# Patient Record
Sex: Female | Born: 1990 | Race: White | Hispanic: No | Marital: Single | Smoking: Former smoker
Health system: Southern US, Community
[De-identification: ages and names within clinical notes are randomized; demographics above are authoritative.]

## PROBLEM LIST (undated history)

## (undated) DIAGNOSIS — O1490 Unspecified pre-eclampsia, unspecified trimester: Secondary | ICD-10-CM

## (undated) DIAGNOSIS — M069 Rheumatoid arthritis, unspecified: Secondary | ICD-10-CM

## (undated) HISTORY — DX: Rheumatoid arthritis, unspecified: M06.9

## (undated) HISTORY — DX: Unspecified pre-eclampsia, unspecified trimester: O14.90

---
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
## 2011-07-26 ENCOUNTER — Emergency Department (HOSPITAL_COMMUNITY)
Admission: EM | Admit: 2011-07-26 | Discharge: 2011-07-26 | Disposition: A | Discharge status: Home / Self Care | Payer: BC Managed Care – PPO | Attending: Emergency Medicine | Admitting: Emergency Medicine

## 2011-07-26 ENCOUNTER — Encounter: Payer: Self-pay | Admitting: Emergency Medicine

## 2011-07-26 DIAGNOSIS — T148 Other injury of unspecified body region: Secondary | ICD-10-CM | POA: Insufficient documentation

## 2011-07-26 DIAGNOSIS — W57XXXA Bitten or stung by nonvenomous insect and other nonvenomous arthropods, initial encounter: Secondary | ICD-10-CM | POA: Insufficient documentation

## 2011-07-26 MED ORDER — HYDROCORTISONE 1 % EX CREA: TOPICAL_CREAM | CUTANEOUS | Status: AC

## 2011-07-26 MED ORDER — DIPHENHYDRAMINE HCL 25 MG PO TABS: 25.0000 mg | ORAL_TABLET | Freq: Four times a day (QID) | ORAL | Status: AC

## 2011-07-26 NOTE — ED Notes (Signed)
Pt works in [employer], complaining of bug bites all over x 3 weeks ago.

## 2011-07-26 NOTE — ED Provider Notes (Signed)
History   This chart was scribed for Glynn Octave, MD by Clarita Crane. The patient was seen in room APA19/APA19 and the patient's care was started at 7:37AM.   CSN: [id number] Arrival date & time: 07/26/2011  6:48 AM  Chief Complaint  Patient presents with  . Insect Bite    (Consider location/radiation/quality/duration/timing/severity/associated sxs/prior treatment) HPI Kelly Campbell is a 20 y.o. female who presents to the Emergency Department complaining of diffuse scattered lesions described as insect bites with associated pruritus onset several weeks ago and persistent since. Denies fever, cough, SOB and recent new exposures. Notes lesions and pruritus not relieved with use of benadryl and calamine lotion. Patient reports she lives alone but works in a [employer] with people that have experienced similar symptoms. Reports that she is covered from head to toe at work except for not wearing gloves.   History reviewed. No pertinent past medical history.  History reviewed. No pertinent past surgical history.  No family history on file.  History  Substance Use Topics  . Smoking status: Never Smoker   . Smokeless tobacco: Not on file  . Alcohol Use: No    OB History    Grav Para Term Preterm Abortions TAB SAB Ect Mult Living                  Review of Systems 10 Systems reviewed and are negative for acute change except as noted in the HPI.  Allergies  Penicillins  Home Medications  No current outpatient prescriptions on file.  BP 127/79  Pulse 85  Temp(Src) 98 F (36.7 C) (Oral)  Resp 18  Ht 5\' 7"  (1.702 m)  Wt 164 lb (74.39 kg)  BMI 25.69 kg/m2  SpO2 98%  LMP 07/10/2011  Physical Exam  Nursing note and vitals reviewed. Constitutional: She is oriented to person, place, and time. She appears well-developed and well-nourished. No distress.  HENT:  Head: Normocephalic and atraumatic.  Mouth/Throat: Oropharynx is clear and moist.  Eyes: EOM are normal.  Pupils are equal, round, and reactive to light.  Neck: Neck supple. No tracheal deviation present.  Cardiovascular: Normal rate and regular rhythm.  Exam reveals no friction rub.   No murmur heard. Pulmonary/Chest: Effort normal and breath sounds normal. No respiratory distress. She has no wheezes.  Abdominal: Soft. She exhibits no distension. There is no tenderness.  Musculoskeletal: Normal range of motion. She exhibits no edema.  Neurological: She is alert and oriented to person, place, and time. No sensory deficit.  Skin: Skin is warm and dry.       Diffuse scattered erythematous papules concentrated to feet, hands, back and neck with area of excoriation to left foot. No lesions to web space of bilateral hands.   Psychiatric: She has a normal mood and affect. Her behavior is normal.    ED Course  Procedures (including critical care time)  DIAGNOSTIC STUDIES: Oxygen Saturation is 98% on room air, normal by my interpretation.    COORDINATION OF CARE:   Labs Reviewed - No data to display No results found.   No diagnosis found.    MDM  Scattered erythematous papules with excoriation.  No burrows or tracts or intertriginous areas to suggest scabies.  No mucus membrane or genital involvement. Consistent with insect bites, possible bed bugs or "cotton mite" per patient.  Will treat with antihistamines and steroid cream. Patient instructed to wash clothing and bedding in hot water.    I personally performed the services described in this documentation,  which was scribed in my presence.  The recorded information has been reviewed and considered.    Glynn Octave, MD 07/26/11 [phone]

## 2015-12-25 ENCOUNTER — Encounter: Payer: Self-pay | Admitting: Family Medicine

## 2015-12-25 ENCOUNTER — Ambulatory Visit (INDEPENDENT_AMBULATORY_CARE_PROVIDER_SITE_OTHER): Payer: BLUE CROSS/BLUE SHIELD | Admitting: Family Medicine

## 2015-12-25 VITALS — BP 130/88 | HR 87 | Temp 97.8°F | Ht 67.0 in | Wt 182.8 lb

## 2015-12-25 DIAGNOSIS — Z30018 Encounter for initial prescription of other contraceptives: Secondary | ICD-10-CM | POA: Diagnosis not present

## 2015-12-25 MED ORDER — ETONOGESTREL-ETHINYL ESTRADIOL 0.12-0.015 MG/24HR VA RING: 1.0000 | VAGINAL_RING | VAGINAL | Status: DC

## 2015-12-25 NOTE — Progress Notes (Signed)
BP 130/88 mmHg  Pulse 87  Temp(Src) 97.8 F (36.6 C) (Oral)  Ht 5\' 7"  (1.702 m)  Wt 182 lb 12.8 oz (82.918 kg)  BMI 28.62 kg/m2  LMP 12/23/2015   Subjective:    Patient ID: Kelly Campbell, female    DOB: September 11, 1991, 25 y.o.   MRN: [id number]  HPI: Kelly Campbell is a 25 y.o. female presenting on 12/25/2015 for Establish Care   HPI Birth control continuation and counseling Patient is a 25 year old G1 P1 who presents today for continuation of her birth control and to establish care with [place]. She has recently moved back to the area was getting her NuvaRing from her previous provider. She just finished her last NuvaRing and had her period on Saturday which then finished bleeding on Tuesday. She has been on the NuvaRing for a couple years now since her child was born who is now almost 53 years old. She is very satisfied with the NuvaRing and denies any major side effects from it. She is married currently and does not have any other partners.  Relevant past medical, surgical, family and social history reviewed and updated as indicated. Interim medical history since our last visit reviewed. Allergies and medications reviewed and updated.  Review of Systems  Constitutional: Negative for fever and chills.  HENT: Negative for congestion, ear discharge and ear pain.   Eyes: Negative for redness and visual disturbance.  Respiratory: Negative for cough, chest tightness and shortness of breath.   Cardiovascular: Negative for chest pain, palpitations and leg swelling.  Genitourinary: Negative for dysuria, frequency, difficulty urinating, menstrual problem and pelvic pain.  Musculoskeletal: Negative for back pain and gait problem.  Skin: Negative for rash.  Neurological: Negative for dizziness, light-headedness and headaches.  Psychiatric/Behavioral: Negative for behavioral problems and agitation.  All other systems reviewed and are negative.   Per HPI unless specifically indicated  above  Social History   Social History  . Marital Status: Single    Spouse Name: N/A  . Number of Children: N/A  . Years of Education: N/A   Occupational History  . Not on file.   Social History Main Topics  . Smoking status: Current Every Day Smoker -- 0.50 packs/day for 5 years    Types: Cigarettes  . Smokeless tobacco: Never Used  . Alcohol Use: No  . Drug Use: No  . Sexual Activity: Yes    Birth Control/ Protection: Inserts   Other Topics Concern  . Not on file   Social History Narrative    Past Surgical History  Procedure Laterality Date  . Cesarean section      Family History  Problem Relation Age of Onset  . Diabetes Mother   . Asthma Mother   . Asthma Sister       Medication List       This list is accurate as of: 12/25/15 11:09 AM.  Always use your most recent med list.               etonogestrel-ethinyl estradiol 0.12-0.015 MG/24HR vaginal ring  Commonly known as:  Henderson 1 each vaginally every 28 (twenty-eight) days. Insert vaginally and leave in place for 3 consecutive weeks, then remove for 1 week.           Objective:    BP 130/88 mmHg  Pulse 87  Temp(Src) 97.8 F (36.6 C) (Oral)  Ht 5\' 7"  (1.702 m)  Wt 182 lb 12.8 oz (82.918 kg)  BMI 28.62 kg/m2  LMP  12/23/2015  Wt Readings from Last 3 Encounters:  12/25/15 182 lb 12.8 oz (82.918 kg)  07/26/11 164 lb (74.39 kg) (90 %*, Z = 1.26)   * Growth percentiles are based on CDC 2-20 Years data.    Physical Exam  Constitutional: She is oriented to person, place, and time. She appears well-developed and well-nourished. No distress.  Eyes: Conjunctivae and EOM are normal. Pupils are equal, round, and reactive to light.  Neck: Neck supple. No thyromegaly present.  Cardiovascular: Normal rate, regular rhythm, normal heart sounds and intact distal pulses.   No murmur heard. Pulmonary/Chest: Effort normal and breath sounds normal. No respiratory distress. She has no wheezes.   Abdominal: Soft. Bowel sounds are normal. She exhibits no distension. There is no tenderness. There is no rebound.  Musculoskeletal: Normal range of motion. She exhibits no edema or tenderness.  Lymphadenopathy:    She has no cervical adenopathy.  Neurological: She is alert and oriented to person, place, and time. Coordination normal.  Skin: Skin is warm and dry. No rash noted. She is not diaphoretic.  Psychiatric: She has a normal mood and affect. Her behavior is normal.  Nursing note and vitals reviewed.   Results for orders placed or performed during the hospital encounter of 08/05/10  Urine culture  Result Value Ref Range   Specimen Description URINE, CLEAN CATCH    Special Requests NONE    Culture  Setup Time [id number]    Colony Count >=100,000 COLONIES/ML    Culture      STAPHYLOCOCCUS SPECIES (COAGULASE NEGATIVE) Note: RIFAMPIN AND GENTAMICIN SHOULD NOT BE USED AS SINGLE DRUGS FOR TREATMENT OF STAPH INFECTIONS.   Report Status 08/08/2010 FINAL    Organism ID, Bacteria STAPHYLOCOCCUS SPECIES (COAGULASE NEGATIVE)       Susceptibility   Staphylococcus species (coagulase negative) - MIC    GENTAMICIN <=0.5 Sensitive     LEVOFLOXACIN 0.5 Sensitive     NITROFURANTOIN <=16 Sensitive     OXACILLIN SENSITIVE      PENICILLIN 0.25 Resistant     RIFAMPIN <=0.5 Sensitive     VANCOMYCIN <=0.5 Sensitive     TETRACYCLINE <=1 Sensitive   Pregnancy, urine  Result Value Ref Range   Preg Test, Ur      NEGATIVE        THE SENSITIVITY OF THIS METHODOLOGY IS >24 mIU/mL  Urinalysis, Routine w reflex microscopic  Result Value Ref Range   Color, Urine YELLOW YELLOW   APPearance HAZY (A) CLEAR   Specific Gravity, Urine 1.025 1.005 - 1.030   pH 6.5 5.0 - 8.0   Glucose, UA NEGATIVE NEGATIVE mg/dL   Hgb urine dipstick LARGE (A) NEGATIVE   Bilirubin Urine NEGATIVE NEGATIVE   Ketones, ur NEGATIVE NEGATIVE mg/dL   Protein, ur TRACE (A) NEGATIVE mg/dL   Urobilinogen, UA 1.0 0.0 - 1.0 mg/dL    Nitrite NEGATIVE NEGATIVE   Leukocytes, UA SMALL (A) NEGATIVE  Urine microscopic-add on  Result Value Ref Range   Squamous Epithelial / LPF MANY (A) RARE   WBC, UA 7-10 <3 WBC/hpf   RBC / HPF 21-50 <3 RBC/hpf   Bacteria, UA MANY (A) RARE      Assessment & Plan:   Problem List Items Addressed This Visit    None    Visit Diagnoses    Encounter for initial prescription of other contraceptives    -  Primary    Relevant Medications    etonogestrel-ethinyl estradiol (NUVARING) 0.12-0.015 MG/24HR vaginal ring  Follow up plan: Return in about 4 weeks (around 01/22/2016), or if symptoms worsen or fail to improve.  Caryl Pina, MD [facility] 12/25/2015, 11:09 AM

## 2016-01-08 ENCOUNTER — Encounter: Payer: Self-pay | Admitting: Family Medicine

## 2016-01-08 ENCOUNTER — Ambulatory Visit (INDEPENDENT_AMBULATORY_CARE_PROVIDER_SITE_OTHER): Payer: BLUE CROSS/BLUE SHIELD | Admitting: Family Medicine

## 2016-01-08 VITALS — BP 135/81 | HR 106 | Temp 97.5°F | Ht 67.0 in | Wt 183.8 lb

## 2016-01-08 DIAGNOSIS — J309 Allergic rhinitis, unspecified: Secondary | ICD-10-CM

## 2016-01-08 DIAGNOSIS — Z01419 Encounter for gynecological examination (general) (routine) without abnormal findings: Secondary | ICD-10-CM | POA: Diagnosis not present

## 2016-01-08 MED ORDER — PREDNISONE 20 MG PO TABS: ORAL_TABLET | ORAL | Status: DC

## 2016-01-08 NOTE — Addendum Note (Signed)
Addended by: Caryl Pina on: 01/08/2016 03:45 PM   Modules accepted: Miquel Dunn

## 2016-01-08 NOTE — Progress Notes (Addendum)
BP 135/81 mmHg  Pulse 106  Temp(Src) 97.5 F (36.4 C) (Oral)  Ht 5\' 7"  (1.702 m)  Wt 183 lb 12.8 oz (83.371 kg)  BMI 28.78 kg/m2  LMP 12/23/2015   Subjective:    Patient ID: Kelly Campbell, female    DOB: November 30, 1990, 25 y.o.   MRN: [id number]  HPI: Kelly Campbell is a 25 y.o. female presenting on 01/08/2016 for Gynecologic Exam and Sinusitis   HPI We'll woman exam and Pap Patient is coming in today for well woman exam and Pap. Her last Pap was 3 years ago right after her child was born. Her child was born C-section. She has never had any abnormal Paps. She denies any issues with her breasts such as lumps or discharge. She denies any abnormal vaginal discharge or abnormal vaginal bleeding. She is currently using the NuvaRing for birth control and is stable steady relationship.  Sinus congestion and drainage Patient has been having sinus congestion and drainage for the past 1 week since she will the lawn and was outside more. She has been using Claritin and last been helping some but is not getting rid of it. She does often gets seasonal allergies in the springtime. This is her first spring back in [place] after living up in [place]. She denies any fevers or chills or shortness of breath or wheezing. She has been having some postnasal drainage and sinus pressure and ear pressure mostly all on the right side.  Relevant past medical, surgical, family and social history reviewed and updated as indicated. Interim medical history since our last visit reviewed. Allergies and medications reviewed and updated.  Review of Systems  Constitutional: Negative for fever and chills.  HENT: Positive for congestion, postnasal drip, rhinorrhea, sinus pressure, sneezing and sore throat. Negative for ear discharge and ear pain.   Eyes: Negative for pain, redness and visual disturbance.  Respiratory: Positive for cough. Negative for chest tightness and shortness of breath.   Cardiovascular:  Negative for chest pain and leg swelling.  Genitourinary: Negative for dysuria, frequency, hematuria, flank pain, vaginal bleeding, vaginal discharge, difficulty urinating, vaginal pain, menstrual problem and pelvic pain.  Musculoskeletal: Negative for back pain and gait problem.  Skin: Negative for color change and rash.  Neurological: Negative for dizziness, light-headedness and headaches.  Psychiatric/Behavioral: Negative for behavioral problems and agitation.  All other systems reviewed and are negative.   Per HPI unless specifically indicated above     Medication List       This list is accurate as of: 01/08/16 10:44 AM.  Always use your most recent med list.               etonogestrel-ethinyl estradiol 0.12-0.015 MG/24HR vaginal ring  Commonly known as:  Amanda 1 each vaginally every 28 (twenty-eight) days. Insert vaginally and leave in place for 3 consecutive weeks, then remove for 1 week.     loratadine 10 MG tablet  Commonly known as:  CLARITIN  Take 10 mg by mouth daily.     predniSONE 20 MG tablet  Commonly known as:  DELTASONE  2 po at same time daily for 5 days           Objective:    BP 135/81 mmHg  Pulse 106  Temp(Src) 97.5 F (36.4 C) (Oral)  Ht 5\' 7"  (1.702 m)  Wt 183 lb 12.8 oz (83.371 kg)  BMI 28.78 kg/m2  LMP 12/23/2015  Wt Readings from Last 3 Encounters:  01/08/16 183  lb 12.8 oz (83.371 kg)  12/25/15 182 lb 12.8 oz (82.918 kg)  07/26/11 164 lb (74.39 kg) (90 %*, Z = 1.26)   * Growth percentiles are based on CDC 2-20 Years data.    Physical Exam  Constitutional: She is oriented to person, place, and time. She appears well-developed and well-nourished. No distress.  HENT:  Right Ear: External ear and ear canal normal. Tympanic membrane is retracted. Tympanic membrane is not injected and not erythematous. No middle ear effusion.  Left Ear: Tympanic membrane, external ear and ear canal normal.  Nose: Mucosal edema and rhinorrhea  present. No epistaxis. Right sinus exhibits maxillary sinus tenderness. Right sinus exhibits no frontal sinus tenderness. Left sinus exhibits no maxillary sinus tenderness and no frontal sinus tenderness.  Mouth/Throat: Uvula is midline and mucous membranes are normal. Posterior oropharyngeal edema and posterior oropharyngeal erythema present. No oropharyngeal exudate or tonsillar abscesses.  Eyes: Conjunctivae and EOM are normal. Pupils are equal, round, and reactive to light.  Neck: Neck supple. No thyromegaly present.  Cardiovascular: Normal rate, regular rhythm, normal heart sounds and intact distal pulses.   No murmur heard. Pulmonary/Chest: Effort normal and breath sounds normal. No respiratory distress. She has no wheezes. She exhibits no mass and no tenderness. Right breast exhibits no inverted nipple, no mass, no nipple discharge, no skin change and no tenderness. Left breast exhibits no inverted nipple, no mass, no nipple discharge, no skin change and no tenderness. Breasts are symmetrical.  Genitourinary: Uterus normal. No breast swelling, tenderness, discharge or bleeding. There is no rash, tenderness, lesion or injury on the right labia. There is no rash, tenderness, lesion or injury on the left labia. Uterus is not deviated, not enlarged, not fixed and not tender. Cervix exhibits no motion tenderness, no discharge and no friability (Visible transitional zone). Right adnexum displays no mass, no tenderness and no fullness. Left adnexum displays no mass, no tenderness and no fullness. No erythema or bleeding in the vagina. No foreign body around the vagina. No vaginal discharge found.  Musculoskeletal: Normal range of motion. She exhibits no edema or tenderness.  Lymphadenopathy:    She has no cervical adenopathy.  Neurological: She is alert and oriented to person, place, and time. Coordination normal.  Skin: Skin is warm and dry. No rash noted. She is not diaphoretic.  Psychiatric: She has  a normal mood and affect. Her behavior is normal.  Nursing note and vitals reviewed.     Assessment & Plan:   Problem List Items Addressed This Visit    None    Visit Diagnoses    Well woman exam with routine gynecological exam    -  Primary    Relevant Orders    Pap IG, CT/NG w/ reflex HPV when ASC-U    Allergic rhinitis, unspecified allergic rhinitis type        Continue Claritin and take short course of prednisone. Pickup Flonase    Relevant Medications    predniSONE (DELTASONE) 20 MG tablet       Follow up plan: Return in about 1 year (around 01/07/2017), or if symptoms worsen or fail to improve.  Counseling provided for all of the vaccine components No orders of the defined types were placed in this encounter.    Caryl Pina, MD [facility] 01/08/2016, 10:44 AM

## 2016-01-11 LAB — PAP IG, CT-NG, RFX HPV ASCU
Chlamydia, Nuc. Acid Amp: NEGATIVE
GONOCOCCUS BY NUCLEIC ACID AMP: NEGATIVE
PAP Smear Comment: 0

## 2016-07-22 ENCOUNTER — Ambulatory Visit (INDEPENDENT_AMBULATORY_CARE_PROVIDER_SITE_OTHER): Payer: BLUE CROSS/BLUE SHIELD | Admitting: Physician Assistant

## 2016-07-22 ENCOUNTER — Encounter: Payer: Self-pay | Admitting: Physician Assistant

## 2016-07-22 VITALS — BP 127/89 | HR 106 | Temp 98.5°F | Ht 67.0 in | Wt 193.6 lb

## 2016-07-22 DIAGNOSIS — F4322 Adjustment disorder with anxiety: Secondary | ICD-10-CM | POA: Diagnosis not present

## 2016-07-22 MED ORDER — ALPRAZOLAM 0.5 MG PO TABS: 0.5000 mg | ORAL_TABLET | Freq: Two times a day (BID) | ORAL | 0 refills | Status: DC | PRN

## 2016-07-22 NOTE — Patient Instructions (Signed)
  Adjustment Disorder Adjustment disorder is an unusually severe reaction to a stressful life event, such as the loss of a job or physical illness. The event may be any stressful event other than the loss of a loved one. Adjustment disorder may affect your feelings, your thinking, how you act, or a combination of these. It may interfere with personal relationships or with the way you are at work, school, or home. People with this disorder are at risk for suicide and substance abuse. They may develop a more serious mental disorder, such as major depressive disorder or post-traumatic stress disorder. SIGNS AND SYMPTOMS  Symptoms may include:  Sadness, depressed mood, or crying spells.  Loss of enjoyment.  Change in appetite or weight.  Sense of loss or hopelessness.  Thoughts of suicide.  Anxiety, worry, or nervousness.  Trouble sleeping.  Avoiding family and friends.  Poor school performance.  Fighting or vandalism.  Reckless driving.  Skipping school.  Poor work performance.  Ignoring bills. Symptoms of adjustment disorder start within 3 months of the stressful life event. They do not last more than 6 months after the event has ended. DIAGNOSIS  To make a diagnosis, your health care provider will ask about what has happened in your life and how it has affected you. He or she may also ask about your medical history and use of medicines, alcohol, and other substances. Your health care provider may do a physical exam and order lab tests or other studies. You may be referred to a mental health specialist for evaluation. TREATMENT  Treatment options include:  Counseling or talk therapy. Talk therapy is usually provided by mental health specialists.  Medicine. Certain medicines may help with depression, anxiety, and sleep.  Support groups. Support groups offer emotional support, advice, and guidance. They are made up of people who have had similar experiences. HOME CARE  INSTRUCTIONS  Keep all follow-up visits as directed by your health care provider. This is important.  Take medicines only as directed by your health care provider. SEEK MEDICAL CARE IF:  Your symptoms get worse.  SEEK IMMEDIATE MEDICAL CARE IF: You have serious thoughts about hurting yourself or someone else. MAKE SURE YOU:  Understand these instructions.  Will watch your condition.  Will get help right away if you are not doing well or get worse.   This information is not intended to replace advice given to you by your health care provider. Make sure you discuss any questions you have with your health care provider.   Document Released: 06/02/2006 Document Revised: 10/19/2014 Document Reviewed: 02/20/2014 Elsevier Interactive Patient Education 2016 [employer]  

## 2016-07-22 NOTE — Progress Notes (Signed)
BP 127/89   Pulse (!) 106   Temp 98.5 F (36.9 C) (Oral)   Ht 5\' 7"  (1.702 m)   Wt 193 lb 9.6 oz (87.8 kg)   BMI 30.32 kg/m    Subjective:    Patient ID: Kelly Campbell, female    DOB: July 23, 1991, 25 y.o.   MRN: [id number]  HPI: Kelly Campbell is a 25 y.o. female presenting on 07/22/2016 for Depression (Patients husband hung himself Sunday and wife found him right after and was able to get him down. He is currently in [facility])  Patient comes in today having anxiety related to tragedy that happened with her husband. He has suffered with long-term depression and anxiety. He is a [employer] that is inactive at this time. On Sunday he hung himself and she found him in their yard and was able to get them down. Emergency services talked her through CPR until he can get there. He has survived. He is currently on the neuro ward at [facility]. He has gotten up and walk today. At this time they have not seen any neurologic deficit that he will be dealing with.  I have had a long discussion with her concerning the need for some anxiolytics. She denies any addictive desires. She has had family members have great difficulty with addiction and does not want to go down that road at all. And I commended her on making that choice. At this time we will give her a limited supply of Xanax to use only as needed and possibly at bedtime for the next few weeks if needed. I've given her a list of local counselors. I have recommended that she have follow-up counseling for herself. And that if her anxiety or panic episodes recur that she should come back for further treatment through this office.   Relevant past medical, surgical, family and social history reviewed and updated as indicated. Interim medical history since our last visit reviewed. Allergies and medications reviewed and updated. DATA REVIEWED: CHART IN EPIC  Social History   Social History  . Marital status: Single    Spouse name: N/A  .  Number of children: N/A  . Years of education: N/A   Occupational History  . Not on file.   Social History Main Topics  . Smoking status: Current Every Day Smoker    Packs/day: 0.50    Years: 5.00    Types: Cigarettes  . Smokeless tobacco: Never Used  . Alcohol use No  . Drug use: No  . Sexual activity: Yes    Birth control/ protection: Inserts   Other Topics Concern  . Not on file   Social History Narrative  . No narrative on file    Past Surgical History:  Procedure Laterality Date  . CESAREAN SECTION      Family History  Problem Relation Age of Onset  . Diabetes Mother   . Asthma Mother   . Asthma Sister     Review of Systems  Constitutional: Negative.   HENT: Negative.   Eyes: Negative.   Respiratory: Negative.   Gastrointestinal: Negative.   Genitourinary: Negative.   Psychiatric/Behavioral: Positive for agitation, decreased concentration, dysphoric mood and sleep disturbance. Negative for self-injury and suicidal ideas. The patient is nervous/anxious. The patient is not hyperactive.       Medication List       Accurate as of 07/22/16 12:00 PM. Always use your most recent med list.  ALPRAZolam 0.5 MG tablet Commonly known as:  XANAX Take 1 tablet (0.5 mg total) by mouth 2 (two) times daily as needed for anxiety.   etonogestrel-ethinyl estradiol 0.12-0.015 MG/24HR vaginal ring Commonly known as:  [place] 1 each vaginally every 28 (twenty-eight) days. Insert vaginally and leave in place for 3 consecutive weeks, then remove for 1 week.   loratadine 10 MG tablet Commonly known as:  CLARITIN Take 10 mg by mouth daily.          Objective:    BP 127/89   Pulse (!) 106   Temp 98.5 F (36.9 C) (Oral)   Ht 5\' 7"  (1.702 m)   Wt 193 lb 9.6 oz (87.8 kg)   BMI 30.32 kg/m   Allergies  Allergen Reactions  . Penicillins     Wt Readings from Last 3 Encounters:  07/22/16 193 lb 9.6 oz (87.8 kg)  01/08/16 183 lb 12.8 oz (83.4 kg)   12/25/15 182 lb 12.8 oz (82.9 kg)    Physical Exam  Constitutional: She is oriented to person, place, and time. She appears well-developed and well-nourished.  HENT:  Head: Normocephalic and atraumatic.  Eyes: Conjunctivae and EOM are normal. Pupils are equal, round, and reactive to light.  Cardiovascular: Normal rate, regular rhythm, normal heart sounds and intact distal pulses.   Pulmonary/Chest: Effort normal and breath sounds normal.  Abdominal: Soft. Bowel sounds are normal.  Neurological: She is alert and oriented to person, place, and time. She has normal reflexes.  Skin: Skin is warm and dry. No rash noted.  Psychiatric: Her behavior is normal. Judgment and thought content normal. Her mood appears anxious. Cognition and memory are normal. She exhibits a depressed mood.        Assessment & Plan:   1. Adjustment disorder with anxious mood - ALPRAZolam (XANAX) 0.5 MG tablet; Take 1 tablet (0.5 mg total) by mouth 2 (two) times daily as needed for anxiety.  Dispense: 30 tablet; Refill: 0 Counselor/Psychiatry information given for patient to pursue counseling/  Continue all other maintenance medications as listed above.  Follow up plan: Return if symptoms worsen or fail to improve.  Recommend returning if the anxiety and depression symptoms persist for more long term treatment.  Educational handout given for adjustment disorder.  Terald Sleeper PA-C [place] [address]  [place], [place] [address] [phone]   07/22/2016, 12:00 PM

## 2016-08-12 ENCOUNTER — Ambulatory Visit (INDEPENDENT_AMBULATORY_CARE_PROVIDER_SITE_OTHER): Payer: BLUE CROSS/BLUE SHIELD | Admitting: Physician Assistant

## 2016-08-12 ENCOUNTER — Encounter: Payer: Self-pay | Admitting: Physician Assistant

## 2016-08-12 VITALS — BP 115/81 | HR 110 | Temp 98.0°F | Ht 67.0 in | Wt 194.2 lb

## 2016-08-12 DIAGNOSIS — J209 Acute bronchitis, unspecified: Secondary | ICD-10-CM | POA: Diagnosis not present

## 2016-08-12 DIAGNOSIS — J0120 Acute ethmoidal sinusitis, unspecified: Secondary | ICD-10-CM | POA: Diagnosis not present

## 2016-08-12 MED ORDER — DOXYCYCLINE HYCLATE 100 MG PO TABS: 100.0000 mg | ORAL_TABLET | Freq: Two times a day (BID) | ORAL | 0 refills | Status: DC

## 2016-08-12 MED ORDER — FLUCONAZOLE 150 MG PO TABS: 150.0000 mg | ORAL_TABLET | Freq: Once | ORAL | 0 refills | Status: AC

## 2016-08-12 MED ORDER — METHYLPREDNISOLONE ACETATE 80 MG/ML IJ SUSP
80.0000 mg | Freq: Once | INTRAMUSCULAR | Status: AC
  Administered 2016-08-12: 80 mg via INTRAMUSCULAR

## 2016-08-12 NOTE — Patient Instructions (Signed)

## 2016-08-13 NOTE — Progress Notes (Signed)
BP 115/81   Pulse (!) 110   Temp 98 F (36.7 C) (Oral)   Ht 5\' 7"  (1.702 m)   Wt 194 lb 3.2 oz (88.1 kg)   BMI 30.42 kg/m    Subjective:    Patient ID: Kelly Campbell, female    DOB: 1991-02-10, 25 y.o.   MRN: [id number]  HPI: Kelly Campbell is a 25 y.o. female presenting on 08/12/2016 for Cough; Nasal Congestion; facial pressure ; and Fever (low grade 100)  Patient has had a cold for over a week. It has built up with significant sinus pressure and drainage. She has seen some change in color to the mucus and a little bit of blood when she blows. She has had fever and chills and decreased appetite and activity.  Relevant past medical, surgical, family and social history reviewed and updated as indicated. Allergies and medications reviewed and updated.  Past Medical History:  Diagnosis Date  . Preeclampsia     Past Surgical History:  Procedure Laterality Date  . CESAREAN SECTION      Review of Systems  Constitutional: Positive for fatigue. Negative for activity change and fever.  HENT: Positive for congestion, postnasal drip and sore throat.   Eyes: Negative.   Respiratory: Positive for cough and wheezing.   Cardiovascular: Negative.   Gastrointestinal: Negative.   Genitourinary: Negative.   Neurological: Positive for headaches. Negative for syncope and light-headedness.      Medication List       Accurate as of 08/12/16 11:59 PM. Always use your most recent med list.          ALPRAZolam 0.5 MG tablet Commonly known as:  XANAX Take 1 tablet (0.5 mg total) by mouth 2 (two) times daily as needed for anxiety.   doxycycline 100 MG tablet Commonly known as:  VIBRA-TABS Take 1 tablet (100 mg total) by mouth 2 (two) times daily.   etonogestrel-ethinyl estradiol 0.12-0.015 MG/24HR vaginal ring Commonly known as:  [place] 1 each vaginally every 28 (twenty-eight) days. Insert vaginally and leave in place for 3 consecutive weeks, then remove for 1 week.     fluconazole 150 MG tablet Commonly known as:  DIFLUCAN Take 1 tablet (150 mg total) by mouth once.   loratadine 10 MG tablet Commonly known as:  CLARITIN Take 10 mg by mouth daily.          Objective:    BP 115/81   Pulse (!) 110   Temp 98 F (36.7 C) (Oral)   Ht 5\' 7"  (1.702 m)   Wt 194 lb 3.2 oz (88.1 kg)   BMI 30.42 kg/m   Allergies  Allergen Reactions  . Penicillins     Physical Exam  Constitutional: She is oriented to person, place, and time. She appears well-developed and well-nourished.  HENT:  Head: Normocephalic and atraumatic.  Right Ear: Tympanic membrane and external ear normal. No middle ear effusion.  Left Ear: Tympanic membrane and external ear normal.  No middle ear effusion.  Nose: Mucosal edema and rhinorrhea present. Right sinus exhibits no maxillary sinus tenderness. Left sinus exhibits no maxillary sinus tenderness.  Mouth/Throat: Uvula is midline. Posterior oropharyngeal erythema present.  Eyes: Conjunctivae and EOM are normal. Pupils are equal, round, and reactive to light. Right eye exhibits no discharge. Left eye exhibits no discharge.  Neck: Normal range of motion.  Cardiovascular: Normal rate, regular rhythm and normal heart sounds.   Pulmonary/Chest: Effort normal and breath sounds normal. No respiratory distress. She has no  wheezes.  Abdominal: Soft.  Lymphadenopathy:    She has no cervical adenopathy.  Neurological: She is alert and oriented to person, place, and time.  Skin: Skin is warm and dry.  Psychiatric: She has a normal mood and affect.        Assessment & Plan:   1. Acute bronchitis, unspecified organism - methylPREDNISolone acetate (DEPO-MEDROL) injection 80 mg; Inject 1 mL (80 mg total) into the muscle once. - doxycycline (VIBRA-TABS) 100 MG tablet; Take 1 tablet (100 mg total) by mouth 2 (two) times daily.  Dispense: 20 tablet; Refill: 0 - fluconazole (DIFLUCAN) 150 MG tablet; Take 1 tablet (150 mg total) by mouth once.   Dispense: 1 tablet; Refill: 0  2. Acute non-recurrent ethmoidal sinusitis - doxycycline (VIBRA-TABS) 100 MG tablet; Take 1 tablet (100 mg total) by mouth 2 (two) times daily.  Dispense: 20 tablet; Refill: 0 - fluconazole (DIFLUCAN) 150 MG tablet; Take 1 tablet (150 mg total) by mouth once.  Dispense: 1 tablet; Refill: 0   Continue all other maintenance medications as listed above.  Follow up plan: Return if symptoms worsen or fail to improve.   Educational handout given for sinusitis  Terald Sleeper PA-C [place] [address]  [place], [place] [address] [phone]   08/13/2016, 3:39 PM

## 2016-10-15 ENCOUNTER — Encounter: Payer: Self-pay | Admitting: Pediatrics

## 2016-10-15 ENCOUNTER — Ambulatory Visit (INDEPENDENT_AMBULATORY_CARE_PROVIDER_SITE_OTHER): Payer: BLUE CROSS/BLUE SHIELD | Admitting: Pediatrics

## 2016-10-15 ENCOUNTER — Ambulatory Visit (INDEPENDENT_AMBULATORY_CARE_PROVIDER_SITE_OTHER): Payer: BLUE CROSS/BLUE SHIELD

## 2016-10-15 VITALS — BP 123/85 | HR 88 | Temp 98.0°F | Ht 67.0 in | Wt 202.4 lb

## 2016-10-15 DIAGNOSIS — M79671 Pain in right foot: Secondary | ICD-10-CM | POA: Diagnosis not present

## 2016-10-15 DIAGNOSIS — M7661 Achilles tendinitis, right leg: Secondary | ICD-10-CM

## 2016-10-15 DIAGNOSIS — G8929 Other chronic pain: Secondary | ICD-10-CM

## 2016-10-15 NOTE — Progress Notes (Signed)
  Subjective:   Patient ID: Kelly Campbell, female    DOB: December 02, 1990, 26 y.o.   MRN: [id number] CC: Foot Pain (right, 1 month)  HPI: Kelly Campbell is a 26 y.o. female presenting for Foot Pain (right, 1 month)  Has been having pain in back of R heel for past month Has been getting worse Hurts a lot at work, on her feet on cement floors moving a lot all day Hurts to dorsiflex her foot No known injury Has been progressive Has tried regular ibuprofne throughout the day Recently was off work for 2 weeks for the holidays, pain continued anytime she was regularly walking on heel Pain in heel with weight bearing Pain improves at rest/sitting  Relevant past medical, surgical, family and social history reviewed. Allergies and medications reviewed and updated. History  Smoking Status  . Current Every Day Smoker  . Packs/day: 0.50  . Years: 5.00  . Types: Cigarettes  Smokeless Tobacco  . Never Used   ROS: Per HPI   Objective:    BP 123/85   Pulse 88   Temp 98 F (36.7 C) (Oral)   Ht 5\' 7"  (1.702 m)   Wt 202 lb 6.4 oz (91.8 kg)   LMP 09/24/2016   BMI 31.70 kg/m   Wt Readings from Last 3 Encounters:  10/15/16 202 lb 6.4 oz (91.8 kg)  08/12/16 194 lb 3.2 oz (88.1 kg)  07/22/16 193 lb 9.6 oz (87.8 kg)    Gen: NAD, alert, cooperative with exam, NCAT EYES: EOMI, no conjunctival injection, or no icterus CV: distal pulses 2+ b/l Resp: normal WOB Neuro: Alert and oriented, sensation intact b/l feet MSK: some swelling present on either side of achilles tendon, no redness, no heat TTP over achilles insertion and along distal tendon Tenderness with heel squeeze No other point tenderness in ankle Pain in heel with dorsiflexion>plantar flexion of heel Pain with passive and active dorsiflexion  Assessment & Plan:  Kanosha was seen today for foot pain.  Diagnoses and all orders for this visit:  Heel pain, chronic, right Xray with no obvious fractures of calcaneus, will f/u final  read -     DG Foot Complete Right; Future -     Ambulatory referral to Sports Medicine  Achilles tendinitis of right lower extremity Cont NSAIDs, rest, avoid exacerbating activities Referral to sports medicine  Follow up plan: As needed Assunta Found, MD [place]

## 2016-10-15 NOTE — Patient Instructions (Addendum)
Achilles Tendinitis  Achilles tendinitis is inflammation of the tough, cord-like band that attaches the lower muscles of your leg to your heel (Achilles tendon). It is usually caused by overusing the tendon and joint involved.   CAUSES  Achilles tendinitis can happen because of:   A sudden increase in exercise or activity (such as running).   Doing the same exercises or activities (such as jumping) over and over.   Not warming up calf muscles before exercising.   Exercising in shoes that are worn out or not made for exercise.   Having arthritis or a bone growth on the back of the heel bone. This can rub against the tendon and hurt the tendon.  SIGNS AND SYMPTOMS  The most common symptoms are:   Pain in the back of the leg, just above the heel. The pain usually gets worse with exercise and better with rest.   Stiffness or soreness in the back of the leg, especially in the morning.   Swelling of the skin over the Achilles tendon.   Trouble standing on tiptoe.  Sometimes, an Achilles tendon tears (ruptures). Symptoms of an Achilles tendon rupture can include:   Sudden, severe pain in the back of the leg.   Trouble putting weight on the foot or walking normally.  DIAGNOSIS  Achilles tendinitis will be diagnosed based on symptoms and a physical examination. An X-ray may be done to check if another condition is causing your symptoms. An MRI may be ordered if your health care provider suspects you may have completely torn your tendon, which is called an Achilles tendon rupture.   TREATMENT   Achilles tendinitis usually gets better over time. It can take weeks to months to heal completely. Treatment focuses on treating the symptoms and helping the injury heal.  HOME CARE INSTRUCTIONS    Rest your Achilles tendon and avoid activities that cause pain.   Apply ice to the injured area:    Put ice in a plastic bag.    Place a towel between your skin and the bag.    Leave the ice on for 20 minutes, 2-3 times a  day   Try to avoid using the tendon (other than gentle range of motion) while the tendon is painful. Do not resume use until instructed by your health care provider. Then begin use gradually. Do not increase use to the point of pain. If pain does develop, decrease use and continue the above measures. Gradually increase activities that do not cause discomfort until you achieve normal use.   Do exercises to make your calf muscles stronger and more flexible. Your health care provider or physical therapist can recommend exercises for you to do.   Wrap your ankle with an elastic bandage or other wrap. This can help keep your tendon from moving too much. Your health care provider will show you how to wrap your ankle correctly.   Only take over-the-counter or prescription medicines for pain, discomfort, or fever as directed by your health care provider.  SEEK MEDICAL CARE IF:    Your pain and swelling increase or pain is uncontrolled with medicines.   You develop new, unexplained symptoms or your symptoms get worse.   You are unable to move your toes or foot.   You develop warmth and swelling in your foot.   You have an unexplained temperature.  MAKE SURE YOU:    Understand these instructions.   Will watch your condition.   Will get help right away   if you are not doing well or get worse.     This information is not intended to replace advice given to you by your health care provider. Make sure you discuss any questions you have with your health care provider.     Document Released: 07/08/2005 Document Revised: 10/19/2014 Document Reviewed: 05/10/2013  Elsevier Interactive Patient Education 2017 [employer]

## 2017-01-15 ENCOUNTER — Encounter: Payer: Self-pay | Admitting: Family Medicine

## 2017-01-15 ENCOUNTER — Ambulatory Visit (INDEPENDENT_AMBULATORY_CARE_PROVIDER_SITE_OTHER): Payer: BLUE CROSS/BLUE SHIELD | Admitting: Family Medicine

## 2017-01-15 VITALS — BP 122/76 | HR 70 | Temp 98.0°F | Ht 67.0 in | Wt 199.0 lb

## 2017-01-15 DIAGNOSIS — F329 Major depressive disorder, single episode, unspecified: Secondary | ICD-10-CM

## 2017-01-15 DIAGNOSIS — Z3044 Encounter for surveillance of vaginal ring hormonal contraceptive device: Secondary | ICD-10-CM

## 2017-01-15 DIAGNOSIS — F419 Anxiety disorder, unspecified: Secondary | ICD-10-CM

## 2017-01-15 DIAGNOSIS — Z01419 Encounter for gynecological examination (general) (routine) without abnormal findings: Secondary | ICD-10-CM | POA: Diagnosis not present

## 2017-01-15 DIAGNOSIS — F32A Depression, unspecified: Secondary | ICD-10-CM

## 2017-01-15 DIAGNOSIS — F4310 Post-traumatic stress disorder, unspecified: Secondary | ICD-10-CM

## 2017-01-15 DIAGNOSIS — F339 Major depressive disorder, recurrent, unspecified: Secondary | ICD-10-CM | POA: Insufficient documentation

## 2017-01-15 MED ORDER — ALPRAZOLAM 0.5 MG PO TABS: 0.5000 mg | ORAL_TABLET | Freq: Two times a day (BID) | ORAL | 0 refills | Status: DC | PRN

## 2017-01-15 MED ORDER — ESCITALOPRAM OXALATE 10 MG PO TABS
10.0000 mg | ORAL_TABLET | Freq: Every day | ORAL | 5 refills | Status: DC
Start: 2017-01-15 — End: 2017-08-20

## 2017-01-15 MED ORDER — ETONOGESTREL-ETHINYL ESTRADIOL 0.12-0.015 MG/24HR VA RING: 1.0000 | VAGINAL_RING | VAGINAL | 11 refills | Status: DC

## 2017-01-15 NOTE — Progress Notes (Signed)
BP 122/76   Pulse 70   Temp 98 F (36.7 C) (Oral)   Ht 5\' 7"  (1.702 m)   Wt 199 lb (90.3 kg)   LMP 01/01/2017   BMI 31.17 kg/m    Subjective:    Patient ID: Kelly Campbell, female    DOB: 1991-01-23, 26 y.o.   MRN: [id number]  HPI: Kelly Campbell is a 26 y.o. female presenting on 01/15/2017 for Gynecologic Exam and Anxiety   HPI Well woman exam with Pap  Patient is coming in today to get her annual well woman exam with Pap smear. She is currently using NuvaRing for contraception. She feels like she is having regular periods associated with that has nothing abnormal in no intense pain and denies any vaginal discharge or bleeding or irritation. She does do self breast exams and has not noticed any lumps or discharge or abnormalities in her breasts. Patient denies any chest pain, shortness of breath, headaches or vision issues, abdominal complaints, diarrhea, nausea, vomiting, or joint issues.   Anxiety and depression Patient is coming in for anxiety and depression and likely posttraumatic stress. About 6 months ago she found her husband had hung himself and she cut them down from the knee was administered CPR and brought him back until EMS arrived. Her husband live through the event and is seeing counseling and being treated for this but she constantly feels like she is stressed and worried and if he doesn't text back or doesn't answer that she worries about something happening and it keeps her up from sleeping because she has nightmares about it and just cannot relax her get calm throughout the situation. She did get some alprazolam to help through the panic attacks but she is struggling a day-to-day basis especially when she goes to work or is away from her husband and doesn't know where he is what he is doing.  Relevant past medical, surgical, family and social history reviewed and updated as indicated. Interim medical history since our last visit reviewed. Allergies and medications  reviewed and updated.  Review of Systems  Constitutional: Negative for chills and fever.  Respiratory: Negative for chest tightness and shortness of breath.   Cardiovascular: Negative for chest pain and leg swelling.  Genitourinary: Negative for difficulty urinating and dysuria.  Musculoskeletal: Negative for back pain and gait problem.  Skin: Negative for rash.  Neurological: Negative for light-headedness and headaches.  Psychiatric/Behavioral: Positive for decreased concentration, dysphoric mood and sleep disturbance. Negative for agitation, behavioral problems, self-injury and suicidal ideas. The patient is nervous/anxious.   All other systems reviewed and are negative.   Per HPI unless specifically indicated above      Objective:    BP 122/76   Pulse 70   Temp 98 F (36.7 C) (Oral)   Ht 5\' 7"  (1.702 m)   Wt 199 lb (90.3 kg)   LMP 01/01/2017   BMI 31.17 kg/m   Wt Readings from Last 3 Encounters:  01/15/17 199 lb (90.3 kg)  10/15/16 202 lb 6.4 oz (91.8 kg)  08/12/16 194 lb 3.2 oz (88.1 kg)    Physical Exam  Constitutional: She is oriented to person, place, and time. She appears well-developed and well-nourished. No distress.  Eyes: Conjunctivae are normal.  Neck: Neck supple. No thyromegaly present.  Cardiovascular: Normal rate, regular rhythm, normal heart sounds and intact distal pulses.   No murmur heard. Pulmonary/Chest: Effort normal and breath sounds normal. No respiratory distress. She has no wheezes. She has no rales.  Abdominal: Soft. Bowel sounds are normal. She exhibits no distension. There is no tenderness. There is no rebound and no guarding.  Musculoskeletal: Normal range of motion. She exhibits no edema or tenderness.  Lymphadenopathy:    She has no cervical adenopathy.  Neurological: She is alert and oriented to person, place, and time. Coordination normal.  Skin: Skin is warm and dry. No rash noted. She is not diaphoretic.  Psychiatric: Her speech is  normal and behavior is normal. Judgment and thought content normal. Her mood appears anxious. Cognition and memory are normal. She exhibits a depressed mood. She expresses no suicidal ideation. She expresses no suicidal plans.  Nursing note and vitals reviewed.     Assessment & Plan:   Problem List Items Addressed This Visit      Other   Anxiety and depression   Relevant Medications   escitalopram (LEXAPRO) 10 MG tablet   ALPRAZolam (XANAX) 0.5 MG tablet    Other Visit Diagnoses    Well woman exam with routine gynecological exam    -  Primary   Relevant Orders   Pap IG, CT/NG w/ reflex HPV when ASC-U   Encounter for surveillance of vaginal ring hormonal contraceptive device       Relevant Medications   etonogestrel-ethinyl estradiol (NUVARING) 0.12-0.015 MG/24HR vaginal ring   PTSD (post-traumatic stress disorder)       Relevant Medications   escitalopram (LEXAPRO) 10 MG tablet   ALPRAZolam (XANAX) 0.5 MG tablet       Follow up plan: Return in about 4 weeks (around 02/12/2017), or if symptoms worsen or fail to improve, for Anxiety and depression recheck.  Counseling provided for all of the vaccine components No orders of the defined types were placed in this encounter.   Caryl Pina, MD [facility] 01/15/2017, 10:52 AM

## 2017-01-22 LAB — PAP IG, CT-NG, RFX HPV ASCU
CHLAMYDIA, NUC. ACID AMP: NEGATIVE
Gonococcus by Nucleic Acid Amp: NEGATIVE
PAP SMEAR COMMENT: 0

## 2017-02-17 ENCOUNTER — Ambulatory Visit (INDEPENDENT_AMBULATORY_CARE_PROVIDER_SITE_OTHER): Payer: BLUE CROSS/BLUE SHIELD | Admitting: Family Medicine

## 2017-02-17 ENCOUNTER — Encounter: Payer: Self-pay | Admitting: Family Medicine

## 2017-02-17 VITALS — BP 130/89 | HR 81 | Temp 98.0°F | Ht 67.0 in | Wt 196.2 lb

## 2017-02-17 DIAGNOSIS — F419 Anxiety disorder, unspecified: Secondary | ICD-10-CM | POA: Diagnosis not present

## 2017-02-17 DIAGNOSIS — F32A Depression, unspecified: Secondary | ICD-10-CM

## 2017-02-17 DIAGNOSIS — F329 Major depressive disorder, single episode, unspecified: Secondary | ICD-10-CM

## 2017-02-17 NOTE — Progress Notes (Signed)
BP 130/89   Pulse 81   Temp 98 F (36.7 C) (Oral)   Ht 5\' 7"  (1.702 m)   Wt 196 lb 3.2 oz (89 kg)   BMI 30.73 kg/m    Subjective:    Patient ID: Kelly Campbell, female    DOB: 11-Feb-1991, 26 y.o.   MRN: [id number]  HPI: Kelly Campbell is a 26 y.o. female presenting on 02/17/2017 for Anxiety (Patient states that it has improved ) and Depression   HPI Anxiety and depression Patient has anxiety and depression is coming in for recheck today. She has been doing a lot better until last 5 days with one of her friends committed suicide. She has started to talk to a friend and is trying to get into counseling. She feels like the medications were doing very well for her until the last few days and wants to continue at the dose that she was at. She denies any suicidal ideations or thoughts of hurting herself. She has had some dreams because her friend's suicide brought back up the feelings that she had when her husband attempted suicide in a month and a half ago.  Relevant past medical, surgical, family and social history reviewed and updated as indicated. Interim medical history since our last visit reviewed. Allergies and medications reviewed and updated.  Review of Systems  Constitutional: Negative for chills and fever.  Respiratory: Negative for chest tightness and shortness of breath.   Cardiovascular: Negative for chest pain and leg swelling.  Genitourinary: Negative for difficulty urinating and dysuria.  Musculoskeletal: Negative for back pain and gait problem.  Skin: Negative for rash.  Neurological: Negative for light-headedness and headaches.  Psychiatric/Behavioral: Positive for decreased concentration, dysphoric mood and sleep disturbance. Negative for agitation, behavioral problems, self-injury and suicidal ideas. The patient is nervous/anxious.   All other systems reviewed and are negative.   Per HPI unless specifically indicated above     Objective:    BP 130/89   Pulse  81   Temp 98 F (36.7 C) (Oral)   Ht 5\' 7"  (1.702 m)   Wt 196 lb 3.2 oz (89 kg)   BMI 30.73 kg/m   Wt Readings from Last 3 Encounters:  02/17/17 196 lb 3.2 oz (89 kg)  01/15/17 199 lb (90.3 kg)  10/15/16 202 lb 6.4 oz (91.8 kg)    Physical Exam  Constitutional: She is oriented to person, place, and time. She appears well-developed and well-nourished. No distress.  Eyes: Conjunctivae are normal.  Cardiovascular: Normal rate, regular rhythm, normal heart sounds and intact distal pulses.   No murmur heard. Pulmonary/Chest: Effort normal and breath sounds normal. No respiratory distress. She has no wheezes.  Musculoskeletal: Normal range of motion. She exhibits no edema or tenderness.  Neurological: She is alert and oriented to person, place, and time. Coordination normal.  Skin: Skin is warm and dry. No rash noted. She is not diaphoretic.  Psychiatric: Her behavior is normal. Judgment normal. Her mood appears anxious. She exhibits a depressed mood. She expresses no suicidal ideation. She expresses no suicidal plans.  Nursing note and vitals reviewed.     Assessment & Plan:   Problem List Items Addressed This Visit      Other   Anxiety and depression - Primary    Was doing a lot better until one of her friends committed suicide 5 days ago. Her husband is doing a lot better and has no further suicide attempts. She feels like the medication is a good  level for her and wants to continue forward with it.          Follow up plan: Return in about 3 months (around 05/20/2017), or if symptoms worsen or fail to improve, for Anxiety depression recheck.  Counseling provided for all of the vaccine components No orders of the defined types were placed in this encounter.   Caryl Pina, MD [facility] 02/17/2017, 8:18 AM

## 2017-02-17 NOTE — Assessment & Plan Note (Signed)
Was doing a lot better until one of her friends committed suicide 5 days ago. Her husband is doing a lot better and has no further suicide attempts. She feels like the medication is a good level for her and wants to continue forward with it.

## 2017-05-20 ENCOUNTER — Encounter: Payer: Self-pay | Admitting: Family Medicine

## 2017-05-20 ENCOUNTER — Ambulatory Visit (INDEPENDENT_AMBULATORY_CARE_PROVIDER_SITE_OTHER): Payer: BLUE CROSS/BLUE SHIELD | Admitting: Family Medicine

## 2017-05-20 VITALS — BP 133/76 | HR 74 | Temp 97.2°F | Ht 67.0 in | Wt 201.0 lb

## 2017-05-20 DIAGNOSIS — F32A Depression, unspecified: Secondary | ICD-10-CM

## 2017-05-20 DIAGNOSIS — F4310 Post-traumatic stress disorder, unspecified: Secondary | ICD-10-CM

## 2017-05-20 DIAGNOSIS — F419 Anxiety disorder, unspecified: Secondary | ICD-10-CM

## 2017-05-20 DIAGNOSIS — F329 Major depressive disorder, single episode, unspecified: Secondary | ICD-10-CM | POA: Diagnosis not present

## 2017-05-20 NOTE — Progress Notes (Signed)
BP 133/76   Pulse 74   Temp (!) 97.2 F (36.2 C) (Oral)   Ht 5\' 7"  (1.702 m)   Wt 201 lb (91.2 kg)   BMI 31.48 kg/m    Subjective:    Patient ID: Kelly Campbell, female    DOB: 1991/08/27, 26 y.o.   MRN: [id number]  HPI: Kelly Campbell is a 26 y.o. female presenting on 05/20/2017 for Follow-up (pt here for routine follow up on her depression and anxiety)   HPI Anxiety and Depression Patient is coming in for anxiety and depression recheck. 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 She is currently on Lexapro and the occasional alprazolam and says she is doing very well on this. She denies any suicidal ideations or thoughts of hurting herself. She is feeling a lot more positive and happy about life and doing better in general. She is sleeping well at night as well. Depression screen [facility] 2/9 05/20/2017 02/17/2017 01/15/2017 10/15/2016 08/12/2016  Decreased Interest 0 0 1 0 2  Down, Depressed, Hopeless 0 1 1 0 1  PHQ - 2 Score 0 1 2 0 3  Altered sleeping - - 1 - 2  Tired, decreased energy - - 1 - 2  Change in appetite - - 1 - 1  Feeling bad or failure about yourself  - - 0 - 0  Trouble concentrating - - 1 - 2  Moving slowly or fidgety/restless - - 1 - 1  Suicidal thoughts - - 0 - 0  PHQ-9 Score - - 7 - 11  Difficult doing work/chores - - Somewhat difficult - -     Relevant past medical, surgical, family and social history reviewed and updated as indicated. Interim medical history since our last visit reviewed. Allergies and medications reviewed and updated.  Review of Systems  Constitutional: Negative for chills and fever.  Eyes: Negative for visual disturbance.  Respiratory: Negative for chest tightness and shortness of breath.   Cardiovascular: Negative for chest pain and leg swelling.  Musculoskeletal: Negative for back pain and gait problem.  Skin: Negative for rash.  Neurological: Negative for light-headedness and headaches.  Psychiatric/Behavioral: Positive  for decreased concentration and dysphoric mood. Negative for agitation, behavioral problems, self-injury, sleep disturbance and suicidal ideas. The patient is nervous/anxious.   All other systems reviewed and are negative.   Per HPI unless specifically indicated above      Objective:    BP 133/76   Pulse 74   Temp (!) 97.2 F (36.2 C) (Oral)   Ht 5\' 7"  (1.702 m)   Wt 201 lb (91.2 kg)   BMI 31.48 kg/m   Wt Readings from Last 3 Encounters:  05/20/17 201 lb (91.2 kg)  02/17/17 196 lb 3.2 oz (89 kg)  01/15/17 199 lb (90.3 kg)    Physical Exam  Constitutional: She is oriented to person, place, and time. She appears well-developed and well-nourished. No distress.  Eyes: Conjunctivae are normal.  Cardiovascular: Normal rate, regular rhythm, normal heart sounds and intact distal pulses.   No murmur heard. Pulmonary/Chest: Effort normal and breath sounds normal. No respiratory distress. She has no wheezes. She has no rales.  Musculoskeletal: Normal range of motion. She exhibits no edema or tenderness.  Neurological: She is alert and oriented to person, place, and time. Coordination normal.  Skin: Skin is warm and dry. No rash noted. She is not diaphoretic.  Psychiatric: Her behavior is normal. Judgment normal. Her mood appears anxious. She exhibits a depressed  mood. She expresses no suicidal ideation. She expresses no suicidal plans.  Nursing note and vitals reviewed.       Assessment & Plan:   Problem List Items Addressed This Visit      Other   Anxiety and depression - Primary    Other Visit Diagnoses    PTSD (post-traumatic stress disorder)          Continue current medications, doing well.  Follow up plan: Return in about 6 months (around 11/20/2017), or if symptoms worsen or fail to improve, for Anxiety depression and PTSD.  Counseling provided for all of the vaccine components No orders of the defined types were placed in this encounter.   Caryl Pina,  MD [facility] 05/20/2017, 8:31 AM

## 2017-08-20 ENCOUNTER — Other Ambulatory Visit: Payer: Self-pay | Admitting: Family Medicine

## 2017-08-20 DIAGNOSIS — F329 Major depressive disorder, single episode, unspecified: Secondary | ICD-10-CM

## 2017-08-20 DIAGNOSIS — F4310 Post-traumatic stress disorder, unspecified: Secondary | ICD-10-CM

## 2017-08-20 DIAGNOSIS — F32A Depression, unspecified: Secondary | ICD-10-CM

## 2017-08-20 DIAGNOSIS — F419 Anxiety disorder, unspecified: Principal | ICD-10-CM

## 2017-11-16 ENCOUNTER — Encounter: Payer: Self-pay | Admitting: Family Medicine

## 2017-11-16 ENCOUNTER — Ambulatory Visit: Payer: BLUE CROSS/BLUE SHIELD | Admitting: Family Medicine

## 2017-11-16 VITALS — BP 137/81 | HR 96 | Temp 98.2°F | Ht 67.0 in | Wt 222.0 lb

## 2017-11-16 DIAGNOSIS — J0120 Acute ethmoidal sinusitis, unspecified: Secondary | ICD-10-CM | POA: Diagnosis not present

## 2017-11-16 DIAGNOSIS — F339 Major depressive disorder, recurrent, unspecified: Secondary | ICD-10-CM | POA: Diagnosis not present

## 2017-11-16 DIAGNOSIS — F4310 Post-traumatic stress disorder, unspecified: Secondary | ICD-10-CM | POA: Diagnosis not present

## 2017-11-16 MED ORDER — ESCITALOPRAM OXALATE 10 MG PO TABS: 10.0000 mg | ORAL_TABLET | Freq: Every day | ORAL | 1 refills | Status: DC

## 2017-11-16 MED ORDER — PREDNISONE 20 MG PO TABS: ORAL_TABLET | ORAL | 0 refills | Status: DC

## 2017-11-16 NOTE — Progress Notes (Signed)
BP 137/81   Pulse 96   Temp 98.2 F (36.8 C) (Oral)   Ht 5\' 7"  (1.702 m)   Wt 222 lb (100.7 kg)   BMI 34.77 kg/m    Subjective:    Patient ID: Kelly Campbell, female    DOB: 13-Jul-1991, 27 y.o.   MRN: [id number]  HPI: Kelly Campbell is a 27 y.o. female presenting on 11/16/2017 for Depression/anxiety (follow up) and Right ear pain   HPI Depression and anxiety recheck Patient is coming in with depression and anxiety and recheck for PTSD.  Patient is currently on Lexapro 10 mg and says she is doing very well on the Lexapro 10 mg and denies any thoughts of suicide or thoughts of hurting herself and is very happy with where she is at.  She denies any feelings of depression or major anxiety and is feeling the best that she is felt in quite some time Depression screen [facility] 2/9 11/16/2017 05/20/2017 02/17/2017 01/15/2017 10/15/2016  Decreased Interest 1 0 0 1 0  Down, Depressed, Hopeless 0 0 1 1 0  PHQ - 2 Score 1 0 1 2 0  Altered sleeping - - - 1 -  Tired, decreased energy - - - 1 -  Change in appetite - - - 1 -  Feeling bad or failure about yourself  - - - 0 -  Trouble concentrating - - - 1 -  Moving slowly or fidgety/restless - - - 1 -  Suicidal thoughts - - - 0 -  PHQ-9 Score - - - 7 -  Difficult doing work/chores - - - Somewhat difficult -    Right ear pain and sinus congestion Patient has been having right ear pain and sinus congestion this been going on for about 1 week.  She denies any fevers or chills or shortness of breath or wheezing.  She has had recurrent sinus issues and just wanted it checked out to make sure she did not have any kind of infection going on.  She does admit that she gets allergies frequently.  She denies any sick contacts that she knows of.  She denies any shortness of breath or wheezing.  She has not used anything over-the-counter but has still been taking her Claritin but not consistently.  Relevant past medical, surgical, family and social history reviewed and  updated as indicated. Interim medical history since our last visit reviewed. Allergies and medications reviewed and updated.  Review of Systems  Constitutional: Negative for chills and fever.  HENT: Positive for congestion, postnasal drip, rhinorrhea, sinus pressure and sneezing. Negative for ear discharge, ear pain and sore throat.   Eyes: Negative for pain, redness and visual disturbance.  Respiratory: Positive for cough. Negative for chest tightness and shortness of breath.   Cardiovascular: Negative for chest pain and leg swelling.  Genitourinary: Negative for difficulty urinating and dysuria.  Musculoskeletal: Negative for back pain and gait problem.  Skin: Negative for rash.  Neurological: Negative for light-headedness and headaches.  Psychiatric/Behavioral: Positive for dysphoric mood. Negative for agitation, behavioral problems, self-injury, sleep disturbance and suicidal ideas. The patient is nervous/anxious.   All other systems reviewed and are negative.   Per HPI unless specifically indicated above   Allergies as of 11/16/2017      Reactions   Penicillins       Medication List        Accurate as of 11/16/17  4:15 PM. Always use your most recent med list.  ALPRAZolam 0.5 MG tablet Commonly known as:  XANAX Take 1 tablet (0.5 mg total) by mouth 2 (two) times daily as needed for anxiety.   escitalopram 10 MG tablet Commonly known as:  LEXAPRO Take 1 tablet (10 mg total) by mouth daily.   etonogestrel-ethinyl estradiol 0.12-0.015 MG/24HR vaginal ring Commonly known as:  Tensed 1 each vaginally every 28 (twenty-eight) days. Insert vaginally and leave in place for 3 consecutive weeks, then remove for 1 week.   loratadine 10 MG tablet Commonly known as:  CLARITIN Take 10 mg by mouth daily.   predniSONE 20 MG tablet Commonly known as:  DELTASONE 2 po at same time daily for 5 days          Objective:    BP 137/81   Pulse 96   Temp 98.2 F  (36.8 C) (Oral)   Ht 5\' 7"  (1.702 m)   Wt 222 lb (100.7 kg)   BMI 34.77 kg/m   Wt Readings from Last 3 Encounters:  11/16/17 222 lb (100.7 kg)  05/20/17 201 lb (91.2 kg)  02/17/17 196 lb 3.2 oz (89 kg)    Physical Exam  Constitutional: She is oriented to person, place, and time. She appears well-developed and well-nourished. No distress.  HENT:  Right Ear: Tympanic membrane, external ear and ear canal normal.  Left Ear: Tympanic membrane, external ear and ear canal normal.  Nose: Mucosal edema and rhinorrhea present. No epistaxis. Right sinus exhibits no maxillary sinus tenderness and no frontal sinus tenderness. Left sinus exhibits no maxillary sinus tenderness and no frontal sinus tenderness.  Mouth/Throat: Uvula is midline and mucous membranes are normal. Posterior oropharyngeal edema and posterior oropharyngeal erythema present. No oropharyngeal exudate or tonsillar abscesses.  Eyes: Conjunctivae and EOM are normal.  Neck: Neck supple. No thyromegaly present.  Cardiovascular: Normal rate, regular rhythm, normal heart sounds and intact distal pulses.  No murmur heard. Pulmonary/Chest: Effort normal and breath sounds normal. No respiratory distress. She has no wheezes. She has no rales.  Musculoskeletal: Normal range of motion. She exhibits no edema or tenderness.  Lymphadenopathy:    She has no cervical adenopathy.  Neurological: She is alert and oriented to person, place, and time. Coordination normal.  Skin: Skin is warm and dry. No rash noted. She is not diaphoretic.  Psychiatric: She has a normal mood and affect. Her behavior is normal.  Vitals reviewed.      Assessment & Plan:   Problem List Items Addressed This Visit      Respiratory   Acute non-recurrent ethmoidal sinusitis   Relevant Medications   predniSONE (DELTASONE) 20 MG tablet     Other   Depression, recurrent ([place]) - Primary   Relevant Medications   escitalopram (LEXAPRO) 10 MG tablet    Other Visit  Diagnoses    PTSD (post-traumatic stress disorder)       Relevant Medications   escitalopram (LEXAPRO) 10 MG tablet       Follow up plan: Return in about 6 months (around 05/16/2018), or if symptoms worsen or fail to improve, for Needs Pap and physical in April and a 31-month for depression recheck.  Counseling provided for all of the vaccine components No orders of the defined types were placed in this encounter.   Caryl Pina, MD [facility] 11/16/2017, 4:15 PM

## 2017-12-27 ENCOUNTER — Ambulatory Visit (INDEPENDENT_AMBULATORY_CARE_PROVIDER_SITE_OTHER): Payer: BLUE CROSS/BLUE SHIELD

## 2017-12-27 ENCOUNTER — Ambulatory Visit: Payer: BLUE CROSS/BLUE SHIELD | Admitting: Pediatrics

## 2017-12-27 ENCOUNTER — Encounter: Payer: Self-pay | Admitting: Pediatrics

## 2017-12-27 VITALS — BP 133/82 | HR 88 | Temp 97.8°F | Ht 67.0 in | Wt 218.0 lb

## 2017-12-27 DIAGNOSIS — M25562 Pain in left knee: Secondary | ICD-10-CM | POA: Diagnosis not present

## 2017-12-27 NOTE — Progress Notes (Signed)
  Subjective:   Patient ID: Kelly Campbell, female    DOB: 16-Nov-1990, 27 y.o.   MRN: [id number] CC: Knee Pain (Left, worsened over the last week)  HPI: Kelly Campbell is a 27 y.o. female presenting for Knee Pain (Left, worsened over the last week)  Started about a week ago.  She works driving a Forensic scientist.  She does not remember a specific injury to the knee.  For the last few days has been getting even worse.  When walking it hurts.  Weightbearing itself usually does not hurt.  Knee will frequently pop, sometimes lock up.  It is painful when it pops.  Has never had problems with this knee before.  Took naproxen twice a few days ago with some improvement.  Relevant past medical, surgical, family and social history reviewed. Allergies and medications reviewed and updated. Social History   Tobacco Use  Smoking Status Current Every Day Smoker  . Packs/day: 0.50  . Years: 5.00  . Pack years: 2.50  . Types: Cigarettes  Smokeless Tobacco Never Used   ROS: Per HPI   Objective:    BP 133/82   Pulse 88   Temp 97.8 F (36.6 C) (Oral)   Ht 5\' 7"  (1.702 m)   Wt 218 lb (98.9 kg)   BMI 34.14 kg/m   Wt Readings from Last 3 Encounters:  12/27/17 218 lb (98.9 kg)  11/16/17 222 lb (100.7 kg)  05/20/17 201 lb (91.2 kg)    Gen: NAD, alert, cooperative with exam, NCAT EYES: EOMI, no conjunctival injection, or no icterus ENT: OP without erythema LYMPH: no cervical LAD CV: NRRR, normal S1/S2, no murmur, Resp: CTABL, no wheezes, normal WOB Ext: No edema, warm Neuro: Alert and oriented MSK:  Left knee normal to inspection.  Some crepitus present left knee.  Decreased range of motion left knee compared to right, 180 degrees to apprx 90 degrees versus 180 to 45 on right.  ttp over L patellar tendon, no tenderness with patellar rocking, ttp posterior medial knee, medial joint line tenderness. Pop with some pain with Mcmurrays L knee.  Assessment & Plan:  Kelly Campbell was seen today for knee  pain.  Diagnoses and all orders for this visit:  Acute pain of left knee Rest, Ice, cont naproxen 500mg  BID for next week. -     DG Knee 1-2 Views Left; Future -     Ambulatory referral to Orthopedic Surgery   Follow up plan: Return if symptoms worsen or fail to improve. Assunta Found, MD [place]

## 2018-01-20 ENCOUNTER — Ambulatory Visit (INDEPENDENT_AMBULATORY_CARE_PROVIDER_SITE_OTHER): Payer: BLUE CROSS/BLUE SHIELD | Admitting: Orthopaedic Surgery

## 2018-01-31 ENCOUNTER — Telehealth: Payer: Self-pay | Admitting: Family Medicine

## 2018-01-31 ENCOUNTER — Other Ambulatory Visit: Payer: Self-pay | Admitting: Family Medicine

## 2018-01-31 DIAGNOSIS — Z3044 Encounter for surveillance of vaginal ring hormonal contraceptive device: Secondary | ICD-10-CM

## 2018-01-31 MED ORDER — ETONOGESTREL-ETHINYL ESTRADIOL 0.12-0.015 MG/24HR VA RING: 1.0000 | VAGINAL_RING | VAGINAL | 0 refills | Status: DC

## 2018-01-31 NOTE — Telephone Encounter (Signed)
Patient aware that rx sent to pharmacy. 

## 2018-02-15 DIAGNOSIS — W57XXXA Bitten or stung by nonvenomous insect and other nonvenomous arthropods, initial encounter: Secondary | ICD-10-CM | POA: Diagnosis not present

## 2018-02-15 DIAGNOSIS — Z6834 Body mass index (BMI) 34.0-34.9, adult: Secondary | ICD-10-CM | POA: Diagnosis not present

## 2018-02-15 DIAGNOSIS — R21 Rash and other nonspecific skin eruption: Secondary | ICD-10-CM | POA: Diagnosis not present

## 2018-02-18 ENCOUNTER — Ambulatory Visit (INDEPENDENT_AMBULATORY_CARE_PROVIDER_SITE_OTHER): Payer: BLUE CROSS/BLUE SHIELD | Admitting: Family Medicine

## 2018-02-18 ENCOUNTER — Encounter: Payer: Self-pay | Admitting: Family Medicine

## 2018-02-18 VITALS — BP 133/82 | HR 83 | Temp 98.0°F | Ht 67.0 in | Wt 216.0 lb

## 2018-02-18 DIAGNOSIS — Z0000 Encounter for general adult medical examination without abnormal findings: Secondary | ICD-10-CM | POA: Diagnosis not present

## 2018-02-18 DIAGNOSIS — F419 Anxiety disorder, unspecified: Secondary | ICD-10-CM | POA: Diagnosis not present

## 2018-02-18 DIAGNOSIS — Z23 Encounter for immunization: Secondary | ICD-10-CM

## 2018-02-18 DIAGNOSIS — Z13220 Encounter for screening for lipoid disorders: Secondary | ICD-10-CM

## 2018-02-18 DIAGNOSIS — F339 Major depressive disorder, recurrent, unspecified: Secondary | ICD-10-CM

## 2018-02-18 DIAGNOSIS — F329 Major depressive disorder, single episode, unspecified: Secondary | ICD-10-CM | POA: Diagnosis not present

## 2018-02-18 DIAGNOSIS — Z131 Encounter for screening for diabetes mellitus: Secondary | ICD-10-CM

## 2018-02-18 DIAGNOSIS — F4310 Post-traumatic stress disorder, unspecified: Secondary | ICD-10-CM | POA: Diagnosis not present

## 2018-02-18 DIAGNOSIS — Z3044 Encounter for surveillance of vaginal ring hormonal contraceptive device: Secondary | ICD-10-CM

## 2018-02-18 DIAGNOSIS — F32A Depression, unspecified: Secondary | ICD-10-CM

## 2018-02-18 MED ORDER — ALPRAZOLAM 0.5 MG PO TABS: 0.5000 mg | ORAL_TABLET | Freq: Two times a day (BID) | ORAL | 0 refills | Status: DC | PRN

## 2018-02-18 MED ORDER — ESCITALOPRAM OXALATE 20 MG PO TABS: 20.0000 mg | ORAL_TABLET | Freq: Every day | ORAL | 5 refills | Status: DC

## 2018-02-18 MED ORDER — ETONOGESTREL-ETHINYL ESTRADIOL 0.12-0.015 MG/24HR VA RING
1.0000 | VAGINAL_RING | VAGINAL | 0 refills | Status: DC
Start: 2018-02-18 — End: 2018-11-10

## 2018-02-18 NOTE — Progress Notes (Signed)
BP 133/82   Pulse 83   Temp 98 F (36.7 C) (Oral)   Ht _0  (1.702 m)   Wt 216 lb (98 kg)   BMI 33.83 kg/m    Subjective:    Patient ID: Kelly Campbell, female    DOB: 1991/04/28, 27 y.o.   MRN: [id number]  HPI: Kelly Campbell is a 27 y.o. female presenting on 02/18/2018 for Annual Exam (discuss increasing Lexapro)   HPI Well adult exam and physical and discuss anxiety depression and PTSD Patient is coming in for well adult exam and physical and labs and discuss anxiety depression PTSD.  She says she is been doing very well on her NuvaRing for birth control and she is very satisfied with it.  She has been having a little more of anxiety recently because her husband who is bipolar has stopped his medications which is putting a lot more stressors on her.  She says she is been starting to have a little bit of the panic attacks that she was having before would like some of the alprazolam to get her started initially and increase the Lexapro.  She denies any suicidal ideations or thoughts of hurting yourself but just says more is the anxiety that is building up.  She was trying to quit smoking but has increased again with all of the anxiety. Patient denies any chest pain, shortness of breath, headaches or vision issues, abdominal complaints, diarrhea, nausea, vomiting, or joint issues.  Depression screen [facility] 2/9 02/18/2018 12/27/2017 11/16/2017 05/20/2017 02/17/2017  Decreased Interest 1 0 1 0 0  Down, Depressed, Hopeless 1 0 0 0 1  PHQ - 2 Score 2 0 1 0 1  Altered sleeping 1 - - - -  Tired, decreased energy 1 - - - -  Change in appetite 0 - - - -  Feeling bad or failure about yourself  0 - - - -  Trouble concentrating 1 - - - -  Moving slowly or fidgety/restless 0 - - - -  Suicidal thoughts 0 - - - -  PHQ-9 Score 5 - - - -  Difficult doing work/chores - - - - -     Relevant past medical, surgical, family and social history reviewed and updated as indicated. Interim medical history since our  last visit reviewed. Allergies and medications reviewed and updated.  Review of Systems  Constitutional: Negative for chills and fever.  HENT: Negative for congestion, ear discharge, ear pain and tinnitus.   Eyes: Negative for pain, redness and visual disturbance.  Respiratory: Negative for cough, chest tightness, shortness of breath and wheezing.   Cardiovascular: Negative for chest pain, palpitations and leg swelling.  Gastrointestinal: Negative for abdominal pain, blood in stool, constipation and diarrhea.  Genitourinary: Negative for difficulty urinating, dysuria and hematuria.  Musculoskeletal: Negative for back pain, gait problem and myalgias.  Skin: Negative for rash.  Neurological: Negative for dizziness, weakness, light-headedness and headaches.  Psychiatric/Behavioral: Positive for dysphoric mood. Negative for agitation, behavioral problems, self-injury, sleep disturbance and suicidal ideas. The patient is nervous/anxious.   All other systems reviewed and are negative.   Per HPI unless specifically indicated above   Allergies as of 02/18/2018      Reactions   Penicillins       Medication List        Accurate as of 02/18/18  3:04 PM. Always use your most recent med list.          ALPRAZolam 0.5 MG tablet Commonly known  as:  XANAX Take 1 tablet (0.5 mg total) by mouth 2 (two) times daily as needed for anxiety.   doxycycline 100 MG tablet Commonly known as:  VIBRA-TABS Take 100 mg by mouth 2 (two) times daily.   escitalopram 20 MG tablet Commonly known as:  LEXAPRO Take 1 tablet (20 mg total) by mouth daily.   etonogestrel-ethinyl estradiol 0.12-0.015 MG/24HR vaginal ring Commonly known as:  Revloc 1 each vaginally every 28 (twenty-eight) days. Insert vaginally and leave in place for 3 consecutive weeks, then remove for 1 week.   loratadine 10 MG tablet Commonly known as:  CLARITIN Take 10 mg by mouth daily.          Objective:    BP 133/82    Pulse 83   Temp 98 F (36.7 C) (Oral)   Ht _0  (1.702 m)   Wt 216 lb (98 kg)   BMI 33.83 kg/m   Wt Readings from Last 3 Encounters:  02/18/18 216 lb (98 kg)  12/27/17 218 lb (98.9 kg)  11/16/17 222 lb (100.7 kg)    Physical Exam  Constitutional: She is oriented to person, place, and time. She appears well-developed and well-nourished. No distress.  HENT:  Nose: Nose normal.  Mouth/Throat: Oropharynx is clear and moist. No oropharyngeal exudate.  Eyes: Pupils are equal, round, and reactive to light. Conjunctivae and EOM are normal.  Neck: Normal range of motion. Neck supple. No thyromegaly present.  Cardiovascular: Normal rate, regular rhythm, normal heart sounds and intact distal pulses.  No murmur heard. Pulmonary/Chest: Effort normal and breath sounds normal. No respiratory distress. She has no wheezes.  Abdominal: Soft. Bowel sounds are normal. She exhibits no distension. There is no tenderness. There is no guarding.  Musculoskeletal: Normal range of motion. She exhibits no edema or tenderness.  Lymphadenopathy:    She has no cervical adenopathy.  Neurological: She is alert and oriented to person, place, and time. Coordination normal.  Skin: Skin is warm and dry. No rash noted. She is not diaphoretic.  Psychiatric: Her behavior is normal. Her mood appears anxious. She exhibits a depressed mood. She expresses no suicidal ideation. She expresses no suicidal plans.  Nursing note and vitals reviewed.       Assessment & Plan:   Problem List Items Addressed This Visit      Other   Depression, recurrent ([place])   Relevant Medications   escitalopram (LEXAPRO) 20 MG tablet   ALPRAZolam (XANAX) 0.5 MG tablet   Other Relevant Orders   CBC with Differential/Platelet    Other Visit Diagnoses    Well adult exam    -  Primary   Relevant Orders   CBC with Differential/Platelet   CMP14+EGFR   Lipid panel   Diabetes mellitus screening       Relevant Orders   CMP14+EGFR   Lipid  screening       Relevant Orders   Lipid panel   PTSD (post-traumatic stress disorder)       Relevant Medications   escitalopram (LEXAPRO) 20 MG tablet   ALPRAZolam (XANAX) 0.5 MG tablet   Other Relevant Orders   CBC with Differential/Platelet   Anxiety and depression       Relevant Medications   escitalopram (LEXAPRO) 20 MG tablet   ALPRAZolam (XANAX) 0.5 MG tablet   Other Relevant Orders   CBC with Differential/Platelet   Encounter for surveillance of vaginal ring hormonal contraceptive device       Relevant Medications   etonogestrel-ethinyl  estradiol (NUVARING) 0.12-0.015 MG/24HR vaginal ring      Patient says anxiety and depression has built up, will increase Lexapro to 20 mg give her a little bit of alprazolam for a month until she gets up to that dose.  She will send me a message in 1 month to see if it is going well, if not will come in, if doing well will come back in 6 months  Continue NuvaRing Check blood work today for yearly physical, due for Pap in 2 years   Follow up plan: Return in about 6 months (around 08/21/2018), or if symptoms worsen or fail to improve, for Anxiety depression recheck.  Counseling provided for all of the vaccine components Orders Placed This Encounter  Procedures  . Tdap vaccine greater than or equal to 7yo IM  . CBC with Differential/Platelet  . CMP14+EGFR  . Lipid panel    Caryl Pina, MD [facility] 02/18/2018, 3:04 PM

## 2018-02-19 LAB — CBC WITH DIFFERENTIAL/PLATELET
BASOS ABS: 0 10*3/uL (ref 0.0–0.2)
Basos: 0 %
EOS (ABSOLUTE): 0.1 10*3/uL (ref 0.0–0.4)
Eos: 1 %
Hematocrit: 40.7 % (ref 34.0–46.6)
Hemoglobin: 13.8 g/dL (ref 11.1–15.9)
Immature Grans (Abs): 0 10*3/uL (ref 0.0–0.1)
Immature Granulocytes: 0 %
LYMPHS ABS: 2.9 10*3/uL (ref 0.7–3.1)
Lymphs: 28 %
MCH: 30.7 pg (ref 26.6–33.0)
MCHC: 33.9 g/dL (ref 31.5–35.7)
MCV: 91 fL (ref 79–97)
Monocytes Absolute: 0.4 10*3/uL (ref 0.1–0.9)
Monocytes: 4 %
NEUTROS ABS: 6.8 10*3/uL (ref 1.4–7.0)
Neutrophils: 67 %
PLATELETS: 287 10*3/uL (ref 150–379)
RBC: 4.49 x10E6/uL (ref 3.77–5.28)
RDW: 12.8 % (ref 12.3–15.4)
WBC: 10.3 10*3/uL (ref 3.4–10.8)

## 2018-02-19 LAB — CMP14+EGFR
ALK PHOS: 80 IU/L (ref 39–117)
ALT: 10 IU/L (ref 0–32)
AST: 13 IU/L (ref 0–40)
Albumin/Globulin Ratio: 1.6 (ref 1.2–2.2)
Albumin: 4.3 g/dL (ref 3.5–5.5)
BUN/Creatinine Ratio: 13 (ref 9–23)
BUN: 8 mg/dL (ref 6–20)
CHLORIDE: 103 mmol/L (ref 96–106)
CO2: 20 mmol/L (ref 20–29)
Calcium: 9.5 mg/dL (ref 8.7–10.2)
Creatinine, Ser: 0.62 mg/dL (ref 0.57–1.00)
GFR calc Af Amer: 144 mL/min/{1.73_m2} (ref >59)
GFR calc non Af Amer: 125 mL/min/{1.73_m2} (ref >59)
GLUCOSE: 85 mg/dL (ref 65–99)
Globulin, Total: 2.7 g/dL (ref 1.5–4.5)
POTASSIUM: 4 mmol/L (ref 3.5–5.2)
Sodium: 139 mmol/L (ref 134–144)
Total Protein: 7 g/dL (ref 6.0–8.5)

## 2018-02-19 LAB — LIPID PANEL
CHOLESTEROL TOTAL: 177 mg/dL (ref 100–199)
Chol/HDL Ratio: 3.6 ratio (ref 0.0–4.4)
HDL: 49 mg/dL (ref >39)
LDL Calculated: 104 mg/dL — ABNORMAL HIGH (ref 0–99)
Triglycerides: 119 mg/dL (ref 0–149)
VLDL CHOLESTEROL CAL: 24 mg/dL (ref 5–40)

## 2018-03-23 ENCOUNTER — Ambulatory Visit: Payer: BLUE CROSS/BLUE SHIELD | Admitting: Family Medicine

## 2018-03-23 ENCOUNTER — Encounter: Payer: Self-pay | Admitting: Family Medicine

## 2018-03-23 VITALS — BP 134/82 | HR 89 | Temp 98.2°F | Ht 67.0 in | Wt 216.0 lb

## 2018-03-23 DIAGNOSIS — D485 Neoplasm of uncertain behavior of skin: Secondary | ICD-10-CM

## 2018-03-23 DIAGNOSIS — D225 Melanocytic nevi of trunk: Secondary | ICD-10-CM | POA: Diagnosis not present

## 2018-03-23 NOTE — Progress Notes (Signed)
BP 134/82   Pulse 89   Temp 98.2 F (36.8 C) (Oral)   Ht 5\' 7"  (1.702 m)   Wt 216 lb (98 kg)   BMI 33.83 kg/m    Subjective:    Patient ID: Kelly Campbell, female    DOB: March 18, 1991, 27 y.o.   MRN: [id number]  HPI: Kelly Campbell is a 27 y.o. female presenting on 03/23/2018 for Mole removal   HPI Mole removal Patient has a mole on her left upper back that is raised and pigmented and catches near her bra line and she would like it removed because she feels like is getting bigger and larger.  Relevant past medical, surgical, family and social history reviewed and updated as indicated. Interim medical history since our last visit reviewed. Allergies and medications reviewed and updated.  Review of Systems  Constitutional: Negative for chills and fever.  Eyes: Negative for visual disturbance.  Respiratory: Negative for chest tightness and shortness of breath.   Cardiovascular: Negative for chest pain and leg swelling.  Musculoskeletal: Negative for back pain and gait problem.  Skin: Negative for rash.  All other systems reviewed and are negative.   Per HPI unless specifically indicated above   Allergies as of 03/23/2018      Reactions   Penicillins       Medication List        Accurate as of 03/23/18  4:29 PM. Always use your most recent med list.          ALPRAZolam 0.5 MG tablet Commonly known as:  XANAX Take 1 tablet (0.5 mg total) by mouth 2 (two) times daily as needed for anxiety.   escitalopram 20 MG tablet Commonly known as:  LEXAPRO Take 1 tablet (20 mg total) by mouth daily.   etonogestrel-ethinyl estradiol 0.12-0.015 MG/24HR vaginal ring Commonly known as:  [place] 1 each vaginally every 28 (twenty-eight) days. Insert vaginally and leave in place for 3 consecutive weeks, then remove for 1 week.   loratadine 10 MG tablet Commonly known as:  CLARITIN Take 10 mg by mouth daily.          Objective:    BP 134/82   Pulse 89   Temp 98.2  F (36.8 C) (Oral)   Ht 5\' 7"  (1.702 m)   Wt 216 lb (98 kg)   BMI 33.83 kg/m   Wt Readings from Last 3 Encounters:  03/23/18 216 lb (98 kg)  02/18/18 216 lb (98 kg)  12/27/17 218 lb (98.9 kg)    Physical Exam  Constitutional: She is oriented to person, place, and time. She appears well-developed and well-nourished. No distress.  Eyes: Conjunctivae are normal.  Neurological: She is alert and oriented to person, place, and time. Coordination normal.  Skin: Skin is warm and dry. Lesion (Small less than 0.5 cm raised nevus near her bra line on back, has pigmented and center on top.) noted. No rash noted. She is not diaphoretic.  Psychiatric: She has a normal mood and affect. Her behavior is normal.  Nursing note and vitals reviewed.   Skin lesion removal: Topical Betadine was used for anesthesia 2% lidocaine with epinephrine was used for local anesthesia, 2 mL.  Shave biopsy was performed with good margins, sent for pathology.  Topical silver nitrate was used for hemostasis and then covered by 4 x 4 with tape. Procedure was tolerated well     Assessment & Plan:   Problem List Items Addressed This Visit    None  Visit Diagnoses    Atypical nevus of back    -  Primary   Relevant Orders   Pathology (Completed)       Follow up plan: Return if symptoms worsen or fail to improve.  Counseling provided for all of the vaccine components No orders of the defined types were placed in this encounter.   Caryl Pina, MD [facility] 03/23/2018, 4:29 PM

## 2018-03-25 LAB — PATHOLOGY

## 2018-11-10 ENCOUNTER — Ambulatory Visit: Payer: BLUE CROSS/BLUE SHIELD | Admitting: Physician Assistant

## 2018-11-10 ENCOUNTER — Encounter: Payer: Self-pay | Admitting: Physician Assistant

## 2018-11-10 ENCOUNTER — Telehealth: Payer: Self-pay | Admitting: Family Medicine

## 2018-11-10 VITALS — BP 146/89 | HR 110 | Temp 97.4°F | Ht 67.0 in | Wt 237.0 lb

## 2018-11-10 DIAGNOSIS — F329 Major depressive disorder, single episode, unspecified: Secondary | ICD-10-CM | POA: Diagnosis not present

## 2018-11-10 DIAGNOSIS — F419 Anxiety disorder, unspecified: Secondary | ICD-10-CM | POA: Diagnosis not present

## 2018-11-10 DIAGNOSIS — F4310 Post-traumatic stress disorder, unspecified: Secondary | ICD-10-CM

## 2018-11-10 DIAGNOSIS — F32A Depression, unspecified: Secondary | ICD-10-CM | POA: Insufficient documentation

## 2018-11-10 MED ORDER — ALPRAZOLAM 0.5 MG PO TABS: 0.5000 mg | ORAL_TABLET | Freq: Two times a day (BID) | ORAL | 0 refills | Status: DC | PRN

## 2018-11-10 MED ORDER — SERTRALINE HCL 50 MG PO TABS: 50.0000 mg | ORAL_TABLET | Freq: Every day | ORAL | 5 refills | Status: DC

## 2018-11-10 NOTE — Progress Notes (Signed)
BP (!) 146/89   Pulse (!) 110   Temp (!) 97.4 F (36.3 C) (Oral)   Ht 5\' 7"  (1.702 m)   Wt 237 lb (107.5 kg)   BMI 37.12 kg/m    Subjective:    Patient ID: Kelly Campbell, female    DOB: 08-11-1991, 28 y.o.   MRN: [id number]  HPI: Kelly Campbell is a 28 y.o. female presenting on 11/10/2018 for Anxiety  This patient comes in for periodic recheck on medications and conditions including depression, anxiety. Depression screen [facility] 2/9 11/10/2018 03/23/2018 02/18/2018 12/27/2017 11/16/2017  Decreased Interest 3 0 1 0 1  Down, Depressed, Hopeless 3 1 1  0 0  PHQ - 2 Score 6 1 2  0 1  Altered sleeping 2 - 1 - -  Tired, decreased energy 3 - 1 - -  Change in appetite 1 - 0 - -  Feeling bad or failure about yourself  2 - 0 - -  Trouble concentrating 3 - 1 - -  Moving slowly or fidgety/restless 1 - 0 - -  Suicidal thoughts 0 - 0 - -  PHQ-9 Score 18 - 5 - -  Difficult doing work/chores - - - - -   .   All medications are reviewed today. There are no reports of any problems with the medications. All of the medical conditions are reviewed and updated.  Lab work is reviewed and will be ordered as medically necessary. There are no new problems reported with today's visit.   Past Medical History:  Diagnosis Date  . Preeclampsia    Relevant past medical, surgical, family and social history reviewed and updated as indicated. Interim medical history since our last visit reviewed. Allergies and medications reviewed and updated. DATA REVIEWED: CHART IN EPIC  Family History reviewed for pertinent findings.  Review of Systems  Constitutional: Negative.   HENT: Negative.   Eyes: Negative.   Respiratory: Negative.   Gastrointestinal: Negative.   Genitourinary: Negative.   Psychiatric/Behavioral: Positive for agitation and decreased concentration. Negative for self-injury, sleep disturbance and suicidal ideas. The patient is nervous/anxious.     Allergies as of 11/10/2018      Reactions   Penicillins       Medication List       Accurate as of November 10, 2018  4:06 PM. Always use your most recent med list.        ALPRAZolam 0.5 MG tablet Commonly known as:  XANAX Take 1 tablet (0.5 mg total) by mouth 2 (two) times daily as needed for anxiety.   sertraline 50 MG tablet Commonly known as:  ZOLOFT Take 1 tablet (50 mg total) by mouth daily.          Objective:    BP (!) 146/89   Pulse (!) 110   Temp (!) 97.4 F (36.3 C) (Oral)   Ht 5\' 7"  (1.702 m)   Wt 237 lb (107.5 kg)   BMI 37.12 kg/m   Allergies  Allergen Reactions  . Penicillins     Wt Readings from Last 3 Encounters:  11/10/18 237 lb (107.5 kg)  03/23/18 216 lb (98 kg)  02/18/18 216 lb (98 kg)    Physical Exam Constitutional:      Appearance: She is well-developed.  HENT:     Head: Normocephalic and atraumatic.  Eyes:     Conjunctiva/sclera: Conjunctivae normal.     Pupils: Pupils are equal, round, and reactive to light.  Cardiovascular:     Rate and Rhythm: Normal  rate and regular rhythm.     Heart sounds: Normal heart sounds.  Pulmonary:     Effort: Pulmonary effort is normal.     Breath sounds: Normal breath sounds.  Abdominal:     General: Bowel sounds are normal.     Palpations: Abdomen is soft.  Skin:    General: Skin is warm and dry.     Findings: No rash.  Neurological:     Mental Status: She is alert and oriented to person, place, and time.     Deep Tendon Reflexes: Reflexes are normal and symmetric.  Psychiatric:        Behavior: Behavior normal.        Thought Content: Thought content normal.        Judgment: Judgment normal.         Assessment & Plan:   1. Anxiety and depression - sertraline (ZOLOFT) 50 MG tablet; Take 1 tablet (50 mg total) by mouth daily.  Dispense: 30 tablet; Refill: 5 - ALPRAZolam (XANAX) 0.5 MG tablet; Take 1 tablet (0.5 mg total) by mouth 2 (two) times daily as needed for anxiety.  Dispense: 30 tablet; Refill: 0  2. PTSD  (post-traumatic stress disorder) - ALPRAZolam (XANAX) 0.5 MG tablet; Take 1 tablet (0.5 mg total) by mouth 2 (two) times daily as needed for anxiety.  Dispense: 30 tablet; Refill: 0   Continue all other maintenance medications as listed above.  Follow up plan: Return in about 4 weeks (around 12/08/2018) for recheck PCP.  Educational handout given for [place] PA-C [facility] [address]  [place], [place] [address] [phone]   11/10/2018, 4:06 PM

## 2018-11-10 NOTE — Telephone Encounter (Signed)
Apt made to see Glenard Haring today.

## 2018-11-10 NOTE — Patient Instructions (Signed)
[facility] [phone]   Your provider wants you to schedule an appointment with a Psychologist/Psychiatrist. The following list of offices requires the patient to call and make their own appointment, as there is information they need that only you can provide. Please feel free to choose form the following providers:  [place] in [place]  [place]  [phone] [place], [place]  (Scheduled through [facility]) Must call and do an interview for appointment. Sees Children / Accepts Medicaid  [facility] [place]  [address] [place]    [place], [place]  [phone] [place], [place] for Autism but does not treat it Sees Children / Accepts Medicaid  [facility]    [phone] [address] [place], [place] Medication management, substance abuse, bipolar, grief, family, marriage, OCD, anxiety, PTSD Sees children / Accepts Medicaid  [facility]    [phone] [address], [place], [place] children / Accepts [facility]  [facility]  [phone] [address] [place], [place]   Dr Lorenza Evangelist     [phone] [address] [place], [place]  Sees ADD & ADHD for treatment Accepts Medicaid  [facility]  [phone] [address] Arlean Hopping, [place] for Autism Accepts [facility]  [facility]  [phone] [place], [place]  Does Adult ADD evaluations Does not accept Medicaid  [facility]   [phone] [place], [place] children as young as 92 years old Accepts [facility]     [phone]    [place], [place] [address] Sees children Accepts Medicaid

## 2018-12-08 ENCOUNTER — Encounter: Payer: Self-pay | Admitting: Family Medicine

## 2018-12-08 ENCOUNTER — Ambulatory Visit: Payer: BLUE CROSS/BLUE SHIELD | Admitting: Family Medicine

## 2018-12-08 VITALS — BP 129/80 | HR 98 | Temp 97.1°F | Ht 67.0 in | Wt 238.0 lb

## 2018-12-08 DIAGNOSIS — F419 Anxiety disorder, unspecified: Secondary | ICD-10-CM | POA: Diagnosis not present

## 2018-12-08 DIAGNOSIS — F4310 Post-traumatic stress disorder, unspecified: Secondary | ICD-10-CM | POA: Diagnosis not present

## 2018-12-08 DIAGNOSIS — F329 Major depressive disorder, single episode, unspecified: Secondary | ICD-10-CM

## 2018-12-08 DIAGNOSIS — F339 Major depressive disorder, recurrent, unspecified: Secondary | ICD-10-CM

## 2018-12-08 DIAGNOSIS — N926 Irregular menstruation, unspecified: Secondary | ICD-10-CM

## 2018-12-08 DIAGNOSIS — F32A Depression, unspecified: Secondary | ICD-10-CM

## 2018-12-08 MED ORDER — ETONOGESTREL-ETHINYL ESTRADIOL 0.12-0.015 MG/24HR VA RING: VAGINAL_RING | VAGINAL | 12 refills | Status: DC

## 2018-12-08 NOTE — Progress Notes (Signed)
BP 129/80   Pulse 98   Temp (!) 97.1 F (36.2 C) (Oral)   Ht 5\' 7"  (1.702 m)   Wt 238 lb (108 kg)   BMI 37.28 kg/m    Subjective:    Patient ID: Kelly Campbell, female    DOB: [birth year], 28 y.o.   MRN: [id number]  HPI: Kelly Campbell is a 28 y.o. female presenting on 12/08/2018 for Depression (4 week follow up) and Contraception   HPI PTSD anxiety depression Patient is coming in today to discuss PTSD and anxiety and depression.  She is currently been taking Zoloft with the occasional present lamp to help with breakthrough and says that it has been working for her and would like to continue it.  She says she is doing a lot better on this current regiment and is trying not to use the Xanax but infrequently and has not needed it too frequently Depression screen [facility] 2/9 12/08/2018 11/10/2018 03/23/2018 02/18/2018 12/27/2017  Decreased Interest 1 3 0 1 0  Down, Depressed, Hopeless 1 3 1 1  0  PHQ - 2 Score 2 6 1 2  0  Altered sleeping 1 2 - 1 -  Tired, decreased energy 1 3 - 1 -  Change in appetite 0 1 - 0 -  Feeling bad or failure about yourself  1 2 - 0 -  Trouble concentrating 0 3 - 1 -  Moving slowly or fidgety/restless 0 1 - 0 -  Suicidal thoughts 0 0 - 0 -  PHQ-9 Score 5 18 - 5 -  Difficult doing work/chores - - - - -    Missed menstrual cycle Comes in complaint of missed menstrual cycle for almost 2 months, she would like to go back on birth control but wants a urine pregnancy first.  Relevant past medical, surgical, family and social history reviewed and updated as indicated. Interim medical history since our last visit reviewed. Allergies and medications reviewed and updated.  Review of Systems  Constitutional: Negative for chills and fever.  Eyes: Negative for visual disturbance.  Respiratory: Negative for chest tightness and shortness of breath.   Cardiovascular: Negative for chest pain and leg swelling.  Musculoskeletal: Negative for back pain and gait problem.  Skin:  Negative for rash.  Neurological: Negative for light-headedness and headaches.  Psychiatric/Behavioral: Positive for dysphoric mood. Negative for agitation, behavioral problems, self-injury, sleep disturbance and suicidal ideas. The patient is nervous/anxious.   All other systems reviewed and are negative.  Per HPI unless specifically indicated above  Allergies as of 12/08/2018      Reactions   Penicillins       Medication List       Accurate as of December 08, 2018  4:43 PM. Always use your most recent med list.        ALPRAZolam 0.5 MG tablet Commonly known as:  XANAX Take 1 tablet (0.5 mg total) by mouth 2 (two) times daily as needed for anxiety.   sertraline 50 MG tablet Commonly known as:  ZOLOFT Take 1 tablet (50 mg total) by mouth daily.          Objective:    BP 129/80   Pulse 98   Temp (!) 97.1 F (36.2 C) (Oral)   Ht 5\' 7"  (1.702 m)   Wt 238 lb (108 kg)   BMI 37.28 kg/m   Wt Readings from Last 3 Encounters:  12/08/18 238 lb (108 kg)  11/10/18 237 lb (107.5 kg)  03/23/18 216 lb (98 kg)  Physical Exam Vitals signs and nursing note reviewed.  Constitutional:      General: She is not in acute distress.    Appearance: She is well-developed. She is not diaphoretic.  Eyes:     Conjunctiva/sclera: Conjunctivae normal.  Cardiovascular:     Rate and Rhythm: Normal rate and regular rhythm.     Heart sounds: Normal heart sounds. No murmur.  Pulmonary:     Effort: Pulmonary effort is normal. No respiratory distress.     Breath sounds: Normal breath sounds. No wheezing.  Musculoskeletal: Normal range of motion.        General: No tenderness.  Skin:    General: Skin is warm and dry.     Findings: No rash.  Neurological:     Mental Status: She is alert and oriented to person, place, and time.     Coordination: Coordination normal.  Psychiatric:        Mood and Affect: Mood is anxious and depressed.        Behavior: Behavior normal.        Thought  Content: Thought content does not include suicidal ideation. Thought content does not include suicidal plan.        Assessment & Plan:   Problem List Items Addressed This Visit      Other   Depression, recurrent ([place]) - Primary   Anxiety and depression   PTSD (post-traumatic stress disorder)    Other Visit Diagnoses    Missed menses       Relevant Medications   etonogestrel-ethinyl estradiol (NUVARING) 0.12-0.015 MG/24HR vaginal ring   Other Relevant Orders   Pregnancy, urine (Completed)      Continue Zoloft, and alprazolam as needed, will see back in 2 months Follow up plan: Return in about 8 weeks (around 02/02/2019), or if symptoms worsen or fail to improve, for Depression and menstrual cycles.  Counseling provided for all of the vaccine components No orders of the defined types were placed in this encounter.   Caryl Pina, MD [facility] 12/08/2018, 4:43 PM

## 2018-12-09 LAB — PREGNANCY, URINE: PREG TEST UR: NEGATIVE

## 2019-02-06 ENCOUNTER — Ambulatory Visit (INDEPENDENT_AMBULATORY_CARE_PROVIDER_SITE_OTHER): Payer: BLUE CROSS/BLUE SHIELD | Admitting: Family Medicine

## 2019-02-06 ENCOUNTER — Other Ambulatory Visit: Payer: Self-pay

## 2019-02-06 ENCOUNTER — Encounter: Payer: Self-pay | Admitting: Family Medicine

## 2019-02-06 DIAGNOSIS — B373 Candidiasis of vulva and vagina: Secondary | ICD-10-CM

## 2019-02-06 DIAGNOSIS — F339 Major depressive disorder, recurrent, unspecified: Secondary | ICD-10-CM | POA: Diagnosis not present

## 2019-02-06 DIAGNOSIS — F329 Major depressive disorder, single episode, unspecified: Secondary | ICD-10-CM

## 2019-02-06 DIAGNOSIS — B3731 Acute candidiasis of vulva and vagina: Secondary | ICD-10-CM

## 2019-02-06 DIAGNOSIS — F32A Depression, unspecified: Secondary | ICD-10-CM

## 2019-02-06 DIAGNOSIS — F419 Anxiety disorder, unspecified: Secondary | ICD-10-CM | POA: Diagnosis not present

## 2019-02-06 MED ORDER — SERTRALINE HCL 100 MG PO TABS: 100.0000 mg | ORAL_TABLET | Freq: Every day | ORAL | 1 refills | Status: DC

## 2019-02-06 MED ORDER — FLUCONAZOLE 150 MG PO TABS: 150.0000 mg | ORAL_TABLET | Freq: Once | ORAL | 0 refills | Status: AC

## 2019-02-06 NOTE — Progress Notes (Signed)
Virtual Visit via telephone Note  I connected with Kelly Campbell on 02/06/19 at 1600 by telephone and verified that I am speaking with the correct person using two identifiers. Kelly Campbell is currently located at car and no other people are currently with her during visit. The provider, Fransisca Kaufmann Oletha Tolson, MD is located in their office at time of visit.  Call ended at 1611  I discussed the limitations, risks, security and privacy concerns of performing an evaluation and management service by telephone and the availability of in person appointments. I also discussed with the patient that there may be a patient responsible charge related to this service. The patient expressed understanding and agreed to proceed.   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 Her work has been stressful and they had one co-worker get diagnosed with coronavirus.  She says it is just built up and she needed an increase in her medication to help with that  Patient has been having some vaginal discharge and irritation that is been going on over the past week.  She is tried some over-the-counter Azo yeast and it has not been helping much and she has not had time to go get Monistat but would like to do the fluconazole pill instead.  She denies any dysuria or abdominal pain or flank pain or fevers or chills.  No diagnosis found.  Outpatient Encounter Medications as of 02/06/2019  Medication Sig  . ALPRAZolam (XANAX) 0.5 MG tablet Take 1 tablet (0.5 mg total) by mouth 2 (two) times daily as needed for anxiety.  [name] etonogestrel-ethinyl estradiol (NUVARING) 0.12-0.015 MG/24HR vaginal ring Insert vaginally and leave in place for 3 consecutive weeks, then remove for 1 week.  . sertraline (ZOLOFT) 50 MG tablet Take 1 tablet (50 mg total) by mouth daily.   No facility-administered encounter medications  on file as of 02/06/2019.     Review of Systems  Constitutional: Negative for chills and fever.  Eyes: Negative for redness and visual disturbance.  Respiratory: Negative for chest tightness and shortness of breath.   Cardiovascular: Negative for chest pain and leg swelling.  Gastrointestinal: Negative for abdominal pain.  Genitourinary: Positive for vaginal discharge. Negative for difficulty urinating, dysuria, vaginal bleeding and vaginal pain.  Musculoskeletal: Negative for back pain and gait problem.  Skin: Negative for rash.  Neurological: Negative for light-headedness and headaches.  Psychiatric/Behavioral: Negative for agitation, behavioral problems, decreased concentration, dysphoric mood, self-injury, sleep disturbance and suicidal ideas. The patient is nervous/anxious.   All other systems reviewed and are negative.   Observations/Objective: Patient sounds comfortable and in no acute distress  Assessment and Plan: Problem List Items Addressed This Visit      Other   Depression, recurrent ([place]) - Primary   Relevant Medications   sertraline (ZOLOFT) 100 MG tablet   Anxiety and depression   Relevant Medications   sertraline (ZOLOFT) 100 MG tablet    Other Visit Diagnoses    Yeast vaginitis       Relevant Medications   fluconazole (DIFLUCAN) 150 MG tablet       Follow Up Instructions:  4 weeks anxiety and depression recheck.  She says her anxiety has been up so we increased her Zoloft to 100 mg and will recheck in 4 weeks  She says she is having some vaginal yeast infection symptoms and she wanted [place] to send a 1 pill for  that that helps.  She has been using some over-the-counter Azo without much success.   I discussed the assessment and treatment plan with the patient. The patient was provided an opportunity to ask questions and all were answered. The patient agreed with the plan and demonstrated an understanding of the instructions.   The patient was advised to call  back or seek an in-person evaluation if the symptoms worsen or if the condition fails to improve as anticipated.  The above assessment and management plan was discussed with the patient. The patient verbalized understanding of and has agreed to the management plan. Patient is aware to call the clinic if symptoms persist or worsen. Patient is aware when to return to the clinic for a follow-up visit. Patient educated on when it is appropriate to go to the emergency department.    I provided 11 minutes of non-face-to-face time during this encounter.    Worthy Rancher, MD

## 2019-06-01 ENCOUNTER — Other Ambulatory Visit: Payer: Self-pay | Admitting: Family Medicine

## 2019-06-01 NOTE — Telephone Encounter (Signed)
Patient aware she will need to be seen in office. Declines making an appt at this time

## 2019-06-01 NOTE — Telephone Encounter (Signed)
She has to be seen for a visit to get any kind of refills of controlled substances

## 2019-06-25 DIAGNOSIS — K047 Periapical abscess without sinus: Secondary | ICD-10-CM | POA: Diagnosis not present

## 2019-06-25 DIAGNOSIS — Z6835 Body mass index (BMI) 35.0-35.9, adult: Secondary | ICD-10-CM | POA: Diagnosis not present

## 2019-09-27 ENCOUNTER — Telehealth: Payer: Self-pay | Admitting: Family Medicine

## 2019-09-27 DIAGNOSIS — F419 Anxiety disorder, unspecified: Secondary | ICD-10-CM

## 2019-09-27 DIAGNOSIS — F32A Depression, unspecified: Secondary | ICD-10-CM

## 2019-09-27 DIAGNOSIS — F329 Major depressive disorder, single episode, unspecified: Secondary | ICD-10-CM

## 2019-09-27 MED ORDER — SERTRALINE HCL 100 MG PO TABS: 100.0000 mg | ORAL_TABLET | Freq: Every day | ORAL | 0 refills | Status: DC

## 2019-09-27 MED ORDER — SERTRALINE HCL 100 MG PO TABS: 100.0000 mg | ORAL_TABLET | Freq: Every day | ORAL | 2 refills | Status: DC

## 2019-09-27 NOTE — Addendum Note (Signed)
Addended by: Caryl Pina on: 09/27/2019 01:58 PM   Modules accepted: Orders

## 2019-09-27 NOTE — Telephone Encounter (Signed)
What is the name of the medication? sertraline (ZOLOFT) 100 MG tablet    Have you contacted your pharmacy to request a refill? yes  Which pharmacy would you like this sent to? [facility] [place], Pt has televisit appt on 10/25/2018 for med refill she will run out before then, she has 19 pills left   Patient notified that their request is being sent to the clinical staff for review and that they should receive a call once it is complete. If they do not receive a call within 24 hours they can check with their pharmacy or our office.

## 2019-09-27 NOTE — Telephone Encounter (Signed)
SENT IN

## 2019-10-26 ENCOUNTER — Ambulatory Visit (INDEPENDENT_AMBULATORY_CARE_PROVIDER_SITE_OTHER): Payer: BC Managed Care – PPO | Admitting: Family Medicine

## 2019-10-26 ENCOUNTER — Other Ambulatory Visit: Payer: Self-pay

## 2019-10-26 ENCOUNTER — Encounter: Payer: Self-pay | Admitting: Family Medicine

## 2019-10-26 DIAGNOSIS — F339 Major depressive disorder, recurrent, unspecified: Secondary | ICD-10-CM | POA: Diagnosis not present

## 2019-10-26 DIAGNOSIS — F329 Major depressive disorder, single episode, unspecified: Secondary | ICD-10-CM | POA: Diagnosis not present

## 2019-10-26 DIAGNOSIS — F32A Depression, unspecified: Secondary | ICD-10-CM

## 2019-10-26 DIAGNOSIS — N926 Irregular menstruation, unspecified: Secondary | ICD-10-CM

## 2019-10-26 DIAGNOSIS — F419 Anxiety disorder, unspecified: Secondary | ICD-10-CM

## 2019-10-26 MED ORDER — ETONOGESTREL-ETHINYL ESTRADIOL 0.12-0.015 MG/24HR VA RING: VAGINAL_RING | VAGINAL | 12 refills | Status: DC

## 2019-10-26 MED ORDER — SERTRALINE HCL 100 MG PO TABS: 150.0000 mg | ORAL_TABLET | Freq: Every day | ORAL | 3 refills | Status: DC

## 2019-10-26 NOTE — Progress Notes (Signed)
Virtual Visit via telephone Note  I connected with Kelly Campbell on 10/26/19 at 1312 by telephone and verified that I am speaking with the correct person using two identifiers. Kelly Campbell is currently located at home and no other people are currently with her during visit. The provider, Fransisca Kaufmann Lashanta Elbe, MD is located in their office at time of visit.  Call ended at 1321  I discussed the limitations, risks, security and privacy concerns of performing an evaluation and management service by telephone and the availability of in person appointments. I also discussed with the patient that there may be a patient responsible charge related to this service. The patient expressed understanding and agreed to proceed.   History and Present Illness: Depression and anxiety Patient is calling in for a recheck and there have been deaths in the family and she is feeling like she is not handling well.  She is not sleeping and she is worried and nervous.  She is getting a lot of panicking. This has been picking up. She is not sleeping   No diagnosis found.  Outpatient Encounter Medications as of 10/26/2019  Medication Sig  . ALPRAZolam (XANAX) 0.5 MG tablet Take 1 tablet (0.5 mg total) by mouth 2 (two) times daily as needed for anxiety.  [name] etonogestrel-ethinyl estradiol (NUVARING) 0.12-0.015 MG/24HR vaginal ring Insert vaginally and leave in place for 3 consecutive weeks, then remove for 1 week.  . sertraline (ZOLOFT) 100 MG tablet Take 1 tablet (100 mg total) by mouth daily.   No facility-administered encounter medications on file as of 10/26/2019.    Review of Systems  Constitutional: Negative for chills and fever.  Eyes: Negative for visual disturbance.  Respiratory: Negative for chest tightness and shortness of breath.   Cardiovascular: Negative for chest pain and leg swelling.  Musculoskeletal: Negative for back pain and gait problem.  Skin: Negative for rash.  Neurological: Negative for  light-headedness and headaches.  Psychiatric/Behavioral: Positive for decreased concentration, dysphoric mood and sleep disturbance. Negative for agitation, behavioral problems, self-injury and suicidal ideas. The patient is nervous/anxious.   All other systems reviewed and are negative.   Observations/Objective: Patient sounds comfortable  And in no acute distress  Assessment and Plan: Problem List Items Addressed This Visit      Other   Depression, recurrent ([place]) - Primary   Relevant Medications   sertraline (ZOLOFT) 100 MG tablet   Anxiety and depression   Relevant Medications   sertraline (ZOLOFT) 100 MG tablet    Other Visit Diagnoses    Missed menses       Relevant Medications   etonogestrel-ethinyl estradiol (NUVARING) 0.12-0.015 MG/24HR vaginal ring      Increased zoloft to 150 mg  Follow up plan: Return in about 3 months (around 01/24/2020), or if symptoms worsen or fail to improve, for depression and anxiety.     I discussed the assessment and treatment plan with the patient. The patient was provided an opportunity to ask questions and all were answered. The patient agreed with the plan and demonstrated an understanding of the instructions.   The patient was advised to call back or seek an in-person evaluation if the symptoms worsen or if the condition fails to improve as anticipated.  The above assessment and management plan was discussed with the patient. The patient verbalized understanding of and has agreed to the management plan. Patient is aware to call the clinic if symptoms persist or worsen. Patient is aware when to return to the clinic  for a follow-up visit. Patient educated on when it is appropriate to go to the emergency department.    I provided 9 minutes of non-face-to-face time during this encounter.    Worthy Rancher, MD

## 2019-12-08 DIAGNOSIS — Z6836 Body mass index (BMI) 36.0-36.9, adult: Secondary | ICD-10-CM | POA: Diagnosis not present

## 2019-12-08 DIAGNOSIS — J029 Acute pharyngitis, unspecified: Secondary | ICD-10-CM | POA: Diagnosis not present

## 2019-12-08 DIAGNOSIS — R519 Headache, unspecified: Secondary | ICD-10-CM | POA: Diagnosis not present

## 2019-12-12 ENCOUNTER — Encounter: Payer: Self-pay | Admitting: Nurse Practitioner

## 2019-12-12 ENCOUNTER — Ambulatory Visit (INDEPENDENT_AMBULATORY_CARE_PROVIDER_SITE_OTHER): Payer: BC Managed Care – PPO | Admitting: Nurse Practitioner

## 2019-12-12 DIAGNOSIS — J40 Bronchitis, not specified as acute or chronic: Secondary | ICD-10-CM

## 2019-12-12 MED ORDER — AZITHROMYCIN 250 MG PO TABS: ORAL_TABLET | ORAL | 0 refills | Status: DC

## 2019-12-12 NOTE — Progress Notes (Signed)
Virtual Visit via telephone Note Due to COVID-19 pandemic this visit was conducted virtually. This visit type was conducted due to national recommendations for restrictions regarding the COVID-19 Pandemic (e.g. social distancing, sheltering in place) in an effort to limit this patient's exposure and mitigate transmission in our community. All issues noted in this document were discussed and addressed.  A physical exam was not performed with this format.  I connected with Kelly Campbell on 12/12/19 at 11:10 by telephone and verified that I am speaking with the correct person using two identifiers. Kelly Campbell is currently located at home  and no one is currently with her during visit. The provider, Mary-Margaret Hassell Done, FNP is located in their office at time of visit.  I discussed the limitations, risks, security and privacy concerns of performing an evaluation and management service by telephone and the availability of in person appointments. I also discussed with the patient that there may be a patient responsible charge related to this service. The patient expressed understanding and agreed to proceed.   History and Present Illness:   Chief Complaint: Bronchitis   HPI Patient went to urgent care on Friday due to sore throat and cough. She was tested for covid and final test as well as rapid chest were negative. Now her cough is deeper and productive. Feels SOB and is very fatigued. Has low grade fever. Is not able to cough up anything even though she feels like she needs to.   Review of Systems  Constitutional: Negative for diaphoresis and weight loss.  HENT: Positive for congestion.   Eyes: Negative for blurred vision, double vision and pain.  Respiratory: Positive for cough. Negative for shortness of breath.   Cardiovascular: Negative for chest pain, palpitations, orthopnea and leg swelling.  Gastrointestinal: Negative for abdominal pain.  Skin: Negative for rash.  Neurological:  Negative for dizziness, sensory change, loss of consciousness, weakness and headaches.  Endo/Heme/Allergies: Negative for polydipsia. Does not bruise/bleed easily.  Psychiatric/Behavioral: Negative for memory loss. The patient does not have insomnia.   All other systems reviewed and are negative.    Observations/Objective: Alert and oriented- answers all questions appropriately No distress Dry tight sounding cough   Assessment and Plan: [name] in today with chief complaint of Bronchitis   1. Bronchitis 1. Take meds as prescribed 2. Use a cool mist humidifier especially during the winter months and when heat has been humid. 3. Use saline nose sprays frequently 4. Saline irrigations of the nose can be very helpful if done frequently.  * 4X daily for 1 week*  * Use of a nettie pot can be helpful with this. Follow directions with this* 5. Drink plenty of fluids 6. Keep thermostat turn down low 7.For any cough or congestion  Use plain Mucinex- regular strength or max strength is fine   * Children- consult with Pharmacist for dosing 8. For fever or aces or pains- take tylenol or ibuprofen appropriate for age and weight.  * for fevers greater than 101 orally you may alternate ibuprofen and tylenol every  3 hours.   Meds ordered this encounter  Medications  . azithromycin (ZITHROMAX Z-PAK) 250 MG tablet    Sig: As directed    Dispense:  6 tablet    Refill:  0    Bronchitis    Order Specific Question:   Supervising Provider    Answer:   Caryl Pina A [id number]        Follow Up Instructions: Prn  I discussed the assessment and treatment plan with the patient. The patient was provided an opportunity to ask questions and all were answered. The patient agreed with the plan and demonstrated an understanding of the instructions.   The patient was advised to call back or seek an in-person evaluation if the symptoms worsen or if the condition fails to improve as  anticipated.  The above assessment and management plan was discussed with the patient. The patient verbalized understanding of and has agreed to the management plan. Patient is aware to call the clinic if symptoms persist or worsen. Patient is aware when to return to the clinic for a follow-up visit. Patient educated on when it is appropriate to go to the emergency department.   Time call ended:  11:20 I provided 10 minutes of non-face-to-face time during this encounter.    Mary-Margaret Hassell Done, FNP

## 2019-12-26 ENCOUNTER — Telehealth: Payer: Self-pay | Admitting: Family Medicine

## 2019-12-26 NOTE — Telephone Encounter (Signed)
Aware of provider's advice. 

## 2019-12-26 NOTE — Telephone Encounter (Signed)
Kelly Campbell patient, please advise. Patient had first Covid vaccine this morning, Pfizer.  On her way home she began having a severe headache and nausea. She is wondering if she could already be having side effects from the vaccine and what she should do.

## 2019-12-26 NOTE — Telephone Encounter (Signed)
It is possible. Tylenol for, rest, force fluids. If symptoms worsen or do not improve she may need to be seen.

## 2020-01-02 ENCOUNTER — Telehealth: Payer: Self-pay | Admitting: Family Medicine

## 2020-01-03 NOTE — Telephone Encounter (Signed)
Patient aware and verbalized understanding. °

## 2020-01-03 NOTE — Telephone Encounter (Signed)
she can call us and we could do a virtual appointment, if she starts feeling bad and we can discuss that then.

## 2020-01-17 ENCOUNTER — Encounter: Payer: BC Managed Care – PPO | Admitting: Family Medicine

## 2020-02-14 ENCOUNTER — Ambulatory Visit (INDEPENDENT_AMBULATORY_CARE_PROVIDER_SITE_OTHER): Payer: BC Managed Care – PPO | Admitting: Family Medicine

## 2020-02-14 ENCOUNTER — Encounter: Payer: Self-pay | Admitting: Family Medicine

## 2020-02-14 ENCOUNTER — Other Ambulatory Visit: Payer: Self-pay

## 2020-02-14 VITALS — BP 134/84 | HR 89 | Temp 98.3°F | Ht 67.0 in | Wt 238.1 lb

## 2020-02-14 DIAGNOSIS — Z01419 Encounter for gynecological examination (general) (routine) without abnormal findings: Secondary | ICD-10-CM | POA: Diagnosis not present

## 2020-02-14 DIAGNOSIS — Z3044 Encounter for surveillance of vaginal ring hormonal contraceptive device: Secondary | ICD-10-CM

## 2020-02-14 DIAGNOSIS — F329 Major depressive disorder, single episode, unspecified: Secondary | ICD-10-CM | POA: Diagnosis not present

## 2020-02-14 DIAGNOSIS — F419 Anxiety disorder, unspecified: Secondary | ICD-10-CM

## 2020-02-14 DIAGNOSIS — F32A Depression, unspecified: Secondary | ICD-10-CM

## 2020-02-14 DIAGNOSIS — Z01411 Encounter for gynecological examination (general) (routine) with abnormal findings: Secondary | ICD-10-CM

## 2020-02-14 DIAGNOSIS — Z136 Encounter for screening for cardiovascular disorders: Secondary | ICD-10-CM | POA: Diagnosis not present

## 2020-02-14 NOTE — Progress Notes (Signed)
BP 134/84   Pulse 89   Temp 98.3 F (36.8 C) (Temporal)   Ht '5\' 7"'$  (1.702 m)   Wt 238 lb 2 oz (108 kg)   BMI 37.30 kg/m    Subjective:   Patient ID: Kelly Campbell, female    DOB: Apr 04, 1991, 29 y.o.   MRN: [id number]  HPI: Kelly Campbell is a 30 y.o. female presenting on 02/14/2020 for Annual Exam   HPI Well woman exam and physical Patient denies any chest pain, shortness of breath, headaches or vision issues, abdominal complaints, diarrhea, nausea, vomiting, or joint issues.  Patient denies any breast issues or lumps or bumps or discharge.  She denies any vaginal irregularities except for that her periods have lightened recently with the NuvaRing and have been doing well and she is actually very happy about that.  Relevant past medical, surgical, family and social history reviewed and updated as indicated. Interim medical history since our last visit reviewed. Allergies and medications reviewed and updated.  Review of Systems  Constitutional: Negative for chills and fever.  HENT: Negative for congestion, ear discharge and ear pain.   Eyes: Negative for redness and visual disturbance.  Respiratory: Negative for chest tightness and shortness of breath.   Cardiovascular: Negative for chest pain and leg swelling.  Gastrointestinal: Negative for abdominal distention, abdominal pain, constipation and diarrhea.  Genitourinary: Negative for difficulty urinating and dysuria.  Musculoskeletal: Negative for back pain and gait problem.  Skin: Negative for rash.  Neurological: Negative for light-headedness and headaches.  Psychiatric/Behavioral: Negative for agitation, behavioral problems, dysphoric mood, self-injury, sleep disturbance and suicidal ideas.  All other systems reviewed and are negative.   Per HPI unless specifically indicated above   Allergies as of 02/14/2020      Reactions   Penicillins       Medication List       Accurate as of Feb 14, 2020  3:26 PM. If you have  any questions, ask your nurse or doctor.        STOP taking these medications   azithromycin 250 MG tablet Commonly known as: Zithromax Z-Pak Stopped by: Fransisca Kaufmann Kateryna Grantham, MD     TAKE these medications   etonogestrel-ethinyl estradiol 0.12-0.015 MG/24HR vaginal ring Commonly known as: NuvaRing Insert vaginally and leave in place for 3 consecutive weeks, then remove for 1 week.   melatonin 5 MG Tabs Take 5 mg by mouth at bedtime as needed.   sertraline 100 MG tablet Commonly known as: ZOLOFT Take 1.5 tablets (150 mg total) by mouth daily.        Objective:   BP 134/84   Pulse 89   Temp 98.3 F (36.8 C) (Temporal)   Ht '5\' 7"'$  (1.702 m)   Wt 238 lb 2 oz (108 kg)   BMI 37.30 kg/m   Wt Readings from Last 3 Encounters:  02/14/20 238 lb 2 oz (108 kg)  12/08/18 238 lb (108 kg)  11/10/18 237 lb (107.5 kg)    Physical Exam Vitals and nursing note reviewed. Exam conducted with a chaperone present.  Constitutional:      General: She is not in acute distress.    Appearance: She is well-developed. She is not diaphoretic.  Eyes:     Conjunctiva/sclera: Conjunctivae normal.     Pupils: Pupils are equal, round, and reactive to light.  Neck:     Thyroid: No thyromegaly.  Cardiovascular:     Rate and Rhythm: Normal rate and regular rhythm.  Heart sounds: Normal heart sounds. No murmur.  Pulmonary:     Effort: Pulmonary effort is normal. No respiratory distress.     Breath sounds: Normal breath sounds. No wheezing.  Chest:     Breasts: Breasts are symmetrical.        Right: No inverted nipple, mass, nipple discharge, skin change or tenderness.        Left: No inverted nipple, mass, nipple discharge, skin change or tenderness.     Comments: Larger pendulous breasts but no nodules palpated. Abdominal:     General: Bowel sounds are normal. There is no distension.     Palpations: Abdomen is soft.     Tenderness: There is no abdominal tenderness. There is no guarding or  rebound.  Genitourinary:    Exam position: Supine.     Labia:        Right: No rash or lesion.        Left: No rash or lesion.      Vagina: Normal.     Cervix: No cervical motion tenderness, discharge or friability.     Uterus: Not deviated, not enlarged, not fixed and not tender.      Adnexa:        Right: No mass or tenderness.         Left: No mass or tenderness.       Comments: Enlarged transition zone Musculoskeletal:        General: No tenderness. Normal range of motion.     Cervical back: Neck supple.  Lymphadenopathy:     Cervical: No cervical adenopathy.  Skin:    General: Skin is warm and dry.     Findings: No rash.  Neurological:     Mental Status: She is alert and oriented to person, place, and time.     Coordination: Coordination normal.  Psychiatric:        Behavior: Behavior normal.       Assessment & Plan:   Problem List Items Addressed This Visit      Other   Anxiety and depression   Relevant Orders   CBC with Differential/Platelet   CMP14+EGFR   Lipid panel   TSH   Encounter for surveillance of vaginal ring hormonal contraceptive device   Relevant Orders   CBC with Differential/Platelet   CMP14+EGFR   Lipid panel   TSH    Other Visit Diagnoses    Well woman exam with routine gynecological exam    -  Primary   Relevant Orders   CBC with Differential/Platelet   CMP14+EGFR   Lipid panel   TSH      Continue current medication, no changes, feels like she is doing pretty well on her anxiety depression medicine and the vaginal ring. Follow up plan: Return in about 1 year (around 02/13/2021), or if symptoms worsen or fail to improve, for Adult well exam.  Counseling provided for all of the vaccine components Orders Placed This Encounter  Procedures  . CBC with Differential/Platelet  . CMP14+EGFR  . Lipid panel  . TSH    Caryl Pina, MD [facility] 02/14/2020, 3:26 PM

## 2020-02-14 NOTE — Addendum Note (Signed)
Addended by: Milas Hock on: 02/14/2020 04:24 PM   Modules accepted: Orders

## 2020-02-15 LAB — CBC WITH DIFFERENTIAL/PLATELET
Basophils Absolute: 0.1 10*3/uL (ref 0.0–0.2)
Basos: 0 %
EOS (ABSOLUTE): 0.1 10*3/uL (ref 0.0–0.4)
Eos: 1 %
Hematocrit: 41.8 % (ref 34.0–46.6)
Hemoglobin: 14.4 g/dL (ref 11.1–15.9)
Immature Grans (Abs): 0 10*3/uL (ref 0.0–0.1)
Immature Granulocytes: 0 %
Lymphocytes Absolute: 3 10*3/uL (ref 0.7–3.1)
Lymphs: 27 %
MCH: 29.8 pg (ref 26.6–33.0)
MCHC: 34.4 g/dL (ref 31.5–35.7)
MCV: 87 fL (ref 79–97)
Monocytes Absolute: 0.6 10*3/uL (ref 0.1–0.9)
Monocytes: 5 %
Neutrophils Absolute: 7.5 10*3/uL — ABNORMAL HIGH (ref 1.4–7.0)
Neutrophils: 67 %
Platelets: 329 10*3/uL (ref 150–450)
RBC: 4.83 x10E6/uL (ref 3.77–5.28)
RDW: 12.8 % (ref 11.7–15.4)
WBC: 11.3 10*3/uL — ABNORMAL HIGH (ref 3.4–10.8)

## 2020-02-15 LAB — LIPID PANEL
Chol/HDL Ratio: 4.3 ratio (ref 0.0–4.4)
Cholesterol, Total: 179 mg/dL (ref 100–199)
HDL: 42 mg/dL (ref >39)
LDL Chol Calc (NIH): 95 mg/dL (ref 0–99)
Triglycerides: 246 mg/dL — ABNORMAL HIGH (ref 0–149)
VLDL Cholesterol Cal: 42 mg/dL — ABNORMAL HIGH (ref 5–40)

## 2020-02-15 LAB — CMP14+EGFR
ALT: 10 IU/L (ref 0–32)
AST: 14 IU/L (ref 0–40)
Albumin/Globulin Ratio: 1.5 (ref 1.2–2.2)
Albumin: 4.3 g/dL (ref 3.9–5.0)
Alkaline Phosphatase: 91 IU/L (ref 39–117)
BUN/Creatinine Ratio: 16 (ref 9–23)
BUN: 10 mg/dL (ref 6–20)
Bilirubin Total: <0.2 mg/dL (ref 0.0–1.2)
CO2: 18 mmol/L — ABNORMAL LOW (ref 20–29)
Calcium: 10 mg/dL (ref 8.7–10.2)
Chloride: 104 mmol/L (ref 96–106)
Creatinine, Ser: 0.64 mg/dL (ref 0.57–1.00)
GFR calc Af Amer: 140 mL/min/{1.73_m2} (ref >59)
GFR calc non Af Amer: 122 mL/min/{1.73_m2} (ref >59)
Globulin, Total: 2.8 g/dL (ref 1.5–4.5)
Glucose: 80 mg/dL (ref 65–99)
Potassium: 4.1 mmol/L (ref 3.5–5.2)
Sodium: 137 mmol/L (ref 134–144)
Total Protein: 7.1 g/dL (ref 6.0–8.5)

## 2020-02-15 LAB — TSH: TSH: 1.23 u[IU]/mL (ref 0.450–4.500)

## 2020-02-17 LAB — IGP, CTNG, RFX APTIMA HPV ASCU
Chlamydia, Nuc. Acid Amp: NEGATIVE
Gonococcus by Nucleic Acid Amp: NEGATIVE

## 2020-02-22 ENCOUNTER — Telehealth: Payer: Self-pay | Admitting: Family Medicine

## 2020-02-22 NOTE — Telephone Encounter (Signed)
Pt returned missed call from [facility] regarding labs/PAP. Went over results with pt per Dr Neldon Mc notes. Pt voiced understanding.

## 2020-03-06 ENCOUNTER — Ambulatory Visit (INDEPENDENT_AMBULATORY_CARE_PROVIDER_SITE_OTHER): Payer: BC Managed Care – PPO

## 2020-03-06 ENCOUNTER — Encounter: Payer: Self-pay | Admitting: Family Medicine

## 2020-03-06 ENCOUNTER — Other Ambulatory Visit: Payer: Self-pay

## 2020-03-06 ENCOUNTER — Ambulatory Visit: Payer: BC Managed Care – PPO | Admitting: Family Medicine

## 2020-03-06 VITALS — BP 143/92 | HR 97 | Temp 98.1°F | Ht 67.0 in | Wt 240.0 lb

## 2020-03-06 DIAGNOSIS — M79671 Pain in right foot: Secondary | ICD-10-CM

## 2020-03-06 DIAGNOSIS — M7661 Achilles tendinitis, right leg: Secondary | ICD-10-CM | POA: Diagnosis not present

## 2020-03-06 MED ORDER — PREDNISONE 10 MG (21) PO TBPK: ORAL_TABLET | ORAL | 0 refills | Status: DC

## 2020-03-06 NOTE — Patient Instructions (Signed)
[employer]'s Emergency Medicine: Concepts and Clinical Practice (9th ed., pp. 1392-1401). [place], [place]: Elsevier[employer] Inc. Retrieved from https://www.clinicalkey.com/#!/content/book/3-[email or web address]-[email or web address]?scrollTo=%[email or web address]">  Achilles Tendinitis  Achilles tendinitis is inflammation of the tough, cord-like band that attaches the lower leg muscles to the heel bone (Achilles tendon). This is usually caused by overusing the tendon and the ankle joint. Achilles tendinitis usually gets better over time with treatment and caring for yourself at home. It can take weeks or months to heal completely. What are the causes? This condition may be caused by:  A sudden increase in exercise or activity, such as running.  Doing the same exercises or activities, such as jumping, over and over.  Not warming up calf muscles before exercising.  Exercising in shoes that are worn out or not made for exercise.  Having arthritis or a bone growth (spur) on the back of the heel bone. This can rub against the tendon and hurt it.  Age-related wear and tear. Tendons become less flexible with age and are more likely to be injured. What are the signs or symptoms? Common symptoms of this condition include:  Pain in the Achilles tendon or in the back of the leg, just above the heel. The pain usually gets worse with exercise.  Stiffness or soreness in the back of the leg, especially in the morning.  Swelling of the skin over the Achilles tendon.  Thickening of the tendon.  Trouble standing on tiptoe. How is this diagnosed? This condition is diagnosed based on your symptoms and a physical exam. You may have tests, including:  X-rays.  MRI. How is this treated? The goal of treatment is to relieve symptoms and help your injury heal. Treatment may include:  Decreasing or stopping activities that caused the tendinitis. This may mean switching to low-impact exercises like biking or swimming.  Icing the injured  area.  Doing physical therapy, including strengthening and stretching exercises.  Taking NSAIDs, such as ibuprofen, to help relieve pain and swelling.  Using supportive shoes, wraps, heel lifts, or a walking boot (air cast).  Having surgery. This may be done if your symptoms do not improve after other treatments.  Using high-energy shock wave impulses to stimulate the healing process (extracorporeal shock wave therapy). This is rare.  Having an injection of medicines that help relieve inflammation (corticosteroids). This is rare. Follow these instructions at home: If you have an air cast:  Wear the air cast as told by your health care provider. Remove it only as told by your health care provider.  Loosen it if your toes tingle, become numb, or turn cold and blue.  Keep it clean.  If the air cast is not waterproof: ? Do not let it get wet. ? Cover it with a watertight covering when you take a bath or shower. Managing pain, stiffness, and swelling   If directed, put ice on the injured area. To do this: ? If you have a removable air cast, remove it as told by your health care provider. ? Put ice in a plastic bag. ? Place a towel between your skin and the bag. ? Leave the ice on for 20 minutes, 2-3 times a day.  Move your toes often to reduce stiffness and swelling.  Raise (elevate) your foot above the level of your heart while you are sitting or lying down. Activity  Gradually return to your normal activities as told by your health care provider. Ask your health care provider what activities are safe for you.  Do not do   activities that cause pain.  Consider doing low-impact exercises, like cycling or swimming.  Ask your health care provider when it is safe to drive if you have an air cast on your foot.  If physical therapy was prescribed, do exercises as told by your health care provider or physical therapist. General instructions  If directed, wrap your foot with an  elastic bandage or other wrap. This can help to keep your tendon from moving too much while it heals. Your health care provider will show you how to wrap your foot correctly.  Wear supportive shoes or heel lifts only as told by your health care provider.  Take over-the-counter and prescription medicines only as told by your health care provider.  Keep all follow-up visits as told by your health care provider. This is important. Contact a health care provider if you:  Have symptoms that get worse.  Have pain that does not get better with medicine.  Develop new, unexplained symptoms.  Develop warmth and swelling in your foot.  Have a fever. Get help right away if you:  Have a sudden popping sound or sensation in your Achilles tendon followed by severe pain.  Cannot move your toes or foot.  Cannot put any weight on your foot.  Your foot or toes become numb and look white or blue even after loosening your bandage or air cast. Summary  Achilles tendinitis is inflammation of the tough, cord-like band that attaches the lower leg muscles to the heel bone (Achilles tendon).  This condition is usually caused by overusing the tendon and the ankle joint. It can also be caused by arthritis or normal aging.  The most common symptoms of this condition include pain, swelling, or stiffness in the Achilles tendon or in the back of the leg.  This condition is usually treated by decreasing or stopping activities that caused the tendinitis, icing the injured area, taking NSAIDs, and doing physical therapy. This information is not intended to replace advice given to you by your health care provider. Make sure you discuss any questions you have with your health care provider. Document Revised: 02/13/2019 Document Reviewed: 02/13/2019 Elsevier Patient Education  2020 Elsevier Inc.  

## 2020-03-06 NOTE — Progress Notes (Signed)
Assessment & Plan:  1. Achilles tendinitis of right lower extremity - Education provided on Achilles tendinitis.  Advised patient she may take either ibuprofen or naproxen but that she should not be taking both of them.  Patient does not want a note to keep her home from work tomorrow. - predniSONE (STERAPRED UNI-PAK 21 TAB) 10 MG (21) TBPK tablet; As directed x 6 days  Dispense: 21 tablet; Refill: 0  2. Pain of right heel - DG Os Calcis Right   Follow up plan: Return if symptoms worsen or fail to improve.  Hendricks Limes, MSN, APRN, FNP-C [facility]  Subjective:   Patient ID: Kelly Campbell, female    DOB: [birth year], 29 y.o.   MRN: [id number]  HPI: Kelly Campbell is a 29 y.o. female presenting on 03/06/2020 for Foot Pain (right, edema, stiffness and burning )  Patient reports right heel pain, swelling, stiffness, and burning that started 2 days ago.  Patient reports she is on her feet all day at work.  She does a lot of standing with small movements and occasional walking.  She has been taking either ibuprofen or naproxen at home as well as soaking her foot in Epsom salt.   ROS: Negative unless specifically indicated above in HPI.   Relevant past medical history reviewed and updated as indicated.   Allergies and medications reviewed and updated.   Current Outpatient Medications:  .  etonogestrel-ethinyl estradiol (NUVARING) 0.12-0.015 MG/24HR vaginal ring, Insert vaginally and leave in place for 3 consecutive weeks, then remove for 1 week., Disp: 1 each, Rfl: 12 .  melatonin 5 MG TABS, Take 5 mg by mouth at bedtime as needed., Disp: , Rfl:  .  sertraline (ZOLOFT) 100 MG tablet, Take 1.5 tablets (150 mg total) by mouth daily., Disp: 45 tablet, Rfl: 3 .  predniSONE (STERAPRED UNI-PAK 21 TAB) 10 MG (21) TBPK tablet, As directed x 6 days, Disp: 21 tablet, Rfl: 0  Allergies  Allergen Reactions  . Penicillins     Objective:   BP (!) 143/92   Pulse 97    Temp 98.1 F (36.7 C) (Temporal)   Ht 5\' 7"  (1.702 m)   Wt 240 lb (108.9 kg)   BMI 37.59 kg/m    Physical Exam Vitals reviewed.  Constitutional:      General: She is not in acute distress.    Appearance: Normal appearance. She is obese. She is not ill-appearing, toxic-appearing or diaphoretic.  HENT:     Head: Normocephalic and atraumatic.  Eyes:     General: No scleral icterus.       Right eye: No discharge.        Left eye: No discharge.     Conjunctiva/sclera: Conjunctivae normal.  Cardiovascular:     Rate and Rhythm: Normal rate.  Pulmonary:     Effort: Pulmonary effort is normal. No respiratory distress.  Musculoskeletal:        General: Normal range of motion.     Cervical back: Normal range of motion.     Right ankle: No swelling, deformity, ecchymosis or lacerations. No tenderness. Normal range of motion. Normal pulse.     Right Achilles Tendon: Tenderness present.  Skin:    General: Skin is warm and dry.     Capillary Refill: Capillary refill takes less than 2 seconds.  Neurological:     General: No focal deficit present.     Mental Status: She is alert and oriented to person, place, and time. Mental  status is at baseline.  Psychiatric:        Mood and Affect: Mood normal.        Behavior: Behavior normal.        Thought Content: Thought content normal.        Judgment: Judgment normal.

## 2020-03-25 ENCOUNTER — Other Ambulatory Visit: Payer: Self-pay | Admitting: Family Medicine

## 2020-03-25 DIAGNOSIS — F32A Depression, unspecified: Secondary | ICD-10-CM

## 2020-05-27 ENCOUNTER — Encounter (HOSPITAL_COMMUNITY): Payer: Self-pay | Admitting: Family Medicine

## 2020-05-27 ENCOUNTER — Ambulatory Visit (HOSPITAL_COMMUNITY)
Admission: EM | Admit: 2020-05-27 | Discharge: 2020-05-27 | Disposition: A | Discharge status: Home / Self Care | Payer: BC Managed Care – PPO | Attending: Family Medicine | Admitting: Family Medicine

## 2020-05-27 ENCOUNTER — Other Ambulatory Visit: Payer: Self-pay

## 2020-05-27 DIAGNOSIS — J209 Acute bronchitis, unspecified: Secondary | ICD-10-CM | POA: Diagnosis not present

## 2020-05-27 MED ORDER — ALBUTEROL SULFATE HFA 108 (90 BASE) MCG/ACT IN AERS: 1.0000 | INHALATION_SPRAY | Freq: Four times a day (QID) | RESPIRATORY_TRACT | 0 refills | Status: DC | PRN

## 2020-05-27 MED ORDER — PREDNISONE 10 MG PO TABS
40.0000 mg | ORAL_TABLET | Freq: Every day | ORAL | 0 refills | Status: AC
Start: 2020-05-27 — End: 2020-06-01

## 2020-05-27 MED ORDER — BENZONATATE 100 MG PO CAPS
100.0000 mg | ORAL_CAPSULE | Freq: Three times a day (TID) | ORAL | 0 refills | Status: DC
Start: 2020-05-27 — End: 2020-11-07

## 2020-05-27 MED ORDER — AZITHROMYCIN 250 MG PO TABS
250.0000 mg | ORAL_TABLET | Freq: Every day | ORAL | 0 refills | Status: DC
Start: 2020-05-27 — End: 2020-11-07

## 2020-05-27 NOTE — ED Triage Notes (Signed)
Pt present with chest congestion, headache, sinus pressure, chest pain after coughing xs 4 days.  Denies fever, n,v,d, loss of taste or smell, abdominal pain.  Pt states some relief with OTC medications.   Pt denies COVID test at this time.

## 2020-05-27 NOTE — Discharge Instructions (Addendum)
Treating you for bronchitis.  Take medication as prescribed. Follow up as needed for continued or worsening symptoms

## 2020-05-29 NOTE — ED Provider Notes (Signed)
[facility]    CSN: [id number] Arrival date & time: 05/27/20  1235      History   Chief Complaint Chief Complaint  Patient presents with  . Facial Pain    HPI Kelly Campbell is a 29 y.o. female.   Patient is a 29 year old female presents today with chest congestion, productive cough,  headache, sinus pressure, chest pain after coughing.  Denies any fever, nausea, vomiting, diarrhea, loss of taste or smell.  Having relief with over-the-counter medicines and feels that symptoms are improving.  Denies Covid testing at this time.  ROS per HPI      Past Medical History:  Diagnosis Date  . Preeclampsia     Patient Active Problem List   Diagnosis Date Noted  . Encounter for surveillance of vaginal ring hormonal contraceptive device 02/14/2020  . Anxiety and depression 11/10/2018  . PTSD (post-traumatic stress disorder) 11/10/2018  . Depression, recurrent ([place]) 01/15/2017    Past Surgical History:  Procedure Laterality Date  . CESAREAN SECTION      OB History   No obstetric history on file.      Home Medications    Prior to Admission medications   Medication Sig Start Date End Date Taking? Authorizing Provider  albuterol (VENTOLIN HFA) 108 (90 Base) MCG/ACT inhaler Inhale 1-2 puffs into the lungs every 6 (six) hours as needed for wheezing or shortness of breath. 05/27/20   Loura Halt A, NP  azithromycin (ZITHROMAX) 250 MG tablet Take 1 tablet (250 mg total) by mouth daily. Take first 2 tablets together, then 1 every day until finished. 05/27/20   Loura Halt A, NP  benzonatate (TESSALON) 100 MG capsule Take 1 capsule (100 mg total) by mouth every 8 (eight) hours. 05/27/20   Orvan July, NP  etonogestrel-ethinyl estradiol (NUVARING) 0.12-0.015 MG/24HR vaginal ring Insert vaginally and leave in place for 3 consecutive weeks, then remove for 1 week. 10/26/19   Dettinger, Fransisca Kaufmann, MD  melatonin 5 MG TABS Take 5 mg by mouth at bedtime as needed.    [provider]  predniSONE (DELTASONE) 10 MG tablet Take 4 tablets (40 mg total) by mouth daily for 5 days. 05/27/20 06/01/20  Loura Halt A, NP  sertraline (ZOLOFT) 100 MG tablet TAKE 1 & 1/2 (ONE & ONE-HALF) TABLETS BY MOUTH ONCE DAILY 03/25/20   Dettinger, Fransisca Kaufmann, MD    Family History Family History  Problem Relation Age of Onset  . Diabetes Mother   . Asthma Mother   . Asthma Sister     Social History Social History   Tobacco Use  . Smoking status: Current Every Day Smoker    Packs/day: 0.50    Years: 5.00    Pack years: 2.50    Types: Cigarettes  . Smokeless tobacco: Never Used  Substance Use Topics  . Alcohol use: No  . Drug use: No     Allergies   Penicillins   Review of Systems Review of Systems   Physical Exam Triage Vital Signs ED Triage Vitals  Enc Vitals Group     BP 05/27/20 1444 (!) 118/95     Pulse Rate 05/27/20 1444 91     Resp 05/27/20 1444 18     Temp 05/27/20 1444 98.1 F (36.7 C)     Temp Source 05/27/20 1444 Oral     SpO2 05/27/20 1444 100 %     Weight --      Height --      Head Circumference --  Peak Flow --      Pain Score 05/27/20 1442 3     Pain Loc --      Pain Edu? --      Excl. in [place]? --    No data found.  Updated Vital Signs BP (!) 118/95 (BP Location: Left Arm)   Pulse 91   Temp 98.1 F (36.7 C) (Oral)   Resp 18   LMP 05/23/2020   SpO2 100%   Visual Acuity Right Eye Distance:   Left Eye Distance:   Bilateral Distance:    Right Eye Near:   Left Eye Near:    Bilateral Near:     Physical Exam Vitals and nursing note reviewed.  Constitutional:      General: She is not in acute distress.    Appearance: Normal appearance. She is not ill-appearing, toxic-appearing or diaphoretic.  HENT:     Head: Normocephalic.     Right Ear: Tympanic membrane and ear canal normal.     Left Ear: Tympanic membrane and ear canal normal.     Nose: Nose normal.     Mouth/Throat:     Pharynx: Oropharynx is clear.  Eyes:      Conjunctiva/sclera: Conjunctivae normal.  Cardiovascular:     Rate and Rhythm: Normal rate and regular rhythm.  Pulmonary:     Effort: Pulmonary effort is normal.     Breath sounds: Wheezing and rhonchi present.  Musculoskeletal:        General: Normal range of motion.     Cervical back: Normal range of motion.  Skin:    General: Skin is warm and dry.     Findings: No rash.  Neurological:     Mental Status: She is alert.  Psychiatric:        Mood and Affect: Mood normal.      UC Treatments / Results  Labs (all labs ordered are listed, but only abnormal results are displayed) Labs Reviewed - No data to display  EKG   Radiology No results found.  Procedures Procedures (including critical care time)  Medications Ordered in UC Medications - No data to display  Initial Impression / Assessment and Plan / UC Course  I have reviewed the triage vital signs and the nursing notes.  Pertinent labs & imaging results that were available during my care of the patient were reviewed by me and considered in my medical decision making (see chart for details).     Acute bronchitis Treating with prednisone, tessalon pearls, Zithromax, and albuterol inhaler as needed.  Follow up as needed for continued or worsening symptoms  Final Clinical Impressions(s) / UC Diagnoses   Final diagnoses:  Acute bronchitis, unspecified organism     Discharge Instructions     Treating you for bronchitis.  Take medication as prescribed. Follow up as needed for continued or worsening symptoms     ED Prescriptions    Medication Sig Dispense Auth. Provider   predniSONE (DELTASONE) 10 MG tablet Take 4 tablets (40 mg total) by mouth daily for 5 days. 20 tablet Conor Filsaime A, NP   benzonatate (TESSALON) 100 MG capsule Take 1 capsule (100 mg total) by mouth every 8 (eight) hours. 21 capsule Iran Rowe A, NP   azithromycin (ZITHROMAX) 250 MG tablet Take 1 tablet (250 mg total) by mouth daily. Take  first 2 tablets together, then 1 every day until finished. 6 tablet Catrena Vari A, NP   albuterol (VENTOLIN HFA) 108 (90 Base) MCG/ACT inhaler Inhale 1-2  puffs into the lungs every 6 (six) hours as needed for wheezing or shortness of breath. 18 g Loura Halt A, NP     PDMP not reviewed this encounter.   Orvan July, NP 05/29/20 [phone]

## 2020-06-29 ENCOUNTER — Other Ambulatory Visit: Payer: Self-pay | Admitting: Family Medicine

## 2020-06-29 DIAGNOSIS — F419 Anxiety disorder, unspecified: Secondary | ICD-10-CM

## 2020-06-29 DIAGNOSIS — F32A Depression, unspecified: Secondary | ICD-10-CM

## 2020-07-01 NOTE — Telephone Encounter (Signed)
OV 02/14/20 RTC 1 yr

## 2020-07-09 ENCOUNTER — Telehealth: Payer: Self-pay | Admitting: Family Medicine

## 2020-07-09 NOTE — Telephone Encounter (Signed)
Pt had Tdap 02/18/2018. Informed that she will not need the injection for 10 years from that date.

## 2020-09-09 ENCOUNTER — Encounter: Payer: Self-pay | Admitting: Family Medicine

## 2020-09-09 ENCOUNTER — Ambulatory Visit (INDEPENDENT_AMBULATORY_CARE_PROVIDER_SITE_OTHER): Payer: BC Managed Care – PPO | Admitting: Family Medicine

## 2020-09-09 ENCOUNTER — Other Ambulatory Visit: Payer: Self-pay

## 2020-09-09 VITALS — BP 145/100 | HR 101 | Ht 67.0 in | Wt 242.0 lb

## 2020-09-09 DIAGNOSIS — R400 Somnolence: Secondary | ICD-10-CM

## 2020-09-09 DIAGNOSIS — R0683 Snoring: Secondary | ICD-10-CM | POA: Diagnosis not present

## 2020-09-09 DIAGNOSIS — G43009 Migraine without aura, not intractable, without status migrainosus: Secondary | ICD-10-CM

## 2020-09-09 MED ORDER — TOPIRAMATE 50 MG PO TABS: 50.0000 mg | ORAL_TABLET | Freq: Every evening | ORAL | 3 refills | Status: DC | PRN

## 2020-09-09 MED ORDER — RIZATRIPTAN BENZOATE 10 MG PO TABS: 10.0000 mg | ORAL_TABLET | ORAL | 2 refills | Status: DC | PRN

## 2020-09-09 NOTE — Progress Notes (Signed)
BP (!) 145/100   Pulse (!) 101   Ht 5\' 7"  (1.702 m)   Wt 242 lb (109.8 kg)   SpO2 97%   BMI 37.90 kg/m    Subjective:   Patient ID: Kelly Campbell, female    DOB: 02/03/1991, 29 y.o.   MRN: [id number]  HPI: Kelly Campbell is a 30 y.o. female presenting on 09/09/2020 for Migraine (getting worse. ) and Sleep Apnea (snoring and wakes up tired)   HPI Patient is coming in today because she has been having increasing migraines in both severity and frequency.  She has had migraines for quite a bit throughout her life and she has had ups and downs with them but now over the past 2 to 3 months it has been worsening.  She has been using Excedrin but it does not seem to be helping as much.  She has been having them at least 2-3 times a month but sometimes even more than that.  She says that it will last for 2 to 3 days when they come in she will have light sensitivity and has had some vomiting with them and she just has to go sleep to help get rid of them.  She says nothing else is helping currently.  She also complains of increased snoring and feeling tired through the day and she just feels like she does not have any energy and she does say that she gasps for air sometimes when she wakes up or her husband says she does gasp for air while she is sleeping sometimes.  Relevant past medical, surgical, family and social history reviewed and updated as indicated. Interim medical history since our last visit reviewed. Allergies and medications reviewed and updated.  Review of Systems  Constitutional: Negative for chills and fever.  Eyes: Negative for visual disturbance.  Respiratory: Negative for chest tightness and shortness of breath.   Cardiovascular: Negative for chest pain and leg swelling.  Gastrointestinal: Positive for nausea and vomiting.  Musculoskeletal: Negative for back pain and gait problem.  Skin: Negative for rash.  Neurological: Positive for dizziness and headaches. Negative for  light-headedness.  Psychiatric/Behavioral: Positive for sleep disturbance. Negative for agitation, behavioral problems and self-injury. The patient is not nervous/anxious.   All other systems reviewed and are negative.   Per HPI unless specifically indicated above   Allergies as of 09/09/2020      Reactions   Penicillins       Medication List       Accurate as of September 09, 2020  4:23 PM. If you have any questions, ask your nurse or doctor.        albuterol 108 (90 Base) MCG/ACT inhaler Commonly known as: VENTOLIN HFA Inhale 1-2 puffs into the lungs every 6 (six) hours as needed for wheezing or shortness of breath.   azithromycin 250 MG tablet Commonly known as: ZITHROMAX Take 1 tablet (250 mg total) by mouth daily. Take first 2 tablets together, then 1 every day until finished.   benzonatate 100 MG capsule Commonly known as: TESSALON Take 1 capsule (100 mg total) by mouth every 8 (eight) hours.   etonogestrel-ethinyl estradiol 0.12-0.015 MG/24HR vaginal ring Commonly known as: NuvaRing Insert vaginally and leave in place for 3 consecutive weeks, then remove for 1 week.   melatonin 5 MG Tabs Take 5 mg by mouth at bedtime as needed.   rizatriptan 10 MG tablet Commonly known as: Maxalt Take 1 tablet (10 mg total) by mouth as needed for migraine.  May repeat in 2 hours if needed Started by: Fransisca Kaufmann Eugena Rhue, MD   sertraline 100 MG tablet Commonly known as: ZOLOFT TAKE 1 & 1/2 (ONE & ONE-HALF) TABLETS BY MOUTH ONCE DAILY   topiramate 50 MG tablet Commonly known as: Topamax Take 1 tablet (50 mg total) by mouth at bedtime as needed. Started by: Fransisca Kaufmann Lynann Demetrius, MD        Objective:   BP (!) 145/100   Pulse (!) 101   Ht 5\' 7"  (1.702 m)   Wt 242 lb (109.8 kg)   SpO2 97%   BMI 37.90 kg/m   Wt Readings from Last 3 Encounters:  09/09/20 242 lb (109.8 kg)  03/06/20 240 lb (108.9 kg)  02/14/20 238 lb 2 oz (108 kg)    Physical Exam Vitals and nursing  note reviewed.  Constitutional:      General: She is not in acute distress.    Appearance: She is well-developed. She is not diaphoretic.  Eyes:     Extraocular Movements: Extraocular movements intact.     Conjunctiva/sclera: Conjunctivae normal.     Pupils: Pupils are equal, round, and reactive to light.  Cardiovascular:     Rate and Rhythm: Normal rate and regular rhythm.     Heart sounds: Normal heart sounds. No murmur heard.   Pulmonary:     Effort: Pulmonary effort is normal. No respiratory distress.     Breath sounds: Normal breath sounds. No wheezing.  Musculoskeletal:        General: No tenderness. Normal range of motion.  Skin:    General: Skin is warm and dry.     Findings: No rash.  Neurological:     Mental Status: She is alert and oriented to person, place, and time.     Cranial Nerves: No cranial nerve deficit.     Sensory: No sensory deficit.     Motor: No weakness.     Coordination: Coordination normal.     Gait: Gait normal.  Psychiatric:        Behavior: Behavior normal.       Assessment & Plan:   Problem List Items Addressed This Visit    None    Visit Diagnoses    Migraine without aura and without status migrainosus, not intractable    -  Primary   Relevant Medications   topiramate (TOPAMAX) 50 MG tablet   rizatriptan (MAXALT) 10 MG tablet   Other Relevant Orders   Ambulatory referral to Neurology   Somnolence, daytime       Relevant Orders   Ambulatory referral to Neurology   Snoring       Relevant Orders   Ambulatory referral to Neurology      Patient has increasing and worsening migraines, will try preventative and Topamax and Maxalt as an abortive and will also do referral for sleep apnea testing because that may be the trigger that is causing everything to be worse. Follow up plan: Return in about 3 months (around 12/09/2020), or if symptoms worsen or fail to improve, for Migraine recheck.  Counseling provided for all of the vaccine  components Orders Placed This Encounter  Procedures  . Ambulatory referral to Neurology    Caryl Pina, MD [facility] 09/09/2020, 4:23 PM

## 2020-09-16 ENCOUNTER — Other Ambulatory Visit: Payer: Self-pay | Admitting: Family Medicine

## 2020-09-16 DIAGNOSIS — F419 Anxiety disorder, unspecified: Secondary | ICD-10-CM

## 2020-09-16 DIAGNOSIS — F32A Depression, unspecified: Secondary | ICD-10-CM

## 2020-10-24 ENCOUNTER — Institutional Professional Consult (permissible substitution): Payer: BC Managed Care – PPO | Admitting: Neurology

## 2020-11-02 ENCOUNTER — Encounter: Payer: Self-pay | Admitting: Family Medicine

## 2020-11-07 ENCOUNTER — Encounter: Payer: Self-pay | Admitting: Family Medicine

## 2020-11-07 ENCOUNTER — Ambulatory Visit (INDEPENDENT_AMBULATORY_CARE_PROVIDER_SITE_OTHER): Payer: BC Managed Care – PPO | Admitting: Family Medicine

## 2020-11-07 DIAGNOSIS — M5442 Lumbago with sciatica, left side: Secondary | ICD-10-CM | POA: Diagnosis not present

## 2020-11-07 MED ORDER — PREDNISONE 10 MG (21) PO TBPK: ORAL_TABLET | ORAL | 0 refills | Status: DC

## 2020-11-07 MED ORDER — DICLOFENAC SODIUM 75 MG PO TBEC: 75.0000 mg | DELAYED_RELEASE_TABLET | Freq: Two times a day (BID) | ORAL | 1 refills | Status: DC

## 2020-11-07 NOTE — Progress Notes (Signed)
Virtual Visit via Telephone Note  I connected with Kelly Campbell on 11/07/20 at 2:45 PM by telephone and verified that I am speaking with the correct person using two identifiers. Kelly Campbell is currently located at home and her husband is currently with her during this visit. The provider, Loman Brooklyn, FNP is located in their home at time of visit.  I discussed the limitations, risks, security and privacy concerns of performing an evaluation and management service by telephone and the availability of in person appointments. I also discussed with the patient that there may be a patient responsible charge related to this service. The patient expressed understanding and agreed to proceed.  Subjective: PCP: Dettinger, Fransisca Kaufmann, MD  Chief Complaint  Patient presents with  . Back Pain   Back Pain: Patient c/o low back pain.  Symptoms have been present for 18 days since she contracted COVID-19 and are not improving. She states the pain was just in the middle of her lower back, then went to the top of her buttocks, and is now going down her left leg. Symptoms are worst: at work. Alleviating factors identifiable by patient are heating pad. Exacerbating factors identifiable by patient are standing. Treatments so far initiated by patient: Tylenol, NSAIDS, and heating pad Previous lower back problems: none. Previous workup: none. Previous treatments: none.    ROS: Per HPI  Current Outpatient Medications:  .  albuterol (VENTOLIN HFA) 108 (90 Base) MCG/ACT inhaler, Inhale 1-2 puffs into the lungs every 6 (six) hours as needed for wheezing or shortness of breath., Disp: 18 g, Rfl: 0 .  etonogestrel-ethinyl estradiol (NUVARING) 0.12-0.015 MG/24HR vaginal ring, Insert vaginally and leave in place for 3 consecutive weeks, then remove for 1 week., Disp: 1 each, Rfl: 12 .  melatonin 5 MG TABS, Take 5 mg by mouth at bedtime as needed., Disp: , Rfl:  .  rizatriptan (MAXALT) 10 MG tablet, Take 1 tablet  (10 mg total) by mouth as needed for migraine. May repeat in 2 hours if needed, Disp: 10 tablet, Rfl: 2 .  sertraline (ZOLOFT) 100 MG tablet, TAKE 1 & 1/2 (ONE & ONE-HALF) TABLETS BY MOUTH ONCE DAILY, Disp: 45 tablet, Rfl: 0 .  topiramate (TOPAMAX) 50 MG tablet, Take 1 tablet (50 mg total) by mouth at bedtime as needed., Disp: 90 tablet, Rfl: 3  Allergies  Allergen Reactions  . Penicillins    Past Medical History:  Diagnosis Date  . Preeclampsia     Observations/Objective: A&O  No respiratory distress or wheezing audible over the phone Mood, judgement, and thought processes all WNL   Assessment and Plan: 1. Acute bilateral low back pain with left-sided sciatica - Continue heating pad. Add Biofreeze.  - diclofenac (VOLTAREN) 75 MG EC tablet; Take 1 tablet (75 mg total) by mouth 2 (two) times daily.  Dispense: 30 tablet; Refill: 1 - predniSONE (STERAPRED UNI-PAK 21 TAB) 10 MG (21) TBPK tablet; As directed x 6 days  Dispense: 21 tablet; Refill: 0   Follow Up Instructions:  I discussed the assessment and treatment plan with the patient. The patient was provided an opportunity to ask questions and all were answered. The patient agreed with the plan and demonstrated an understanding of the instructions.   The patient was advised to call back or seek an in-person evaluation if the symptoms worsen or if the condition fails to improve as anticipated.  The above assessment and management plan was discussed with the patient. The patient verbalized understanding of and  has agreed to the management plan. Patient is aware to call the clinic if symptoms persist or worsen. Patient is aware when to return to the clinic for a follow-up visit. Patient educated on when it is appropriate to go to the emergency department.   Time call ended: 2:56 PM  I provided 13 minutes of non-face-to-face time during this encounter.  Hendricks Limes, MSN, APRN, FNP-C [facility] 11/07/20

## 2020-11-12 ENCOUNTER — Other Ambulatory Visit: Payer: Self-pay | Admitting: Family Medicine

## 2020-11-12 DIAGNOSIS — F419 Anxiety disorder, unspecified: Secondary | ICD-10-CM

## 2020-11-12 DIAGNOSIS — F32A Depression, unspecified: Secondary | ICD-10-CM

## 2020-11-12 MED ORDER — SERTRALINE HCL 100 MG PO TABS: 150.0000 mg | ORAL_TABLET | Freq: Every day | ORAL | 1 refills | Status: DC

## 2020-11-15 ENCOUNTER — Encounter: Payer: Self-pay | Admitting: Family Medicine

## 2020-11-24 ENCOUNTER — Other Ambulatory Visit: Payer: Self-pay | Admitting: Family Medicine

## 2020-11-24 DIAGNOSIS — N926 Irregular menstruation, unspecified: Secondary | ICD-10-CM

## 2020-12-11 ENCOUNTER — Other Ambulatory Visit: Payer: Self-pay

## 2020-12-11 ENCOUNTER — Ambulatory Visit: Payer: BC Managed Care – PPO | Admitting: Family Medicine

## 2020-12-11 ENCOUNTER — Encounter: Payer: Self-pay | Admitting: Family Medicine

## 2020-12-11 VITALS — BP 115/84 | HR 102 | Ht 67.0 in | Wt 236.2 lb

## 2020-12-11 DIAGNOSIS — F419 Anxiety disorder, unspecified: Secondary | ICD-10-CM

## 2020-12-11 DIAGNOSIS — F32A Depression, unspecified: Secondary | ICD-10-CM | POA: Diagnosis not present

## 2020-12-11 DIAGNOSIS — G43009 Migraine without aura, not intractable, without status migrainosus: Secondary | ICD-10-CM | POA: Diagnosis not present

## 2020-12-11 DIAGNOSIS — F339 Major depressive disorder, recurrent, unspecified: Secondary | ICD-10-CM

## 2020-12-11 MED ORDER — BUPROPION HCL ER (XL) 150 MG PO TB24: 150.0000 mg | ORAL_TABLET | Freq: Every day | ORAL | 1 refills | Status: DC

## 2020-12-11 NOTE — Progress Notes (Signed)
**Note De-Identified Kelly Obfuscation** BP 115/84   Pulse (!) 102   Ht 5\' 7"  (1.702 m)   Wt 236 lb 4 oz (107.2 kg)   SpO2 98%   BMI 37.00 kg/m    Subjective:   Patient ID: Kelly Campbell, female    DOB: 06-30-1991, 30 y.o.   MRN: [id number]  HPI: Kelly Campbell is a 30 y.o. female presenting on 12/11/2020 for No chief complaint on file.   HPI Patient is coming in for anxiety and depression.  She says she is been having on a little bit more anxiety recently especially social anxiety and she gets overwhelmed situations plus at work she has boss that she had had previously but they're not doing well with and is not very nice.  She would like to know if there is something else that can help with her anxiety.  She says it gets overwhelming at least 2-3 times a week when her boss is having a bad day.  Patient is coming in for recheck for migraines and she says they are improving and only 2-3 times a month and she is very happy with the Topamax and feels like it is helping.  She did get a little worse last month when she had COVID but feels like it is improving again.  Relevant past medical, surgical, family and social history reviewed and updated as indicated. Interim medical history since our last visit reviewed. Allergies and medications reviewed and updated.  Review of Systems  Constitutional: Negative for appetite change, chills and fever.  Eyes: Negative for visual disturbance.  Respiratory: Negative for chest tightness and shortness of breath.   Cardiovascular: Negative for chest pain and leg swelling.  Skin: Negative for rash.  Neurological: Positive for headaches. Negative for light-headedness.  Psychiatric/Behavioral: Positive for dysphoric mood. Negative for agitation, behavioral problems, self-injury, sleep disturbance and suicidal ideas. The patient is nervous/anxious.   All other systems reviewed and are negative.   Per HPI unless specifically indicated above   Allergies as of 12/11/2020      Reactions    Penicillins       Medication List       Accurate as of December 11, 2020  1:23 PM. If you have any questions, ask your nurse or doctor.        STOP taking these medications   diclofenac 75 MG EC tablet Commonly known as: VOLTAREN Stopped by: Fransisca Kaufmann Wetona Viramontes, MD   predniSONE 10 MG (21) Tbpk tablet Commonly known as: STERAPRED UNI-PAK 21 TAB Stopped by: Fransisca Kaufmann Pepper Wyndham, MD     TAKE these medications   albuterol 108 (90 Base) MCG/ACT inhaler Commonly known as: VENTOLIN HFA Inhale 1-2 puffs into the lungs every 6 (six) hours as needed for wheezing or shortness of breath.   EluRyng 0.12-0.015 MG/24HR vaginal ring Generic drug: etonogestrel-ethinyl estradiol INSERT 1 RING VAGINALLY AND LEAVE IN PLACE FOR 3 CONSECUTIVE WEEKS THEN REMOVE FOR 1 WEEK   melatonin 5 MG Tabs Take 5 mg by mouth at bedtime as needed.   rizatriptan 10 MG tablet Commonly known as: Maxalt Take 1 tablet (10 mg total) by mouth as needed for migraine. May repeat in 2 hours if needed   sertraline 100 MG tablet Commonly known as: ZOLOFT Take 1.5 tablets (150 mg total) by mouth daily.   topiramate 50 MG tablet Commonly known as: Topamax Take 1 tablet (50 mg total) by mouth at bedtime as needed.        Objective:   BP 115/84  Pulse (!) 102   Ht 5\' 7"  (1.702 m)   Wt 236 lb 4 oz (107.2 kg)   SpO2 98%   BMI 37.00 kg/m   Wt Readings from Last 3 Encounters:  12/11/20 236 lb 4 oz (107.2 kg)  09/09/20 242 lb (109.8 kg)  03/06/20 240 lb (108.9 kg)    Physical Exam Vitals and nursing note reviewed.  Constitutional:      General: She is not in acute distress.    Appearance: She is well-developed and well-nourished. She is not diaphoretic.  Eyes:     Extraocular Movements: EOM normal.     Conjunctiva/sclera: Conjunctivae normal.  Cardiovascular:     Pulses: Intact distal pulses.  Musculoskeletal:        General: No edema.  Skin:    General: Skin is warm and dry.     Findings: No rash.   Neurological:     Mental Status: She is alert and oriented to person, place, and time.     Coordination: Coordination normal.  Psychiatric:        Mood and Affect: Mood is anxious. Mood is not depressed.        Behavior: Behavior normal.        Thought Content: Thought content does not include suicidal ideation. Thought content does not include suicidal plan.       Assessment & Plan:   Problem List Items Addressed This Visit      Other   Depression, recurrent ([place])   Relevant Medications   buPROPion (WELLBUTRIN XL) 150 MG 24 hr tablet   Anxiety and depression - Primary   Relevant Medications   buPROPion (WELLBUTRIN XL) 150 MG 24 hr tablet    Other Visit Diagnoses    Migraine without aura and without status migrainosus, not intractable       Relevant Medications   buPROPion (WELLBUTRIN XL) 150 MG 24 hr tablet      Will start Wellbutrin and see if it helps, continue the Zoloft and continue Topamax for migraines. Follow up plan: Return if symptoms worsen or fail to improve, for 3 to 4 weeks anxiety depression recheck.  Counseling provided for all of the vaccine components No orders of the defined types were placed in this encounter.   Caryl Pina, MD [facility] 12/11/2020, 1:23 PM

## 2020-12-31 ENCOUNTER — Encounter: Payer: Self-pay | Admitting: Family Medicine

## 2020-12-31 DIAGNOSIS — N926 Irregular menstruation, unspecified: Secondary | ICD-10-CM

## 2021-01-01 MED ORDER — ETONOGESTREL-ETHINYL ESTRADIOL 0.12-0.015 MG/24HR VA RING: VAGINAL_RING | VAGINAL | 0 refills | Status: DC

## 2021-01-09 ENCOUNTER — Other Ambulatory Visit: Payer: Self-pay

## 2021-01-09 ENCOUNTER — Encounter: Payer: Self-pay | Admitting: Family Medicine

## 2021-01-09 ENCOUNTER — Ambulatory Visit: Payer: BC Managed Care – PPO | Admitting: Family Medicine

## 2021-01-09 DIAGNOSIS — F339 Major depressive disorder, recurrent, unspecified: Secondary | ICD-10-CM | POA: Diagnosis not present

## 2021-01-09 DIAGNOSIS — F32A Depression, unspecified: Secondary | ICD-10-CM

## 2021-01-09 DIAGNOSIS — N926 Irregular menstruation, unspecified: Secondary | ICD-10-CM | POA: Diagnosis not present

## 2021-01-09 DIAGNOSIS — F419 Anxiety disorder, unspecified: Secondary | ICD-10-CM

## 2021-01-09 MED ORDER — ETONOGESTREL-ETHINYL ESTRADIOL 0.12-0.015 MG/24HR VA RING
VAGINAL_RING | VAGINAL | 1 refills | Status: DC
Start: 2021-01-09 — End: 2021-02-04

## 2021-01-09 MED ORDER — BUPROPION HCL ER (XL) 150 MG PO TB24: 150.0000 mg | ORAL_TABLET | Freq: Every day | ORAL | 3 refills | Status: DC

## 2021-01-09 MED ORDER — SERTRALINE HCL 100 MG PO TABS: 150.0000 mg | ORAL_TABLET | Freq: Every day | ORAL | 3 refills | Status: DC

## 2021-01-09 NOTE — Progress Notes (Signed)
Virtual Visit via telephone Note  I connected with Kelly Campbell on 01/09/21 at 1640 by telephone and verified that I am speaking with the correct person using two identifiers. Kelly Campbell is currently located at home and patient are currently with her during visit. The provider, Fransisca Kaufmann Shaelin Lalley, MD is located in their office at time of visit.  Call ended at 1652  I discussed the limitations, risks, security and privacy concerns of performing an evaluation and management service by telephone and the availability of in person appointments. I also discussed with the patient that there may be a patient responsible charge related to this service. The patient expressed understanding and agreed to proceed.   History and Present Illness: Patient is calling in for anxiety and depression and is on Wellbutrin and Zoloft and likes Wellbutrin and feels like she is doing well with anxiety.  She is doing better with migraines and stress. She has some sinus issues but is having allergies.  She had some anxiety at work and was able to calm her self down and not let it build up. She is using grounding techniques and is doing better.   She is taking mucinex dm for sinuses and is getting some relief and is doing better.   No diagnosis found.  Outpatient Encounter Medications as of 01/09/2021  Medication Sig  . albuterol (VENTOLIN HFA) 108 (90 Base) MCG/ACT inhaler Inhale 1-2 puffs into the lungs every 6 (six) hours as needed for wheezing or shortness of breath.  Kelly Campbell buPROPion (WELLBUTRIN XL) 150 MG 24 hr tablet Take 1 tablet (150 mg total) by mouth daily.  Kelly Campbell etonogestrel-ethinyl estradiol (ELURYNG) 0.12-0.015 MG/24HR vaginal ring INSERT 1 RING VAGINALLY AND LEAVE IN PLACE FOR 3 CONSECUTIVE WEEKS THEN REMOVE FOR 1 WEEK  . melatonin 5 MG TABS Take 5 mg by mouth at bedtime as needed.  . rizatriptan (MAXALT) 10 MG tablet Take 1 tablet (10 mg total) by mouth as needed for migraine. May repeat in 2 hours if  needed  . sertraline (ZOLOFT) 100 MG tablet Take 1.5 tablets (150 mg total) by mouth daily.  Kelly Campbell topiramate (TOPAMAX) 50 MG tablet Take 1 tablet (50 mg total) by mouth at bedtime as needed.   No facility-administered encounter medications on file as of 01/09/2021.    Review of Systems  Constitutional: Negative for chills and fever.  HENT: Positive for sinus pressure. Negative for congestion, ear discharge, ear pain, postnasal drip, rhinorrhea, sneezing and sore throat.   Eyes: Negative for pain, redness and visual disturbance.  Respiratory: Negative for chest tightness and shortness of breath.   Cardiovascular: Negative for chest pain and leg swelling.  Genitourinary: Negative for difficulty urinating and dysuria.  Musculoskeletal: Negative for back pain and gait problem.  Skin: Negative for rash.  Neurological: Negative for light-headedness and headaches.  Psychiatric/Behavioral: Negative for agitation and behavioral problems.  All other systems reviewed and are negative.   Observations/Objective: Patient sounds comfortable and in no acute distress  Assessment and Plan: Problem List Items Addressed This Visit      Other   Depression, recurrent ([place])   Relevant Medications   buPROPion (WELLBUTRIN XL) 150 MG 24 hr tablet   sertraline (ZOLOFT) 100 MG tablet   Anxiety and depression - Primary   Relevant Medications   buPROPion (WELLBUTRIN XL) 150 MG 24 hr tablet   sertraline (ZOLOFT) 100 MG tablet    Other Visit Diagnoses    Missed menses       Relevant Medications  etonogestrel-ethinyl estradiol (ELURYNG) 0.12-0.015 MG/24HR vaginal ring    Sounds like she is doing well with anxiety depression, will continue for with current medication.  Follow up plan: Return if symptoms worsen or fail to improve, for 2-3 months anxiety.     I discussed the assessment and treatment plan with the patient. The patient was provided an opportunity to ask questions and all were answered. The  patient agreed with the plan and demonstrated an understanding of the instructions.   The patient was advised to call back or seek an in-person evaluation if the symptoms worsen or if the condition fails to improve as anticipated.  The above assessment and management plan was discussed with the patient. The patient verbalized understanding of and has agreed to the management plan. Patient is aware to call the clinic if symptoms persist or worsen. Patient is aware when to return to the clinic for a follow-up visit. Patient educated on when it is appropriate to go to the emergency department.    I provided 12 minutes of non-face-to-face time during this encounter.    Worthy Rancher, MD

## 2021-01-16 ENCOUNTER — Other Ambulatory Visit: Payer: Self-pay

## 2021-01-16 ENCOUNTER — Ambulatory Visit: Payer: BC Managed Care – PPO | Admitting: Family Medicine

## 2021-01-16 ENCOUNTER — Encounter: Payer: Self-pay | Admitting: Family Medicine

## 2021-01-16 VITALS — BP 123/75 | HR 88 | Temp 97.9°F

## 2021-01-16 DIAGNOSIS — J069 Acute upper respiratory infection, unspecified: Secondary | ICD-10-CM | POA: Diagnosis not present

## 2021-01-16 DIAGNOSIS — R0602 Shortness of breath: Secondary | ICD-10-CM

## 2021-01-16 DIAGNOSIS — R52 Pain, unspecified: Secondary | ICD-10-CM | POA: Diagnosis not present

## 2021-01-16 LAB — VERITOR FLU A/B WAIVED
Influenza A: NEGATIVE
Influenza B: NEGATIVE

## 2021-01-16 LAB — CULTURE, GROUP A STREP

## 2021-01-16 LAB — RAPID STREP SCREEN (MED CTR MEBANE ONLY): Strep Gp A Ag, IA W/Reflex: NEGATIVE

## 2021-01-16 MED ORDER — DOXYCYCLINE HYCLATE 100 MG PO TABS: 100.0000 mg | ORAL_TABLET | Freq: Two times a day (BID) | ORAL | 0 refills | Status: AC

## 2021-01-16 MED ORDER — ALBUTEROL SULFATE HFA 108 (90 BASE) MCG/ACT IN AERS: 1.0000 | INHALATION_SPRAY | Freq: Four times a day (QID) | RESPIRATORY_TRACT | 0 refills | Status: DC | PRN

## 2021-01-16 MED ORDER — FLUTICASONE PROPIONATE 50 MCG/ACT NA SUSP: 2.0000 | Freq: Every day | NASAL | 6 refills | Status: AC

## 2021-01-16 NOTE — Progress Notes (Signed)
Acute Office Visit  Subjective:    Patient ID: Kelly Campbell, female    DOB: 1991/08/26, 30 y.o.   MRN: [id number]  Chief Complaint  Patient presents with  . Shortness of Breath    Cough, congestion, body aches, fatigue    HPI Patient is in today for cough, congestion, body aches, and fatigue x 3 weeks. She also reports watery eyes and sneezing. She also has been having some shortness of breath where she feeling like she is having to breathe heavier than usual while at work. She has been using humidifier with vapor pods. She has been taking mucinex and cough syrup with a little improvement. Denies fever, chest pain, nausea, vomiting, or diarrhea. She has been out of her albuterol inhaler. She takes claritin or zyrtec daily for allergies. She has felt better and then worse a few times over the last few weeks. She has had two negative Covid tests about 1 week apart.   Past Medical History:  Diagnosis Date  . Preeclampsia     Past Surgical History:  Procedure Laterality Date  . CESAREAN SECTION      Family History  Problem Relation Age of Onset  . Diabetes Mother   . Asthma Mother   . Asthma Sister     Social History   Socioeconomic History  . Marital status: Single    Spouse name: Not on file  . Number of children: Not on file  . Years of education: Not on file  . Highest education level: Not on file  Occupational History  . Not on file  Tobacco Use  . Smoking status: Current Every Day Smoker    Packs/day: 0.50    Years: 5.00    Pack years: 2.50    Types: Cigarettes  . Smokeless tobacco: Never Used  Substance and Sexual Activity  . Alcohol use: No  . Drug use: No  . Sexual activity: Yes    Birth control/protection: Inserts  Other Topics Concern  . Not on file  Social History Narrative  . Not on file   Social Determinants of Health   Financial Resource Strain: Not on file  Food Insecurity: Not on file  Transportation Needs: Not on file  Physical  Activity: Not on file  Stress: Not on file  Social Connections: Not on file  Intimate Partner Violence: Not on file    Outpatient Medications Prior to Visit  Medication Sig Dispense Refill  . buPROPion (WELLBUTRIN XL) 150 MG 24 hr tablet Take 1 tablet (150 mg total) by mouth daily. 30 tablet 3  . etonogestrel-ethinyl estradiol (ELURYNG) 0.12-0.015 MG/24HR vaginal ring INSERT 1 RING VAGINALLY AND LEAVE IN PLACE FOR 3 CONSECUTIVE WEEKS THEN REMOVE FOR 1 WEEK 1 each 1  . melatonin 5 MG TABS Take 5 mg by mouth at bedtime as needed.    . rizatriptan (MAXALT) 10 MG tablet Take 1 tablet (10 mg total) by mouth as needed for migraine. May repeat in 2 hours if needed 10 tablet 2  . sertraline (ZOLOFT) 100 MG tablet Take 1.5 tablets (150 mg total) by mouth daily. 45 tablet 3  . topiramate (TOPAMAX) 50 MG tablet Take 1 tablet (50 mg total) by mouth at bedtime as needed. 90 tablet 3  . albuterol (VENTOLIN HFA) 108 (90 Base) MCG/ACT inhaler Inhale 1-2 puffs into the lungs every 6 (six) hours as needed for wheezing or shortness of breath. (Patient not taking: Reported on 01/16/2021) 18 g 0   No facility-administered medications prior to  visit.    Allergies  Allergen Reactions  . Penicillins     Review of Systems As per HPI.     Objective:    Physical Exam Vitals and nursing note reviewed.  Constitutional:      General: She is not in acute distress.    Appearance: She is not ill-appearing, toxic-appearing or diaphoretic.  HENT:     Head: Normocephalic and atraumatic.     Right Ear: Tympanic membrane, ear canal and external ear normal.     Left Ear: Ear canal and external ear normal.     Nose: Congestion present.     Mouth/Throat:     Mouth: Mucous membranes are moist.     Pharynx: Oropharynx is clear.  Eyes:     General:        Right eye: Discharge (clear, watery) present.        Left eye: Discharge (clear, watery) present. Cardiovascular:     Rate and Rhythm: Normal rate and regular  rhythm.  Pulmonary:     Effort: Pulmonary effort is normal. No tachypnea.     Breath sounds: Normal breath sounds. No decreased breath sounds, wheezing, rhonchi or rales.  Chest:     Chest wall: No tenderness.  Musculoskeletal:     Cervical back: Normal range of motion.  Skin:    General: Skin is warm and dry.  Neurological:     General: No focal deficit present.     Mental Status: She is alert and oriented to person, place, and time.  Psychiatric:        Mood and Affect: Mood normal.        Behavior: Behavior normal.     BP 123/75   Pulse 88   Temp 97.9 F (36.6 C) (Temporal)   SpO2 100% Comment: room air Wt Readings from Last 3 Encounters:  12/11/20 236 lb 4 oz (107.2 kg)  09/09/20 242 lb (109.8 kg)  03/06/20 240 lb (108.9 kg)    There are no preventive care reminders to display for this patient.  There are no preventive care reminders to display for this patient.   Lab Results  Component Value Date   TSH 1.230 02/14/2020   Lab Results  Component Value Date   WBC 11.3 (H) 02/14/2020   HGB 14.4 02/14/2020   HCT 41.8 02/14/2020   MCV 87 02/14/2020   PLT 329 02/14/2020   Lab Results  Component Value Date   NA 137 02/14/2020   K 4.1 02/14/2020   CO2 18 (L) 02/14/2020   GLUCOSE 80 02/14/2020   BUN 10 02/14/2020   CREATININE 0.64 02/14/2020   BILITOT <0.2 02/14/2020   ALKPHOS 91 02/14/2020   AST 14 02/14/2020   ALT 10 02/14/2020   PROT 7.1 02/14/2020   ALBUMIN 4.3 02/14/2020   CALCIUM 10.0 02/14/2020   Lab Results  Component Value Date   CHOL 179 02/14/2020   Lab Results  Component Value Date   HDL 42 02/14/2020   Lab Results  Component Value Date   LDLCALC 95 02/14/2020   Lab Results  Component Value Date   TRIG 246 (H) 02/14/2020   Lab Results  Component Value Date   CHOLHDL 4.3 02/14/2020   No results found for: HGBA1C     Assessment & Plan:   Kelly Campbell was seen today for shortness of breath.  Diagnoses and all orders for this  visit:  Upper respiratory tract infection, unspecified type Negative rapid flu and strep. Covid test pending, quarantine  until results. Add flonase, continue zyrtec. Albuterol for shortness of breath. Will treat with doxycycline given length of symptoms. Continue mucinex for congestion. Stay well hydrated, rest.  -     Novel Coronavirus, NAA ([facility]) -     Veritor Flu A/B Waived -     Rapid Strep Screen ([facility] ONLY) -     albuterol (VENTOLIN HFA) 108 (90 Base) MCG/ACT inhaler; Inhale 1-2 puffs into the lungs every 6 (six) hours as needed for wheezing or shortness of breath. -     doxycycline (VIBRA-TABS) 100 MG tablet; Take 1 tablet (100 mg total) by mouth 2 (two) times daily for 10 days. 1 po bid -     fluticasone (FLONASE) 50 MCG/ACT nasal spray; Place 2 sprays into both nostrils daily.  SOB (shortness of breath) Lungs clear on exam today. Negative flu and strep. Covid pending. Albuterol prn.  -     Novel Coronavirus, NAA ([facility]) -     Veritor Flu A/B Waived -     Rapid Strep Screen ([facility] ONLY) -     albuterol (VENTOLIN HFA) 108 (90 Base) MCG/ACT inhaler; Inhale 1-2 puffs into the lungs every 6 (six) hours as needed for wheezing or shortness of breath.  Return to office for new or worsening symptoms, or if symptoms persist.   The patient indicates understanding of these issues and agrees with the plan.  Gwenlyn Perking, FNP

## 2021-01-18 LAB — SARS-COV-2, NAA 2 DAY TAT

## 2021-01-18 LAB — NOVEL CORONAVIRUS, NAA: SARS-CoV-2, NAA: NOT DETECTED

## 2021-02-04 ENCOUNTER — Ambulatory Visit (INDEPENDENT_AMBULATORY_CARE_PROVIDER_SITE_OTHER): Payer: BC Managed Care – PPO | Admitting: Family Medicine

## 2021-02-04 ENCOUNTER — Encounter: Payer: Self-pay | Admitting: Family Medicine

## 2021-02-04 ENCOUNTER — Other Ambulatory Visit: Payer: Self-pay

## 2021-02-04 VITALS — BP 117/80 | HR 76 | Ht 67.0 in | Wt 239.0 lb

## 2021-02-04 DIAGNOSIS — F339 Major depressive disorder, recurrent, unspecified: Secondary | ICD-10-CM | POA: Diagnosis not present

## 2021-02-04 DIAGNOSIS — Z3009 Encounter for other general counseling and advice on contraception: Secondary | ICD-10-CM

## 2021-02-04 DIAGNOSIS — Z01419 Encounter for gynecological examination (general) (routine) without abnormal findings: Secondary | ICD-10-CM

## 2021-02-04 DIAGNOSIS — Z136 Encounter for screening for cardiovascular disorders: Secondary | ICD-10-CM | POA: Diagnosis not present

## 2021-02-04 DIAGNOSIS — Z01411 Encounter for gynecological examination (general) (routine) with abnormal findings: Secondary | ICD-10-CM | POA: Diagnosis not present

## 2021-02-04 DIAGNOSIS — G43009 Migraine without aura, not intractable, without status migrainosus: Secondary | ICD-10-CM | POA: Diagnosis not present

## 2021-02-04 DIAGNOSIS — F32A Depression, unspecified: Secondary | ICD-10-CM

## 2021-02-04 DIAGNOSIS — F419 Anxiety disorder, unspecified: Secondary | ICD-10-CM

## 2021-02-04 MED ORDER — RIZATRIPTAN BENZOATE 10 MG PO TABS: 10.0000 mg | ORAL_TABLET | ORAL | 3 refills | Status: DC | PRN

## 2021-02-04 MED ORDER — SERTRALINE HCL 100 MG PO TABS: 150.0000 mg | ORAL_TABLET | Freq: Every day | ORAL | 3 refills | Status: DC

## 2021-02-04 MED ORDER — ETONOGESTREL-ETHINYL ESTRADIOL 0.12-0.015 MG/24HR VA RING: VAGINAL_RING | VAGINAL | 3 refills | Status: DC

## 2021-02-04 MED ORDER — BUPROPION HCL ER (XL) 150 MG PO TB24: 150.0000 mg | ORAL_TABLET | Freq: Every day | ORAL | 3 refills | Status: DC

## 2021-02-04 NOTE — Progress Notes (Signed)
BP 117/80   Pulse 76   Ht $R'5\' 7"'Wl$  (1.702 m)   Wt 239 lb (108.4 kg)   SpO2 100%   BMI 37.43 kg/m    Subjective:   Patient ID: Kelly Campbell, female    DOB: [birth year], 30 y.o.   MRN: [id number]  HPI: Natajah Derderian is a 30 y.o. female presenting on 02/04/2021 for Well women exam   HPI Well woman exam and recheck chronic medical issues. Patient is coming in for well woman exam and recheck of chronic medical issues.  She has elected not to do a pelvic exam today but does want to do a breast exam.  She says her anxiety and depression is doing very well and her migraines are under control.  She takes NuvaRing for birth control and feels like it is doing very well for her. Patient denies any chest pain, shortness of breath, headaches or vision issues, abdominal complaints, diarrhea, nausea, vomiting, or joint issues.  She does do self breast exams and denies any major issues with that.  Relevant past medical, surgical, family and social history reviewed and updated as indicated. Interim medical history since our last visit reviewed. Allergies and medications reviewed and updated.  Review of Systems  Constitutional: Negative for chills and fever.  HENT: Negative for congestion, ear discharge, ear pain and tinnitus.   Eyes: Negative for pain, redness and visual disturbance.  Respiratory: Negative for cough, chest tightness, shortness of breath and wheezing.   Cardiovascular: Negative for chest pain, palpitations and leg swelling.  Gastrointestinal: Negative for abdominal pain, blood in stool, constipation and diarrhea.  Genitourinary: Negative for difficulty urinating, dysuria and hematuria.  Musculoskeletal: Negative for back pain, gait problem and myalgias.  Skin: Negative for rash.  Neurological: Negative for dizziness, weakness, light-headedness and headaches.  Psychiatric/Behavioral: Negative for agitation, behavioral problems and suicidal ideas.  All other systems reviewed and are  negative.   Per HPI unless specifically indicated above   Allergies as of 02/04/2021      Reactions   Penicillins       Medication List       Accurate as of February 04, 2021  3:12 PM. If you have any questions, ask your nurse or doctor.        albuterol 108 (90 Base) MCG/ACT inhaler Commonly known as: VENTOLIN HFA Inhale 1-2 puffs into the lungs every 6 (six) hours as needed for wheezing or shortness of breath.   buPROPion 150 MG 24 hr tablet Commonly known as: Wellbutrin XL Take 1 tablet (150 mg total) by mouth daily.   etonogestrel-ethinyl estradiol 0.12-0.015 MG/24HR vaginal ring Commonly known as: EluRyng INSERT 1 RING VAGINALLY AND LEAVE IN PLACE FOR 3 CONSECUTIVE WEEKS THEN REMOVE FOR 1 WEEK   fluticasone 50 MCG/ACT nasal spray Commonly known as: FLONASE Place 2 sprays into both nostrils daily.   melatonin 5 MG Tabs Take 5 mg by mouth at bedtime as needed.   rizatriptan 10 MG tablet Commonly known as: Maxalt Take 1 tablet (10 mg total) by mouth as needed for migraine. May repeat in 2 hours if needed   sertraline 100 MG tablet Commonly known as: ZOLOFT Take 1.5 tablets (150 mg total) by mouth daily.   topiramate 50 MG tablet Commonly known as: Topamax Take 1 tablet (50 mg total) by mouth at bedtime as needed.        Objective:   BP 117/80   Pulse 76   Ht $R'5\' 7"'bh$  (1.702 m)   Wt 239  lb (108.4 kg)   SpO2 100%   BMI 37.43 kg/m   Wt Readings from Last 3 Encounters:  02/04/21 239 lb (108.4 kg)  12/11/20 236 lb 4 oz (107.2 kg)  09/09/20 242 lb (109.8 kg)    Physical Exam Vitals and nursing note reviewed.  Constitutional:      General: She is not in acute distress.    Appearance: She is well-developed. She is not diaphoretic.  Eyes:     Conjunctiva/sclera: Conjunctivae normal.     Pupils: Pupils are equal, round, and reactive to light.  Neck:     Thyroid: No thyromegaly.  Cardiovascular:     Rate and Rhythm: Normal rate and regular rhythm.      Heart sounds: Normal heart sounds. No murmur heard.   Pulmonary:     Effort: Pulmonary effort is normal. No respiratory distress.     Breath sounds: Normal breath sounds. No wheezing.  Chest:  Breasts: Breasts are symmetrical.     Right: No inverted nipple, mass, nipple discharge, skin change or tenderness.     Left: No inverted nipple, mass, nipple discharge, skin change or tenderness.    Abdominal:     General: Bowel sounds are normal. There is no distension.     Palpations: Abdomen is soft.     Tenderness: There is no abdominal tenderness. There is no guarding or rebound.  Musculoskeletal:        General: Normal range of motion.     Cervical back: Neck supple.  Lymphadenopathy:     Cervical: No cervical adenopathy.  Skin:    General: Skin is warm and dry.     Findings: No rash.  Neurological:     Mental Status: She is alert and oriented to person, place, and time.     Coordination: Coordination normal.  Psychiatric:        Behavior: Behavior normal.       Assessment & Plan:   Problem List Items Addressed This Visit      Other   Depression, recurrent ([facility])   Relevant Medications   sertraline (ZOLOFT) 100 MG tablet   buPROPion (WELLBUTRIN XL) 150 MG 24 hr tablet   Other Relevant Orders   CBC with Differential/Platelet   TSH   Anxiety and depression   Relevant Medications   sertraline (ZOLOFT) 100 MG tablet   buPROPion (WELLBUTRIN XL) 150 MG 24 hr tablet   Other Relevant Orders   CBC with Differential/Platelet   TSH    Other Visit Diagnoses    Well woman exam with routine gynecological exam    -  Primary   Relevant Orders   CBC with Differential/Platelet   CMP14+EGFR   Lipid panel   TSH   Migraine without aura and without status migrainosus, not intractable       Relevant Medications   sertraline (ZOLOFT) 100 MG tablet   rizatriptan (MAXALT) 10 MG tablet   buPROPion (WELLBUTRIN XL) 150 MG 24 hr tablet   Birth control counseling       Relevant  Medications   etonogestrel-ethinyl estradiol (ELURYNG) 0.12-0.015 MG/24HR vaginal ring      Continue current medication, she seems to be doing very well.  No changes.  Follow-up in 1 year for physical Follow up plan: Return in about 1 year (around 02/04/2022), or if symptoms worsen or fail to improve, for Physical exam.  Counseling provided for all of the vaccine components Orders Placed This Encounter  Procedures  . CBC with Differential/Platelet  .  CMP14+EGFR  . Lipid panel  . TSH    Caryl Pina, MD [facility] 02/04/2021, 3:12 PM

## 2021-02-05 LAB — CMP14+EGFR
ALT: 13 IU/L (ref 0–32)
AST: 15 IU/L (ref 0–40)
Albumin/Globulin Ratio: 1.8 (ref 1.2–2.2)
Albumin: 4.4 g/dL (ref 3.9–5.0)
Alkaline Phosphatase: 109 IU/L (ref 44–121)
BUN/Creatinine Ratio: 7 — ABNORMAL LOW (ref 9–23)
BUN: 5 mg/dL — ABNORMAL LOW (ref 6–20)
Bilirubin Total: <0.2 mg/dL (ref 0.0–1.2)
CO2: 21 mmol/L (ref 20–29)
Calcium: 9.6 mg/dL (ref 8.7–10.2)
Chloride: 105 mmol/L (ref 96–106)
Creatinine, Ser: 0.71 mg/dL (ref 0.57–1.00)
Globulin, Total: 2.4 g/dL (ref 1.5–4.5)
Glucose: 82 mg/dL (ref 65–99)
Potassium: 4.7 mmol/L (ref 3.5–5.2)
Sodium: 140 mmol/L (ref 134–144)
Total Protein: 6.8 g/dL (ref 6.0–8.5)
eGFR: 118 mL/min/{1.73_m2} (ref >59)

## 2021-02-05 LAB — CBC WITH DIFFERENTIAL/PLATELET
Basophils Absolute: 0.1 10*3/uL (ref 0.0–0.2)
Basos: 1 %
EOS (ABSOLUTE): 0.1 10*3/uL (ref 0.0–0.4)
Eos: 1 %
Hematocrit: 42.5 % (ref 34.0–46.6)
Hemoglobin: 14.3 g/dL (ref 11.1–15.9)
Immature Grans (Abs): 0 10*3/uL (ref 0.0–0.1)
Immature Granulocytes: 0 %
Lymphocytes Absolute: 2.7 10*3/uL (ref 0.7–3.1)
Lymphs: 26 %
MCH: 30.2 pg (ref 26.6–33.0)
MCHC: 33.6 g/dL (ref 31.5–35.7)
MCV: 90 fL (ref 79–97)
Monocytes Absolute: 0.5 10*3/uL (ref 0.1–0.9)
Monocytes: 5 %
Neutrophils Absolute: 7 10*3/uL (ref 1.4–7.0)
Neutrophils: 67 %
Platelets: 313 10*3/uL (ref 150–450)
RBC: 4.74 x10E6/uL (ref 3.77–5.28)
RDW: 12.2 % (ref 11.7–15.4)
WBC: 10.3 10*3/uL (ref 3.4–10.8)

## 2021-02-05 LAB — LIPID PANEL
Chol/HDL Ratio: 4.6 ratio — ABNORMAL HIGH (ref 0.0–4.4)
Cholesterol, Total: 189 mg/dL (ref 100–199)
HDL: 41 mg/dL (ref >39)
LDL Chol Calc (NIH): 120 mg/dL — ABNORMAL HIGH (ref 0–99)
Triglycerides: 159 mg/dL — ABNORMAL HIGH (ref 0–149)
VLDL Cholesterol Cal: 28 mg/dL (ref 5–40)

## 2021-02-05 LAB — TSH: TSH: 0.953 u[IU]/mL (ref 0.450–4.500)

## 2021-03-13 ENCOUNTER — Encounter: Payer: Self-pay | Admitting: Family

## 2021-03-13 ENCOUNTER — Ambulatory Visit (INDEPENDENT_AMBULATORY_CARE_PROVIDER_SITE_OTHER): Payer: BC Managed Care – PPO | Admitting: Family

## 2021-03-13 DIAGNOSIS — J019 Acute sinusitis, unspecified: Secondary | ICD-10-CM

## 2021-03-13 MED ORDER — DOXYCYCLINE HYCLATE 100 MG PO TABS: 100.0000 mg | ORAL_TABLET | Freq: Two times a day (BID) | ORAL | 0 refills | Status: DC

## 2021-03-13 NOTE — Progress Notes (Signed)
   Virtual Visit  Note Due to COVID-19 pandemic this visit was conducted virtually. This visit type was conducted due to national recommendations for restrictions regarding the COVID-19 Pandemic (e.g. social distancing, sheltering in place) in an effort to limit this patient's exposure and mitigate transmission in our community. All issues noted in this document were discussed and addressed.  A physical exam was not performed with this format.  I connected with Kelly Campbell on 03/13/21 at 10:15 AM  by telephone and verified that I am speaking with the correct person using two identifiers. Kelly Campbell is currently located at work and no one is currently with her during visit. The provider, Evelina Dun, FNP is located in their office at time of visit.  I discussed the limitations, risks, security and privacy concerns of performing an evaluation and management service by telephone and the availability of in person appointments. I also discussed with the patient that there may be a patient responsible charge related to this service. The patient expressed understanding and agreed to proceed.   History and Present Illness:  Sinusitis This is a new problem. The current episode started 1 to 4 weeks ago. The problem has been gradually worsening since onset. Maximum temperature: 99. Her pain is at a severity of 7/10. The pain is moderate. Associated symptoms include congestion, coughing, a hoarse voice, shortness of breath, sinus pressure, sneezing and a sore throat. Pertinent negatives include no ear pain or headaches. Past treatments include oral decongestants. The treatment provided mild relief.      Review of Systems  HENT: Positive for congestion, hoarse voice, sinus pressure, sneezing and sore throat. Negative for ear pain.   Respiratory: Positive for cough and shortness of breath.   Neurological: Negative for headaches.     Observations/Objective: No SOB or distress noted, hoarse  voice  Assessment and Plan: 1. Acute sinusitis, recurrence not specified, unspecified location - Take meds as prescribed - Use a cool mist humidifier  -Use saline nose sprays frequently -Force fluids -For any cough or congestion  Use plain Mucinex- regular strength or max strength is fine -For fever or aces or pains- take tylenol or ibuprofen. -Throat lozenges if help -RTO if symptoms worsen or do not improve  - doxycycline (VIBRA-TABS) 100 MG tablet; Take 1 tablet (100 mg total) by mouth 2 (two) times daily.  Dispense: 20 tablet; Refill: 0     I discussed the assessment and treatment plan with the patient. The patient was provided an opportunity to ask questions and all were answered. The patient agreed with the plan and demonstrated an understanding of the instructions.   The patient was advised to call back or seek an in-person evaluation if the symptoms worsen or if the condition fails to improve as anticipated.  The above assessment and management plan was discussed with the patient. The patient verbalized understanding of and has agreed to the management plan. Patient is aware to call the clinic if symptoms persist or worsen. Patient is aware when to return to the clinic for a follow-up visit. Patient educated on when it is appropriate to go to the emergency department.   Time call ended:  10:26 AM   I provided 11 minutes of  non face-to-face time during this encounter.    Evelina Dun, FNP

## 2021-03-13 NOTE — Patient Instructions (Signed)
Sinusitis, Adult Sinusitis is inflammation of your sinuses. Sinuses are hollow spaces in the bones around your face. Your sinuses are located:  Around your eyes.  In the middle of your forehead.  Behind your nose.  In your cheekbones. Mucus normally drains out of your sinuses. When your nasal tissues become inflamed or swollen, mucus can become trapped or blocked. This allows bacteria, viruses, and fungi to grow, which leads to infection. Most infections of the sinuses are caused by a virus. Sinusitis can develop quickly. It can last for up to 4 weeks (acute) or for more than 12 weeks (chronic). Sinusitis often develops after a cold. What are the causes? This condition is caused by anything that creates swelling in the sinuses or stops mucus from draining. This includes:  Allergies.  Asthma.  Infection from bacteria or viruses.  Deformities or blockages in your nose or sinuses.  Abnormal growths in the nose (nasal polyps).  Pollutants, such as chemicals or irritants in the air.  Infection from fungi (rare). What increases the risk? You are more likely to develop this condition if you:  Have a weak body defense system (immune system).  Do a lot of swimming or diving.  Overuse nasal sprays.  Smoke. What are the signs or symptoms? The main symptoms of this condition are pain and a feeling of pressure around the affected sinuses. Other symptoms include:  Stuffy nose or congestion.  Thick drainage from your nose.  Swelling and warmth over the affected sinuses.  Headache.  Upper toothache.  A cough that may get worse at night.  Extra mucus that collects in the throat or the back of the nose (postnasal drip).  Decreased sense of smell and taste.  Fatigue.  A fever.  Sore throat.  Bad breath. How is this diagnosed? This condition is diagnosed based on:  Your symptoms.  Your medical history.  A physical exam.  Tests to find out if your condition is  acute or chronic. This may include: ? Checking your nose for nasal polyps. ? Viewing your sinuses using a device that has a light (endoscope). ? Testing for allergies or bacteria. ? Imaging tests, such as an MRI or CT scan. In rare cases, a bone biopsy may be done to rule out more serious types of fungal sinus disease. How is this treated? Treatment for sinusitis depends on the cause and whether your condition is chronic or acute.  If caused by a virus, your symptoms should go away on their own within 10 days. You may be given medicines to relieve symptoms. They include: ? Medicines that shrink swollen nasal passages (topical intranasal decongestants). ? Medicines that treat allergies (antihistamines). ? A spray that eases inflammation of the nostrils (topical intranasal corticosteroids). ? Rinses that help get rid of thick mucus in your nose (nasal saline washes).  If caused by bacteria, your health care provider may recommend waiting to see if your symptoms improve. Most bacterial infections will get better without antibiotic medicine. You may be given antibiotics if you have: ? A severe infection. ? A weak immune system.  If caused by narrow nasal passages or nasal polyps, you may need to have surgery. Follow these instructions at home: Medicines  Take, use, or apply over-the-counter and prescription medicines only as told by your health care provider. These may include nasal sprays.  If you were prescribed an antibiotic medicine, take it as told by your health care provider. Do not stop taking the antibiotic even if you start   to feel better. Hydrate and humidify  Drink enough fluid to keep your urine pale yellow. Staying hydrated will help to thin your mucus.  Use a cool mist humidifier to keep the humidity level in your home above 50%.  Inhale steam for 10-15 minutes, 3-4 times a day, or as told by your health care provider. You can do this in the bathroom while a hot shower is  running.  Limit your exposure to cool or dry air.   Rest  Rest as much as possible.  Sleep with your head raised (elevated).  Make sure you get enough sleep each night. General instructions  Apply a warm, moist washcloth to your face 3-4 times a day or as told by your health care provider. This will help with discomfort.  Wash your hands often with soap and water to reduce your exposure to germs. If soap and water are not available, use hand sanitizer.  Do not smoke. Avoid being around people who are smoking (secondhand smoke).  Keep all follow-up visits as told by your health care provider. This is important.   Contact a health care provider if:  You have a fever.  Your symptoms get worse.  Your symptoms do not improve within 10 days. Get help right away if:  You have a severe headache.  You have persistent vomiting.  You have severe pain or swelling around your face or eyes.  You have vision problems.  You develop confusion.  Your neck is stiff.  You have trouble breathing. Summary  Sinusitis is soreness and inflammation of your sinuses. Sinuses are hollow spaces in the bones around your face.  This condition is caused by nasal tissues that become inflamed or swollen. The swelling traps or blocks the flow of mucus. This allows bacteria, viruses, and fungi to grow, which leads to infection.  If you were prescribed an antibiotic medicine, take it as told by your health care provider. Do not stop taking the antibiotic even if you start to feel better.  Keep all follow-up visits as told by your health care provider. This is important. This information is not intended to replace advice given to you by your health care provider. Make sure you discuss any questions you have with your health care provider. Document Revised: 02/28/2018 Document Reviewed: 02/28/2018 Elsevier Patient Education  2021 [employer] Inc.  

## 2021-05-20 ENCOUNTER — Encounter: Payer: Self-pay | Admitting: Family Medicine

## 2021-05-20 ENCOUNTER — Other Ambulatory Visit: Payer: Self-pay

## 2021-05-20 ENCOUNTER — Ambulatory Visit (INDEPENDENT_AMBULATORY_CARE_PROVIDER_SITE_OTHER): Payer: BC Managed Care – PPO | Admitting: Family Medicine

## 2021-05-20 VITALS — BP 127/89 | HR 99 | Temp 97.7°F | Ht 67.0 in | Wt 249.0 lb

## 2021-05-20 DIAGNOSIS — E559 Vitamin D deficiency, unspecified: Secondary | ICD-10-CM | POA: Diagnosis not present

## 2021-05-20 DIAGNOSIS — M2550 Pain in unspecified joint: Secondary | ICD-10-CM | POA: Diagnosis not present

## 2021-05-20 DIAGNOSIS — R6889 Other general symptoms and signs: Secondary | ICD-10-CM | POA: Diagnosis not present

## 2021-05-20 MED ORDER — METHYLPREDNISOLONE ACETATE 40 MG/ML IJ SUSP
60.0000 mg | Freq: Once | INTRAMUSCULAR | Status: AC
  Administered 2021-05-20: 60 mg via INTRAMUSCULAR

## 2021-05-20 MED ORDER — NAPROXEN 500 MG PO TABS: 500.0000 mg | ORAL_TABLET | Freq: Two times a day (BID) | ORAL | 1 refills | Status: DC

## 2021-05-20 NOTE — Progress Notes (Signed)
Subjective:  Patient ID: Kelly Campbell, female    DOB: 1991/07/09, 30 y.o.   MRN: [id number]  Patient Care Team: Dettinger, Fransisca Kaufmann, MD as PCP - General (Family Medicine)   Chief Complaint:  juvenille rheumatoid arthritis (Knees, hands, feet)   HPI: Kelly Campbell is a 30 y.o. 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 States the pain is worse with certain activities and movements. 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 Currently not on any medications other than OTC NSAIDs and tylenol. No reported injuries.   Arthritis Presents for initial visit. The disease course has been fluctuating. The condition has lasted for 6 months. She complains of pain and joint swelling. She reports no stiffness. Affected locations include the left foot, right foot, right ankle, left ankle, right wrist, left wrist, right hip, left hip, right elbow and left elbow. Her pain is at a severity of 5/10. Associated symptoms include fatigue and pain at night. Pertinent negatives include no diarrhea, dry eyes, dry mouth, dysuria, fever, pain while resting, rash, Raynaud's syndrome, uveitis or weight loss. Risk factors do not include overuse or scoliosis. Past treatments include acetaminophen, cold, heat, NSAIDs and rest. The treatment provided mild relief. Factors aggravating her arthritis include activity, lifting, sitting, gripping, abduction, descending stairs, climbing stairs, exposure to cold air and entering/exiting a vehicle.    Relevant past medical, surgical, family, and social history reviewed and updated as indicated.  Allergies and medications reviewed and updated. Date reviewed: Chart in Epic.   Past Medical History:  Diagnosis Date   Preeclampsia     Past Surgical History:  Procedure Laterality Date   CESAREAN SECTION      Social History   Socioeconomic History   Marital  status: Single    Spouse name: Not on file   Number of children: Not on file   Years of education: Not on file   Highest education level: Not on file  Occupational History   Not on file  Tobacco Use   Smoking status: Every Day    Packs/day: 0.50    Years: 5.00    Pack years: 2.50    Types: Cigarettes   Smokeless tobacco: Never  Substance and Sexual Activity   Alcohol use: No   Drug use: No   Sexual activity: Yes    Birth control/protection: Inserts  Other Topics Concern   Not on file  Social History Narrative   Not on file   Social Determinants of Health   Financial Resource Strain: Not on file  Food Insecurity: Not on file  Transportation Needs: Not on file  Physical Activity: Not on file  Stress: Not on file  Social Connections: Not on file  Intimate Partner Violence: Not on file    Outpatient Encounter Medications as of 05/20/2021  Medication Sig   buPROPion (WELLBUTRIN XL) 150 MG 24 hr tablet Take 1 tablet (150 mg total) by mouth daily.   etonogestrel-ethinyl estradiol (ELURYNG) 0.12-0.015 MG/24HR vaginal ring INSERT 1 RING VAGINALLY AND LEAVE IN PLACE FOR 3 CONSECUTIVE WEEKS THEN REMOVE FOR 1 WEEK   fluticasone (FLONASE) 50 MCG/ACT nasal spray Place 2 sprays into both nostrils daily.   melatonin 5 MG TABS Take 5 mg by mouth at bedtime as needed.   naproxen (NAPROSYN) 500 MG tablet Take 1 tablet (500 mg total) by mouth 2 (two) times daily with a meal.  rizatriptan (MAXALT) 10 MG tablet Take 1 tablet (10 mg total) by mouth as needed for migraine. May repeat in 2 hours if needed   sertraline (ZOLOFT) 100 MG tablet Take 1.5 tablets (150 mg total) by mouth daily.   topiramate (TOPAMAX) 50 MG tablet Take 1 tablet (50 mg total) by mouth at bedtime as needed.   albuterol (VENTOLIN HFA) 108 (90 Base) MCG/ACT inhaler Inhale 1-2 puffs into the lungs every 6 (six) hours as needed for wheezing or shortness of breath. (Patient not taking: Reported on 05/20/2021)   [DISCONTINUED]  doxycycline (VIBRA-TABS) 100 MG tablet Take 1 tablet (100 mg total) by mouth 2 (two) times daily.   [EXPIRED] methylPREDNISolone acetate (DEPO-MEDROL) injection 60 mg    No facility-administered encounter medications on file as of 05/20/2021.    Allergies  Allergen Reactions   Penicillins     Review of Systems  Constitutional:  Positive for activity change and fatigue. Negative for appetite change, chills, diaphoresis, fever, unexpected weight change and weight loss.  HENT: Negative.    Eyes: Negative.   Respiratory:  Negative for cough, chest tightness and shortness of breath.   Cardiovascular:  Negative for chest pain, palpitations and leg swelling.  Gastrointestinal:  Negative for blood in stool, constipation, diarrhea, nausea and vomiting.  Endocrine: Negative.  Negative for cold intolerance, heat intolerance, polydipsia and polyuria.  Genitourinary:  Negative for dysuria, frequency and urgency.  Musculoskeletal:  Positive for arthralgias, arthritis, gait problem (due to pain), joint swelling and myalgias. Negative for back pain, neck pain, neck stiffness and stiffness.  Skin: Negative.  Negative for rash.  Allergic/Immunologic: Negative.   Neurological:  Negative for dizziness and headaches.  Hematological: Negative.   Psychiatric/Behavioral:  Negative for confusion, hallucinations, sleep disturbance and suicidal ideas.   All other systems reviewed and are negative.      Objective:  BP 127/89   Pulse 99   Temp 97.7 F (36.5 C)   Ht $R'5\' 7"'ht$  (1.702 m)   Wt 249 lb (112.9 kg)   SpO2 98%   BMI 39.00 kg/m    Wt Readings from Last 3 Encounters:  05/20/21 249 lb (112.9 kg)  02/04/21 239 lb (108.4 kg)  12/11/20 236 lb 4 oz (107.2 kg)    Physical Exam Vitals and nursing note reviewed.  Constitutional:      General: She is not in acute distress.    Appearance: Normal appearance. She is well-developed and well-groomed. She is obese. She is not ill-appearing, toxic-appearing or  diaphoretic.  HENT:     Head: Normocephalic and atraumatic.     Jaw: There is normal jaw occlusion.     Right Ear: Hearing normal.     Left Ear: Hearing normal.     Nose: Nose normal.     Mouth/Throat:     Lips: Pink.     Mouth: Mucous membranes are moist.     Pharynx: Oropharynx is clear. Uvula midline.  Eyes:     General: Lids are normal.     Extraocular Movements: Extraocular movements intact.     Conjunctiva/sclera: Conjunctivae normal.     Pupils: Pupils are equal, round, and reactive to light.  Neck:     Thyroid: No thyroid mass, thyromegaly or thyroid tenderness.     Vascular: No carotid bruit or JVD.     Trachea: Trachea and phonation normal.  Cardiovascular:     Rate and Rhythm: Normal rate and regular rhythm.     Chest Wall: PMI is not displaced.  Pulses: Normal pulses.     Heart sounds: Normal heart sounds. No murmur heard.   No friction rub. No gallop.  Pulmonary:     Effort: Pulmonary effort is normal. No respiratory distress.     Breath sounds: Normal breath sounds. No wheezing.  Abdominal:     General: Bowel sounds are normal. There is no distension or abdominal bruit.     Palpations: Abdomen is soft. There is no hepatomegaly or splenomegaly.     Tenderness: There is no abdominal tenderness. There is no right CVA tenderness or left CVA tenderness.     Hernia: No hernia is present.  Musculoskeletal:        General: Normal range of motion.     Right shoulder: Normal.     Left shoulder: Normal.     Right upper arm: Normal.     Left upper arm: Normal.     Right elbow: Normal.     Left elbow: Normal.     Right forearm: Normal.     Left forearm: Normal.     Right wrist: Tenderness present. No swelling, deformity, effusion, lacerations, bony tenderness, snuff box tenderness or crepitus. Normal range of motion. Normal pulse.     Left wrist: Tenderness present. No swelling, deformity, effusion, lacerations, bony tenderness, snuff box tenderness or crepitus.  Normal range of motion. Normal pulse.     Right hand: Tenderness present. No swelling, deformity, lacerations or bony tenderness. Normal range of motion. Normal strength. Normal sensation. There is no disruption of two-point discrimination. Normal capillary refill. Normal pulse.     Left hand: Tenderness present. No swelling, lacerations or bony tenderness. Normal range of motion. Normal strength. Normal sensation. There is no disruption of two-point discrimination. Normal capillary refill. Normal pulse.     Cervical back: Normal, normal range of motion and neck supple.     Thoracic back: Normal.     Lumbar back: Normal.     Right hip: Normal.     Left hip: Normal.     Right upper leg: Normal.     Left upper leg: Normal.     Right knee: Normal.     Left knee: Normal.     Right lower leg: Normal.     Left lower leg: Normal. No edema.     Right ankle: No swelling, deformity, ecchymosis or lacerations. Tenderness present. Normal range of motion. Anterior drawer test negative. Normal pulse.     Right Achilles Tendon: Normal.     Left ankle: No swelling, deformity, ecchymosis or lacerations. Tenderness present. Normal range of motion. Anterior drawer test negative. Normal pulse.     Left Achilles Tendon: Normal.     Right foot: Normal range of motion and normal capillary refill. Tenderness present. No swelling, deformity, bunion, Charcot foot, foot drop, prominent metatarsal heads, laceration, bony tenderness or crepitus. Normal pulse.     Left foot: Normal range of motion and normal capillary refill. Tenderness present. No swelling, deformity, bunion, Charcot foot, foot drop, prominent metatarsal heads, laceration, bony tenderness or crepitus. Normal pulse.  Lymphadenopathy:     Cervical: No cervical adenopathy.  Skin:    General: Skin is warm and dry.     Capillary Refill: Capillary refill takes less than 2 seconds.     Coloration: Skin is not cyanotic, jaundiced or pale.     Findings: No  rash.  Neurological:     General: No focal deficit present.     Mental Status: She is alert and oriented  to person, place, and time.     Cranial Nerves: Cranial nerves are intact.     Sensory: Sensation is intact.     Motor: Motor function is intact.     Coordination: Coordination is intact.     Gait: Gait abnormal (antalgic).     Deep Tendon Reflexes: Reflexes are normal and symmetric.  Psychiatric:        Attention and Perception: Attention and perception normal.        Mood and Affect: Mood and affect normal.        Speech: Speech normal.        Behavior: Behavior normal. Behavior is cooperative.        Thought Content: Thought content normal.        Cognition and Memory: Cognition and memory normal.        Judgment: Judgment normal.    Results for orders placed or performed in visit on 02/04/21  CBC with Differential/Platelet  Result Value Ref Range   WBC 10.3 3.4 - 10.8 x10E3/uL   RBC 4.74 3.77 - 5.28 x10E6/uL   Hemoglobin 14.3 11.1 - 15.9 g/dL   Hematocrit 42.5 34.0 - 46.6 %   MCV 90 79 - 97 fL   MCH 30.2 26.6 - 33.0 pg   MCHC 33.6 31.5 - 35.7 g/dL   RDW 12.2 11.7 - 15.4 %   Platelets 313 150 - 450 x10E3/uL   Neutrophils 67 Not Estab. %   Lymphs 26 Not Estab. %   Monocytes 5 Not Estab. %   Eos 1 Not Estab. %   Basos 1 Not Estab. %   Neutrophils Absolute 7.0 1.4 - 7.0 x10E3/uL   Lymphocytes Absolute 2.7 0.7 - 3.1 x10E3/uL   Monocytes Absolute 0.5 0.1 - 0.9 x10E3/uL   EOS (ABSOLUTE) 0.1 0.0 - 0.4 x10E3/uL   Basophils Absolute 0.1 0.0 - 0.2 x10E3/uL   Immature Granulocytes 0 Not Estab. %   Immature Grans (Abs) 0.0 0.0 - 0.1 x10E3/uL  CMP14+EGFR  Result Value Ref Range   Glucose 82 65 - 99 mg/dL   BUN 5 (L) 6 - 20 mg/dL   Creatinine, Ser 0.71 0.57 - 1.00 mg/dL   eGFR 118 >59 mL/min/1.73   BUN/Creatinine Ratio 7 (L) 9 - 23   Sodium 140 134 - 144 mmol/L   Potassium 4.7 3.5 - 5.2 mmol/L   Chloride 105 96 - 106 mmol/L   CO2 21 20 - 29 mmol/L   Calcium 9.6 8.7 -  10.2 mg/dL   Total Protein 6.8 6.0 - 8.5 g/dL   Albumin 4.4 3.9 - 5.0 g/dL   Globulin, Total 2.4 1.5 - 4.5 g/dL   Albumin/Globulin Ratio 1.8 1.2 - 2.2   Bilirubin Total <0.2 0.0 - 1.2 mg/dL   Alkaline Phosphatase 109 44 - 121 IU/L   AST 15 0 - 40 IU/L   ALT 13 0 - 32 IU/L  Lipid panel  Result Value Ref Range   Cholesterol, Total 189 100 - 199 mg/dL   Triglycerides 159 (H) 0 - 149 mg/dL   HDL 41 >39 mg/dL   VLDL Cholesterol Cal 28 5 - 40 mg/dL   LDL Chol Calc (NIH) 120 (H) 0 - 99 mg/dL   Chol/HDL Ratio 4.6 (H) 0.0 - 4.4 ratio  TSH  Result Value Ref Range   TSH 0.953 0.450 - 4.500 uIU/mL       Pertinent labs & imaging results that were available during my care of the patient were reviewed by  me and considered in my medical decision making.  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  Diagnoses and all orders for this visit:  Multiple joint pain Multiple joint pain without known injury. History of Juvenile Arthritis. Will check arthritis panel and vit D. Naproxen twice daily for 14 days, with food. Burst of steroids in office today. Report any new or worsening symptoms. Symptomatic care discussed in detail.  -     Arthritis Panel -     VITAMIN D 25 Hydroxy (Vit-D Deficiency, Fractures) -     naproxen (NAPROSYN) 500 MG tablet; Take 1 tablet (500 mg total) by mouth 2 (two) times daily with a meal. -     methylPREDNISolone acetate (DEPO-MEDROL) injection 60 mg     Continue all other maintenance medications.  Follow up plan: Return if symptoms worsen or fail to improve.   Continue healthy lifestyle choices, including diet (rich in fruits, vegetables, and lean proteins, and low in salt and simple carbohydrates) and exercise (at least 30 minutes of moderate physical activity daily).  Educational handout given for arthritis  The above assessment and management plan was discussed with the patient. The patient verbalized understanding of and  has agreed to the management plan. Patient is aware to call the clinic if they develop any new symptoms or if symptoms persist or worsen. Patient is aware when to return to the clinic for a follow-up visit. Patient educated on when it is appropriate to go to the emergency department.   Monia Pouch, FNP-C [facility] [phone]

## 2021-05-21 ENCOUNTER — Encounter: Payer: Self-pay | Admitting: Family Medicine

## 2021-05-21 DIAGNOSIS — E559 Vitamin D deficiency, unspecified: Secondary | ICD-10-CM | POA: Insufficient documentation

## 2021-05-21 LAB — ARTHRITIS PANEL
Basophils Absolute: 0 10*3/uL (ref 0.0–0.2)
Basos: 0 %
EOS (ABSOLUTE): 0.1 10*3/uL (ref 0.0–0.4)
Eos: 1 %
Hematocrit: 39.6 % (ref 34.0–46.6)
Hemoglobin: 13.3 g/dL (ref 11.1–15.9)
Immature Grans (Abs): 0 10*3/uL (ref 0.0–0.1)
Immature Granulocytes: 0 %
Lymphocytes Absolute: 2.7 10*3/uL (ref 0.7–3.1)
Lymphs: 25 %
MCH: 28.7 pg (ref 26.6–33.0)
MCHC: 33.6 g/dL (ref 31.5–35.7)
MCV: 85 fL (ref 79–97)
Monocytes Absolute: 0.5 10*3/uL (ref 0.1–0.9)
Monocytes: 5 %
Neutrophils Absolute: 7.4 10*3/uL — ABNORMAL HIGH (ref 1.4–7.0)
Neutrophils: 69 %
Platelets: 328 10*3/uL (ref 150–450)
RBC: 4.64 x10E6/uL (ref 3.77–5.28)
RDW: 12.5 % (ref 11.7–15.4)
Rheumatoid fact SerPl-aCnc: <10 IU/mL (ref <14.0)
Sed Rate: 25 mm/hr (ref 0–32)
Uric Acid: 3.5 mg/dL (ref 2.6–6.2)
WBC: 10.8 10*3/uL (ref 3.4–10.8)

## 2021-05-21 LAB — VITAMIN D 25 HYDROXY (VIT D DEFICIENCY, FRACTURES): Vit D, 25-Hydroxy: 17.3 ng/mL — ABNORMAL LOW (ref 30.0–100.0)

## 2021-05-21 MED ORDER — VITAMIN D (ERGOCALCIFEROL) 1.25 MG (50000 UNIT) PO CAPS: 50000.0000 [IU] | ORAL_CAPSULE | ORAL | 1 refills | Status: AC

## 2021-05-21 NOTE — Addendum Note (Signed)
Addended by: Baruch Gouty on: 05/21/2021 09:10 AM   Modules accepted: Orders

## 2021-05-26 ENCOUNTER — Ambulatory Visit (INDEPENDENT_AMBULATORY_CARE_PROVIDER_SITE_OTHER): Payer: BC Managed Care – PPO | Admitting: Family Medicine

## 2021-05-26 DIAGNOSIS — R21 Rash and other nonspecific skin eruption: Secondary | ICD-10-CM | POA: Diagnosis not present

## 2021-05-26 MED ORDER — FLUCONAZOLE 150 MG PO TABS
ORAL_TABLET | ORAL | 0 refills | Status: DC
Start: 2021-05-26 — End: 2021-08-27

## 2021-05-26 MED ORDER — CLOTRIMAZOLE-BETAMETHASONE 1-0.05 % EX CREA: 1.0000 "application " | TOPICAL_CREAM | Freq: Every day | CUTANEOUS | 0 refills | Status: DC

## 2021-05-26 NOTE — Progress Notes (Signed)
   Virtual Visit  Note Due to COVID-19 pandemic this visit was conducted virtually. This visit type was conducted due to national recommendations for restrictions regarding the COVID-19 Pandemic (e.g. social distancing, sheltering in place) in an effort to limit this patient's exposure and mitigate transmission in our community. All issues noted in this document were discussed and addressed.  A physical exam was not performed with this format.  I connected with Kelly Campbell on 05/26/21 at 1602 by telephone and verified that I am speaking with the correct person using two identifiers. Kelly Campbell is currently located at home and no one is currently with her during the visit. The provider, Gwenlyn Perking, FNP is located in their office at time of visit.  I discussed the limitations, risks, security and privacy concerns of performing an evaluation and management service by telephone and the availability of in person appointments. I also discussed with the patient that there may be a patient responsible charge related to this service. The patient expressed understanding and agreed to proceed.  CC: rash  History and Present Illness:  HPI Kelly Campbell reports a rash x 6 days. The rash started on her left foot, between her toes. It has since spread to the bends on her arms, behind her knees, her axillas, and buttocks. The rash is itchy, red, and it also burns. She has used some OTC jock itch cream with some relief. She does report that she sweats frequently. She denies fever or drainage from the rash. Denies new products or foods.    ROS As per HPI.   Observations/Objective: Alert and oriented x 3. Able to speak in full sentences without difficulty.   Assessment and Plan: Kelly Campbell was seen today for rash.  Diagnoses and all orders for this visit:  Rash ? Fungal. Has responded to OTC antifungal cream. Will try diflucan as below and lotrisone cream. Return to office for new or worsening  symptoms, or if symptoms persist.  -     fluconazole (DIFLUCAN) 150 MG tablet; 1 po q week x 4 weeks -     clotrimazole-betamethasone (LOTRISONE) cream; Apply 1 application topically daily.    Follow Up Instructions: As needed.    I discussed the assessment and treatment plan with the patient. The patient was provided an opportunity to ask questions and all were answered. The patient agreed with the plan and demonstrated an understanding of the instructions.   The patient was advised to call back or seek an in-person evaluation if the symptoms worsen or if the condition fails to improve as anticipated.  The above assessment and management plan was discussed with the patient. The patient verbalized understanding of and has agreed to the management plan. Patient is aware to call the clinic if symptoms persist or worsen. Patient is aware when to return to the clinic for a follow-up visit. Patient educated on when it is appropriate to go to the emergency department.   Time call ended:  1613  I provided 11 minutes of  non face-to-face time during this encounter.    Gwenlyn Perking, FNP

## 2021-05-27 ENCOUNTER — Encounter: Payer: Self-pay | Admitting: Family Medicine

## 2021-05-27 ENCOUNTER — Ambulatory Visit: Payer: BC Managed Care – PPO | Admitting: Family Medicine

## 2021-08-12 ENCOUNTER — Other Ambulatory Visit: Payer: Self-pay | Admitting: Family Medicine

## 2021-08-12 DIAGNOSIS — F419 Anxiety disorder, unspecified: Secondary | ICD-10-CM

## 2021-08-12 DIAGNOSIS — F339 Major depressive disorder, recurrent, unspecified: Secondary | ICD-10-CM

## 2021-08-12 DIAGNOSIS — F32A Depression, unspecified: Secondary | ICD-10-CM

## 2021-08-13 NOTE — Telephone Encounter (Signed)
oV 02/04/21 rtc 1 yr

## 2021-08-15 ENCOUNTER — Telehealth: Payer: Self-pay | Admitting: Family Medicine

## 2021-08-15 DIAGNOSIS — F339 Major depressive disorder, recurrent, unspecified: Secondary | ICD-10-CM

## 2021-08-15 DIAGNOSIS — F32A Depression, unspecified: Secondary | ICD-10-CM

## 2021-08-15 DIAGNOSIS — F419 Anxiety disorder, unspecified: Secondary | ICD-10-CM

## 2021-08-15 MED ORDER — SERTRALINE HCL 100 MG PO TABS: ORAL_TABLET | ORAL | 1 refills | Status: DC

## 2021-08-15 NOTE — Telephone Encounter (Signed)
  Prescription Request  08/15/2021  Is this a "Controlled Substance" medicine? No  Have you seen your PCP in the last 2 weeks? no  If YES, route message to pool  -  If NO, patient needs to be scheduled for appointment.  What is the name of the medication or equipment? Sertraline-100mg   Have you contacted your pharmacy to request a refill? yes  Which pharmacy would you like this sent to? [facility]   Patient notified that their request is being sent to the clinical staff for review and that they should receive a response within 2 business days.    Dettinger's pt.  Pt states that she was told to come back in one year from April 2022.

## 2021-08-15 NOTE — Telephone Encounter (Signed)
Refill sent.

## 2021-08-27 ENCOUNTER — Other Ambulatory Visit: Payer: Self-pay

## 2021-08-27 ENCOUNTER — Encounter: Payer: Self-pay | Admitting: Family Medicine

## 2021-08-27 ENCOUNTER — Ambulatory Visit: Payer: BC Managed Care – PPO | Admitting: Family Medicine

## 2021-08-27 VITALS — BP 126/82 | HR 95 | Ht 67.0 in | Wt 244.0 lb

## 2021-08-27 DIAGNOSIS — F419 Anxiety disorder, unspecified: Secondary | ICD-10-CM | POA: Diagnosis not present

## 2021-08-27 DIAGNOSIS — F339 Major depressive disorder, recurrent, unspecified: Secondary | ICD-10-CM

## 2021-08-27 DIAGNOSIS — F32A Depression, unspecified: Secondary | ICD-10-CM

## 2021-08-27 DIAGNOSIS — F4310 Post-traumatic stress disorder, unspecified: Secondary | ICD-10-CM

## 2021-08-27 DIAGNOSIS — G43009 Migraine without aura, not intractable, without status migrainosus: Secondary | ICD-10-CM

## 2021-08-27 MED ORDER — BUPROPION HCL ER (XL) 300 MG PO TB24: 300.0000 mg | ORAL_TABLET | Freq: Every day | ORAL | 1 refills | Status: DC

## 2021-08-27 MED ORDER — HYDROXYZINE PAMOATE 25 MG PO CAPS: 25.0000 mg | ORAL_CAPSULE | Freq: Two times a day (BID) | ORAL | 1 refills | Status: DC | PRN

## 2021-08-27 MED ORDER — TOPIRAMATE 100 MG PO TABS: 100.0000 mg | ORAL_TABLET | Freq: Every evening | ORAL | 1 refills | Status: DC | PRN

## 2021-08-27 NOTE — Progress Notes (Signed)
BP 126/82   Pulse 95   Ht 5\' 7"  (1.702 m)   Wt 244 lb (110.7 kg)   SpO2 97%   BMI 38.22 kg/m    Subjective:   Patient ID: Kelly Campbell, female    DOB: April 30, 1991, 30 y.o.   MRN: [id number]  HPI: Kelly Campbell is a 30 y.o. female presenting on 08/27/2021 for Medical Management of Chronic Issues and Anxiety   HPI Anxiety and attention and PTSD Patient says she has been struggling a lot more with a lot of different things recently including her migraines have been a little more frequent, its mostly between the front of her head and her right eye but that is about what they usually are but she has been having it 1-2 times per week and they have been severe and lasting a couple days.  She would like to increase the Topamax see if it helps with that.  She also says she has been having difficulty with obsessing over things and feels judged by others around her and she feels like she is having focus issues.  She feels like she is not sleeping well and even when she does sleep a full 8 hours she wakes up exhausted.  She feels overwhelmed daily by things at work and at home and more irritable.  She feels like she is distracted easily by sounds or smells and she cannot focus when there are strong smells.  She does have difficulties with lights and sounds with her migraines and some nausea.  Relevant past medical, surgical, family and social history reviewed and updated as indicated. Interim medical history since our last visit reviewed. Allergies and medications reviewed and updated.  Review of Systems  Constitutional:  Negative for chills and fever.  Eyes:  Negative for visual disturbance.  Respiratory:  Negative for chest tightness and shortness of breath.   Cardiovascular:  Negative for chest pain and leg swelling.  Skin:  Negative for rash.  Neurological:  Positive for headaches. Negative for dizziness, weakness and light-headedness.  Psychiatric/Behavioral:  Positive for decreased  concentration, dysphoric mood and sleep disturbance. Negative for agitation, behavioral problems, self-injury and suicidal ideas. The patient is nervous/anxious.   All other systems reviewed and are negative.  Per HPI unless specifically indicated above   Allergies as of 08/27/2021       Reactions   Penicillins         Medication List        Accurate as of August 27, 2021  8:29 AM. If you have any questions, ask your nurse or doctor.          STOP taking these medications    clotrimazole-betamethasone cream Commonly known as: LOTRISONE Stopped by: Fransisca Kaufmann Pernell Dikes, MD   fluconazole 150 MG tablet Commonly known as: Diflucan Stopped by: Fransisca Kaufmann Loneta Tamplin, MD       TAKE these medications    albuterol 108 (90 Base) MCG/ACT inhaler Commonly known as: VENTOLIN HFA Inhale 1-2 puffs into the lungs every 6 (six) hours as needed for wheezing or shortness of breath.   buPROPion 150 MG 24 hr tablet Commonly known as: Wellbutrin XL Take 1 tablet (150 mg total) by mouth daily.   etonogestrel-ethinyl estradiol 0.12-0.015 MG/24HR vaginal ring Commonly known as: EluRyng INSERT 1 RING VAGINALLY AND LEAVE IN PLACE FOR 3 CONSECUTIVE WEEKS THEN REMOVE FOR 1 WEEK   fluticasone 50 MCG/ACT nasal spray Commonly known as: FLONASE Place 2 sprays into both nostrils daily.   melatonin  5 MG Tabs Take 5 mg by mouth at bedtime as needed.   naproxen 500 MG tablet Commonly known as: Naprosyn Take 1 tablet (500 mg total) by mouth 2 (two) times daily with a meal.   rizatriptan 10 MG tablet Commonly known as: Maxalt Take 1 tablet (10 mg total) by mouth as needed for migraine. May repeat in 2 hours if needed   sertraline 100 MG tablet Commonly known as: ZOLOFT TAKE 1 & 1/2 (ONE & ONE-HALF) TABLETS BY MOUTH ONCE DAILY   topiramate 50 MG tablet Commonly known as: Topamax Take 1 tablet (50 mg total) by mouth at bedtime as needed.         Objective:   BP 126/82   Pulse 95    Ht 5\' 7"  (1.702 m)   Wt 244 lb (110.7 kg)   SpO2 97%   BMI 38.22 kg/m   Wt Readings from Last 3 Encounters:  08/27/21 244 lb (110.7 kg)  05/20/21 249 lb (112.9 kg)  02/04/21 239 lb (108.4 kg)    Physical Exam Vitals and nursing note reviewed.  Constitutional:      General: She is not in acute distress.    Appearance: She is well-developed. She is not diaphoretic.  Eyes:     Conjunctiva/sclera: Conjunctivae normal.  Skin:    General: Skin is warm and dry.     Findings: No rash.  Neurological:     Mental Status: She is alert and oriented to person, place, and time.     Coordination: Coordination normal.  Psychiatric:        Behavior: Behavior normal.      Assessment & Plan:   Problem List Items Addressed This Visit       Other   Depression, recurrent ([place]) - Primary   Relevant Medications   buPROPion (WELLBUTRIN XL) 300 MG 24 hr tablet   hydrOXYzine (VISTARIL) 25 MG capsule   Other Relevant Orders   Ambulatory referral to Psychiatry   Anxiety and depression   Relevant Medications   buPROPion (WELLBUTRIN XL) 300 MG 24 hr tablet   hydrOXYzine (VISTARIL) 25 MG capsule   Other Relevant Orders   Ambulatory referral to Psychiatry   PTSD (post-traumatic stress disorder)   Relevant Medications   buPROPion (WELLBUTRIN XL) 300 MG 24 hr tablet   hydrOXYzine (VISTARIL) 25 MG capsule   Other Relevant Orders   Ambulatory referral to Psychiatry   Other Visit Diagnoses     Migraine without aura and without status migrainosus, not intractable       Relevant Medications   topiramate (TOPAMAX) 100 MG tablet   buPROPion (WELLBUTRIN XL) 300 MG 24 hr tablet       Will increase Wellbutrin 300 mg and increase Topamax to help with migraines.  Also gave her Vistaril as needed to help her sleep and to help with severe anxiety. Follow up plan: No follow-ups on file.  Counseling provided for all of the vaccine components No orders of the defined types were placed in this  encounter.   Caryl Pina, MD [facility] 08/27/2021, 8:29 AM

## 2021-10-03 ENCOUNTER — Ambulatory Visit: Payer: BC Managed Care – PPO | Admitting: Family Medicine

## 2021-10-03 ENCOUNTER — Telehealth: Payer: Self-pay | Admitting: Family Medicine

## 2021-10-03 ENCOUNTER — Encounter: Payer: Self-pay | Admitting: Family Medicine

## 2021-10-03 VITALS — BP 139/97 | HR 106 | Ht 67.0 in | Wt 247.0 lb

## 2021-10-03 DIAGNOSIS — F32A Depression, unspecified: Secondary | ICD-10-CM | POA: Diagnosis not present

## 2021-10-03 DIAGNOSIS — F419 Anxiety disorder, unspecified: Secondary | ICD-10-CM

## 2021-10-03 DIAGNOSIS — F339 Major depressive disorder, recurrent, unspecified: Secondary | ICD-10-CM

## 2021-10-03 DIAGNOSIS — M2559 Pain in other specified joint: Secondary | ICD-10-CM

## 2021-10-03 MED ORDER — CELECOXIB 200 MG PO CAPS: 200.0000 mg | ORAL_CAPSULE | Freq: Two times a day (BID) | ORAL | 3 refills | Status: DC

## 2021-10-03 NOTE — Telephone Encounter (Signed)
(  Key: [id number]) PA for Celebrex  This request has received a Favorable outcome.  Please note any additional information provided by [employer] at the bottom of this request.   Pharmacy aware.

## 2021-10-03 NOTE — Progress Notes (Signed)
BP (!) 139/97    Pulse (!) 106    Ht 5\' 7"  (1.702 m)    Wt 247 lb (112 kg)    SpO2 99%    BMI 38.69 kg/m    Subjective:   Patient ID: Kelly Campbell, female    DOB: Oct 08, 1991, 30 y.o.   MRN: [id number]  HPI: Kelly Campbell is a 30 y.o. female presenting on 10/03/2021 for No chief complaint on file.   HPI Depression anxiety recheck Patient is coming in for recheck of anxiety and depression.  She is taking Wellbutrin and Zoloft and Topamax and feels like they are doing very well for her in controlling her migraines as well.  She feels like things are going well and would like to continue forward with medicine.  She is still waiting on referral to psych because she could not afford the initial payment. Depression screen [facility] 2/9 10/03/2021 08/27/2021 05/20/2021 02/04/2021 12/11/2020  Decreased Interest 1 1 1  0 0  Down, Depressed, Hopeless 1 0 0 0 0  PHQ - 2 Score 2 1 1  0 0  Altered sleeping 1 2 2  - -  Tired, decreased energy 1 3 2  - -  Change in appetite 0 0 1 - -  Feeling bad or failure about yourself  1 1 0 - -  Trouble concentrating 2 2 1  - -  Moving slowly or fidgety/restless 1 1 0 - -  Suicidal thoughts 0 0 0 - -  PHQ-9 Score 8 10 7  - -  Difficult doing work/chores - - Not difficult at all - -  Some recent data might be hidden    Patient has arthralgias involving hands feet and has been taking naproxen it does not help and she would like to switch.  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  She will try and get the records of that from her pediatrician's office because we do not have those records here.  Relevant past medical, surgical, family and social history reviewed and updated as indicated. Interim medical history since our last visit reviewed. Allergies and medications reviewed and updated.  Review of Systems  Constitutional:  Negative for chills and fever.  Eyes:  Negative for redness and visual disturbance.  Respiratory:   Negative for chest tightness and shortness of breath.   Cardiovascular:  Negative for chest pain and leg swelling.  Musculoskeletal:  Positive for arthralgias. Negative for back pain and gait problem.  Skin:  Negative for rash.  Neurological:  Negative for light-headedness and headaches.  Psychiatric/Behavioral:  Positive for dysphoric mood. Negative for agitation, behavioral problems, self-injury, sleep disturbance and suicidal ideas. The patient is nervous/anxious.   All other systems reviewed and are negative.  Per HPI unless specifically indicated above   Allergies as of 10/03/2021       Reactions   Penicillins         Medication List        Accurate as of October 03, 2021  2:04 PM. If you have any questions, ask your nurse or doctor.          albuterol 108 (90 Base) MCG/ACT inhaler Commonly known as: VENTOLIN HFA Inhale 1-2 puffs into the lungs every 6 (six) hours as needed for wheezing or shortness of breath.   buPROPion 300 MG 24 hr tablet Commonly known as: Wellbutrin XL Take 1 tablet (300 mg total) by mouth daily.   etonogestrel-ethinyl estradiol 0.12-0.015 MG/24HR vaginal  ring Commonly known as: EluRyng INSERT 1 RING VAGINALLY AND LEAVE IN PLACE FOR 3 CONSECUTIVE WEEKS THEN REMOVE FOR 1 WEEK   fluticasone 50 MCG/ACT nasal spray Commonly known as: FLONASE Place 2 sprays into both nostrils daily.   hydrOXYzine 25 MG capsule Commonly known as: VISTARIL Take 1 capsule (25 mg total) by mouth 2 (two) times daily as needed.   melatonin 5 MG Tabs Take 5 mg by mouth at bedtime as needed.   naproxen 500 MG tablet Commonly known as: Naprosyn Take 1 tablet (500 mg total) by mouth 2 (two) times daily with a meal.   rizatriptan 10 MG tablet Commonly known as: Maxalt Take 1 tablet (10 mg total) by mouth as needed for migraine. May repeat in 2 hours if needed   sertraline 100 MG tablet Commonly known as: ZOLOFT TAKE 1 & 1/2 (ONE & ONE-HALF) TABLETS BY MOUTH  ONCE DAILY   topiramate 100 MG tablet Commonly known as: Topamax Take 1 tablet (100 mg total) by mouth at bedtime as needed.         Objective:   BP (!) 139/97    Pulse (!) 106    Ht 5\' 7"  (1.702 m)    Wt 247 lb (112 kg)    SpO2 99%    BMI 38.69 kg/m   Wt Readings from Last 3 Encounters:  10/03/21 247 lb (112 kg)  08/27/21 244 lb (110.7 kg)  05/20/21 249 lb (112.9 kg)    Physical Exam Vitals and nursing note reviewed.  Constitutional:      General: She is not in acute distress.    Appearance: She is well-developed. She is not diaphoretic.  Eyes:     Conjunctiva/sclera: Conjunctivae normal.  Cardiovascular:     Rate and Rhythm: Normal rate and regular rhythm.     Heart sounds: Normal heart sounds. No murmur heard. Pulmonary:     Effort: Pulmonary effort is normal. No respiratory distress.     Breath sounds: Normal breath sounds. No wheezing.  Musculoskeletal:        General: No tenderness. Normal range of motion.  Skin:    General: Skin is warm and dry.     Findings: No rash.  Neurological:     Mental Status: She is alert and oriented to person, place, and time.     Coordination: Coordination normal.  Psychiatric:        Behavior: Behavior normal.      Assessment & Plan:   Problem List Items Addressed This Visit       Other   Depression, recurrent ([place]) - Primary   Anxiety and depression   Other Visit Diagnoses     Pain in other joint       Relevant Medications   celecoxib (CELEBREX) 200 MG capsule       Continue current medicine for anxiety and depression.  Will change to Celebrex instead of naproxen Follow up plan: No follow-ups on file.  Counseling provided for all of the vaccine components No orders of the defined types were placed in this encounter.   Caryl Pina, MD [facility] 10/03/2021, 2:04 PM

## 2021-10-06 ENCOUNTER — Emergency Department (HOSPITAL_COMMUNITY): Payer: BC Managed Care – PPO

## 2021-10-06 ENCOUNTER — Other Ambulatory Visit: Payer: Self-pay

## 2021-10-06 ENCOUNTER — Emergency Department (HOSPITAL_COMMUNITY)
Admission: EM | Admit: 2021-10-06 | Discharge: 2021-10-06 | Disposition: A | Discharge status: Home / Self Care | Payer: BC Managed Care – PPO | Attending: Emergency Medicine | Admitting: Emergency Medicine

## 2021-10-06 DIAGNOSIS — R932 Abnormal findings on diagnostic imaging of liver and biliary tract: Secondary | ICD-10-CM | POA: Diagnosis not present

## 2021-10-06 DIAGNOSIS — E871 Hypo-osmolality and hyponatremia: Secondary | ICD-10-CM | POA: Insufficient documentation

## 2021-10-06 DIAGNOSIS — M47816 Spondylosis without myelopathy or radiculopathy, lumbar region: Secondary | ICD-10-CM | POA: Diagnosis not present

## 2021-10-06 DIAGNOSIS — N200 Calculus of kidney: Secondary | ICD-10-CM | POA: Diagnosis not present

## 2021-10-06 DIAGNOSIS — Z7951 Long term (current) use of inhaled steroids: Secondary | ICD-10-CM | POA: Insufficient documentation

## 2021-10-06 DIAGNOSIS — R1011 Right upper quadrant pain: Secondary | ICD-10-CM | POA: Insufficient documentation

## 2021-10-06 DIAGNOSIS — K828 Other specified diseases of gallbladder: Secondary | ICD-10-CM | POA: Diagnosis not present

## 2021-10-06 DIAGNOSIS — F17210 Nicotine dependence, cigarettes, uncomplicated: Secondary | ICD-10-CM | POA: Diagnosis not present

## 2021-10-06 DIAGNOSIS — K8020 Calculus of gallbladder without cholecystitis without obstruction: Secondary | ICD-10-CM | POA: Diagnosis not present

## 2021-10-06 LAB — CBC WITH DIFFERENTIAL/PLATELET
Abs Immature Granulocytes: 0.02 10*3/uL (ref 0.00–0.07)
Basophils Absolute: 0 10*3/uL (ref 0.0–0.1)
Basophils Relative: 0 %
Eosinophils Absolute: 0.1 10*3/uL (ref 0.0–0.5)
Eosinophils Relative: 1 %
HCT: 39.6 % (ref 36.0–46.0)
Hemoglobin: 13.5 g/dL (ref 12.0–15.0)
Immature Granulocytes: 0 %
Lymphocytes Relative: 23 %
Lymphs Abs: 2.1 10*3/uL (ref 0.7–4.0)
MCH: 30.3 pg (ref 26.0–34.0)
MCHC: 34.1 g/dL (ref 30.0–36.0)
MCV: 88.8 fL (ref 80.0–100.0)
Monocytes Absolute: 0.5 10*3/uL (ref 0.1–1.0)
Monocytes Relative: 5 %
Neutro Abs: 6.6 10*3/uL (ref 1.7–7.7)
Neutrophils Relative %: 71 %
Platelets: 329 10*3/uL (ref 150–400)
RBC: 4.46 MIL/uL (ref 3.87–5.11)
RDW: 12.5 % (ref 11.5–15.5)
WBC: 9.3 10*3/uL (ref 4.0–10.5)
nRBC: 0 % (ref 0.0–0.2)

## 2021-10-06 LAB — URINALYSIS, ROUTINE W REFLEX MICROSCOPIC
Bilirubin Urine: NEGATIVE
Glucose, UA: NEGATIVE mg/dL
Hgb urine dipstick: NEGATIVE
Ketones, ur: NEGATIVE mg/dL
Leukocytes,Ua: NEGATIVE
Nitrite: NEGATIVE
Protein, ur: NEGATIVE mg/dL
Specific Gravity, Urine: >1.03 — ABNORMAL HIGH (ref 1.005–1.030)
pH: 6 (ref 5.0–8.0)

## 2021-10-06 LAB — COMPREHENSIVE METABOLIC PANEL
ALT: 18 U/L (ref 0–44)
AST: 17 U/L (ref 15–41)
Albumin: 3.4 g/dL — ABNORMAL LOW (ref 3.5–5.0)
Alkaline Phosphatase: 82 U/L (ref 38–126)
Anion gap: 9 (ref 5–15)
BUN: 7 mg/dL (ref 6–20)
CO2: 15 mmol/L — ABNORMAL LOW (ref 22–32)
Calcium: 8.7 mg/dL — ABNORMAL LOW (ref 8.9–10.3)
Chloride: 110 mmol/L (ref 98–111)
Creatinine, Ser: 0.61 mg/dL (ref 0.44–1.00)
GFR, Estimated: >60 mL/min (ref >60)
Glucose, Bld: 137 mg/dL — ABNORMAL HIGH (ref 70–99)
Potassium: 4.2 mmol/L (ref 3.5–5.1)
Sodium: 134 mmol/L — ABNORMAL LOW (ref 135–145)
Total Bilirubin: 0.1 mg/dL — ABNORMAL LOW (ref 0.3–1.2)
Total Protein: 6.9 g/dL (ref 6.5–8.1)

## 2021-10-06 LAB — POC URINE PREG, ED: Preg Test, Ur: NEGATIVE

## 2021-10-06 LAB — LIPASE, BLOOD: Lipase: 47 U/L (ref 11–51)

## 2021-10-06 MED ORDER — ONDANSETRON HCL 4 MG/2ML IJ SOLN
4.0000 mg | Freq: Once | INTRAMUSCULAR | Status: AC
  Administered 2021-10-06: 07:00:00 4 mg via INTRAVENOUS
  Filled 2021-10-06: qty 2

## 2021-10-06 MED ORDER — MORPHINE SULFATE (PF) 4 MG/ML IV SOLN
4.0000 mg | Freq: Once | INTRAVENOUS | Status: AC
  Administered 2021-10-06: 07:00:00 4 mg via INTRAVENOUS
  Filled 2021-10-06: qty 1

## 2021-10-06 MED ORDER — MORPHINE SULFATE (PF) 4 MG/ML IV SOLN
4.0000 mg | Freq: Once | INTRAVENOUS | Status: AC
  Administered 2021-10-06: 11:00:00 4 mg via INTRAVENOUS
  Filled 2021-10-06: qty 1

## 2021-10-06 NOTE — ED Triage Notes (Addendum)
Right upper quadrant that started at 0100 this am. Pt took Naproxen without relief. Denies fever. Denies hx of kidney stones.

## 2021-10-06 NOTE — ED Triage Notes (Signed)
Pt reports pain comes in waves accompanied by nausea at times. Pt had chili cheese fries for dinner last pm.

## 2021-10-06 NOTE — ED Provider Notes (Signed)
[facility] EMERGENCY DEPARTMENT Provider Note   CSN: [id number] Arrival date & time: 10/06/21  7858     History Chief Complaint  Patient presents with   Abdominal Pain    Kelly Campbell is a 30 y.o. female.  The history is provided by the patient.  She has history of anxiety, depression and comes in complaining of right flank pain which woke her up at about 2 AM.  Pain is severe and she rates it at 9/10.  Nothing makes it better, nothing makes it worse.  There has been associated nausea but no vomiting.  Pain radiates to the right upper abdomen.  She had a similar episode that woke her up 2 days ago which lasted through most of the night.  She does admit to eating chili fries before going to bed tonight, does not remember what she ate prior to going to bed 2 nights ago.  She denies fever, chills, sweats.  There has been no diarrhea.  She denies any urinary difficulty.  She has taken naproxen without any relief of pain.   Past Medical History:  Diagnosis Date   Preeclampsia     Patient Active Problem List   Diagnosis Date Noted   Vitamin D deficiency 05/21/2021   Encounter for surveillance of vaginal ring hormonal contraceptive device 02/14/2020   Anxiety and depression 11/10/2018   PTSD (post-traumatic stress disorder) 11/10/2018   Depression, recurrent ([place]) 01/15/2017    Past Surgical History:  Procedure Laterality Date   CESAREAN SECTION       OB History   No obstetric history on file.     Family History  Problem Relation Age of Onset   Diabetes Mother    Asthma Mother    Asthma Sister     Social History   Tobacco Use   Smoking status: Every Day    Packs/day: 0.50    Years: 5.00    Pack years: 2.50    Types: Cigarettes   Smokeless tobacco: Never  Substance Use Topics   Alcohol use: No   Drug use: No    Home Medications Prior to Admission medications   Medication Sig Start Date End Date Taking? Authorizing Provider  albuterol (VENTOLIN HFA) 108  (90 Base) MCG/ACT inhaler Inhale 1-2 puffs into the lungs every 6 (six) hours as needed for wheezing or shortness of breath. 01/16/21   Gwenlyn Perking, FNP  buPROPion (WELLBUTRIN XL) 300 MG 24 hr tablet Take 1 tablet (300 mg total) by mouth daily. 08/27/21   Dettinger, Fransisca Kaufmann, MD  celecoxib (CELEBREX) 200 MG capsule Take 1 capsule (200 mg total) by mouth 2 (two) times daily. 10/03/21   Dettinger, Fransisca Kaufmann, MD  etonogestrel-ethinyl estradiol Vevelyn Royals) 0.12-0.015 MG/24HR vaginal ring INSERT 1 RING VAGINALLY AND LEAVE IN PLACE FOR 3 CONSECUTIVE WEEKS THEN REMOVE FOR 1 WEEK 02/04/21   Dettinger, Fransisca Kaufmann, MD  fluticasone (FLONASE) 50 MCG/ACT nasal spray Place 2 sprays into both nostrils daily. 01/16/21   Gwenlyn Perking, FNP  hydrOXYzine (VISTARIL) 25 MG capsule Take 1 capsule (25 mg total) by mouth 2 (two) times daily as needed. 08/27/21   Dettinger, Fransisca Kaufmann, MD  melatonin 5 MG TABS Take 5 mg by mouth at bedtime as needed.    [provider]  rizatriptan (MAXALT) 10 MG tablet Take 1 tablet (10 mg total) by mouth as needed for migraine. May repeat in 2 hours if needed 02/04/21   Dettinger, Fransisca Kaufmann, MD  sertraline (ZOLOFT) 100 MG tablet TAKE  1 & 1/2 (ONE & ONE-HALF) TABLETS BY MOUTH ONCE DAILY 08/15/21   Dettinger, Fransisca Kaufmann, MD  topiramate (TOPAMAX) 100 MG tablet Take 1 tablet (100 mg total) by mouth at bedtime as needed. 08/27/21   Dettinger, Fransisca Kaufmann, MD    Allergies    Penicillins  Review of Systems   Review of Systems  All other systems reviewed and are negative.  Physical Exam Updated Vital Signs BP (!) 155/105    Pulse 83    Temp 98.5 F (36.9 C)    Resp (!) 24    Wt 108.9 kg    LMP 09/11/2021    SpO2 99%    BMI 37.59 kg/m   Physical Exam Vitals and nursing note reviewed.  30 year old female, resting comfortably and in no acute distress. Vital signs are significant for elevated blood pressure and respiratory rate. Oxygen saturation is 99%, which is normal. Head is  normocephalic and atraumatic. PERRLA, EOMI. Oropharynx is clear. Neck is nontender and supple without adenopathy or JVD. Back is nontender and there is no CVA tenderness. Lungs are clear without rales, wheezes, or rhonchi. Chest is nontender. Heart has regular rate and rhythm without murmur. Abdomen is soft, flat, with fairly well localized right upper quadrant tenderness with a +/- Murphy sign.  There is no rebound or guarding.  There are no masses or hepatosplenomegaly and peristalsis is hypoactive. Extremities have no cyanosis or edema, full range of motion is present. Skin is warm and dry without rash. Neurologic: Mental status is normal, cranial nerves are intact, there are no motor or sensory deficits.  ED Results / Procedures / Treatments   Labs (all labs ordered are listed, but only abnormal results are displayed) Labs Reviewed  COMPREHENSIVE METABOLIC PANEL - Abnormal; Notable for the following components:      Result Value   Sodium 134 (*)    CO2 15 (*)    Glucose, Bld 137 (*)    Calcium 8.7 (*)    Albumin 3.4 (*)    Total Bilirubin 0.1 (*)    All other components within normal limits  URINALYSIS, ROUTINE W REFLEX MICROSCOPIC - Abnormal; Notable for the following components:   Specific Gravity, Urine >1.030 (*)    All other components within normal limits  POC URINE PREG, ED - Normal  LIPASE, BLOOD  CBC WITH DIFFERENTIAL/PLATELET    Radiology No results found.  Procedures Procedures   Medications Ordered in ED Medications  ondansetron (ZOFRAN) injection 4 mg (has no administration in time range)  morphine 4 MG/ML injection 4 mg (has no administration in time range)    ED Course  I have reviewed the triage vital signs and the nursing notes.  Pertinent labs & imaging results that were available during my care of the patient were reviewed by me and considered in my medical decision making (see chart for details).    MDM Rules/Calculators/A&P                          Right upper quadrant pain and tenderness strongly suggestive of biliary colic.  Consider pancreatitis, peptic ulcer disease, diverticulitis, gastritis.  Old records reviewed, and she has no relevant past visits.  She will be given morphine for pain, ondansetron for nausea.  Screening labs are obtained and will send for right upper quadrant ultrasound.    She got good pain relief with morphine.  Labs show normal WBC with normal differential.  Metabolic panel showed  mild hyponatremia which is not felt to be clinically significant.  CO2 is significantly low at 15, but anion gap is normal at 9.  Significance of low CO2 is unclear.  Ultrasound is pending.  Case is signed out to Dr. Eulis Foster.  Final Clinical Impression(s) / ED Diagnoses Final diagnoses:  RUQ pain  Hyponatremia    Rx / DC Orders ED Discharge Orders     None        Delora Fuel, MD 56/38/93 [phone]

## 2021-10-06 NOTE — ED Provider Notes (Signed)
7:15 AM-checkout from Dr. Roxanne Mins to evaluate patient for disposition after return of ultrasound imaging.  She presented for evaluation of flank pain which she localizes to her right upper abdomen.  In evaluation was reassuring.  Testing performed includes pregnancy test, lipase, CBC, c-Met and urinalysis.  10:20 AM-labs normal.  Ultrasound limited, right upper quadrant shows only possible fatty liver.  No gallbladder abnormality.  No ductal dilatation.  At this time the patient is still uncomfortable despite being treated with morphine 4 hours ago.  She states that her pain has been almost eliminated, now is worse but not as bad as earlier when it was 9 out of 10.  We discussed her current findings, will evaluate further with CT imaging and give a second dose of morphine.   1:50 PM-I discussed CT results with her.  She has lower lumbar degenerative joint disease.  She has a stone in her right kidney no signs of hydronephrosis or passing a kidney stone.  This time she is comfortable.  She declines narcotic pain medicine at home because of family history of substance abuse.  She states that she will follow-up with her doctor about arthritis and use ibuprofen for pain.   Daleen Bo, MD 10/06/21 [phone]

## 2021-10-06 NOTE — Discharge Instructions (Signed)
Testing today does not show any serious complications from your flank and abdominal pain.  You do have some lower lumbar degenerative joint disease.  Use ibuprofen for pain and heat on sore area.  Follow-up with your doctor as needed for problems.

## 2021-10-09 ENCOUNTER — Encounter: Payer: Self-pay | Admitting: Family Medicine

## 2021-10-09 NOTE — Telephone Encounter (Unsigned)
pT. NEEDS TO SEE THE ON CALL PROVIDER TODAY OR TOMORROW. Go to E.D. if dyspnea occurs

## 2021-10-10 ENCOUNTER — Ambulatory Visit: Payer: BC Managed Care – PPO | Admitting: Family Medicine

## 2021-10-10 ENCOUNTER — Encounter: Payer: Self-pay | Admitting: Family Medicine

## 2021-10-10 VITALS — BP 118/85 | HR 92 | Temp 98.7°F | Ht 67.0 in | Wt 250.0 lb

## 2021-10-10 DIAGNOSIS — T7840XA Allergy, unspecified, initial encounter: Secondary | ICD-10-CM | POA: Diagnosis not present

## 2021-10-10 MED ORDER — PREDNISONE 20 MG PO TABS: 40.0000 mg | ORAL_TABLET | Freq: Every day | ORAL | 0 refills | Status: AC

## 2021-10-10 MED ORDER — FAMOTIDINE 20 MG PO TABS: 20.0000 mg | ORAL_TABLET | Freq: Two times a day (BID) | ORAL | 0 refills | Status: DC

## 2021-10-10 NOTE — Progress Notes (Signed)
Subjective:  Patient ID: Kelly Campbell, female    DOB: [birth year], 30 y.o.   MRN: [id number]  Patient Care Team: Dettinger, Fransisca Kaufmann, MD as PCP - General (Family Medicine)   Chief Complaint:  Medication Reaction (Morphine vs celebrex// rx from [facility] ER//Rash and itchiness)   HPI: Kelly Campbell is a 30 y.o. female presenting on 10/10/2021 for Medication Reaction (Morphine vs celebrex// rx from [facility] ER//Rash and itchiness)   Patient presents today for evaluation of pruritic rash and hives.  She was seen in the emergency department on 10/06/2021 for right upper quadrant pain and given morphine IV.  She also recently started taking Celebrex for ongoing pain.  States that rash and itchiness started on Thursday and has gradually improved.  States she had hives all over which have resolved but she continues to itch.  She denies any cough, voice change, shortness of breath, throat swelling, mouth swelling, oral wheezing.  No chest pain, abdominal pain, palpitations, dizziness, or syncope.  She was only given morphine 1 time during ED visit but has been taking Celebrex since Tuesday.     Relevant past medical, surgical, family, and social history reviewed and updated as indicated.  Allergies and medications reviewed and updated. Data reviewed: Chart in Epic.   Past Medical History:  Diagnosis Date   Preeclampsia     Past Surgical History:  Procedure Laterality Date   CESAREAN SECTION      Social History   Socioeconomic History   Marital status: Single    Spouse name: Not on file   Number of children: Not on file   Years of education: Not on file   Highest education level: Not on file  Occupational History   Not on file  Tobacco Use   Smoking status: Every Day    Packs/day: 0.50    Years: 5.00    Pack years: 2.50    Types: Cigarettes   Smokeless tobacco: Never  Substance and Sexual Activity   Alcohol use: No   Drug use: No   Sexual activity: Yes    Birth  control/protection: Inserts  Other Topics Concern   Not on file  Social History Narrative   Not on file   Social Determinants of Health   Financial Resource Strain: Not on file  Food Insecurity: Not on file  Transportation Needs: Not on file  Physical Activity: Not on file  Stress: Not on file  Social Connections: Not on file  Intimate Partner Violence: Not on file    Outpatient Encounter Medications as of 10/10/2021  Medication Sig   albuterol (VENTOLIN HFA) 108 (90 Base) MCG/ACT inhaler Inhale 1-2 puffs into the lungs every 6 (six) hours as needed for wheezing or shortness of breath.   buPROPion (WELLBUTRIN XL) 300 MG 24 hr tablet Take 1 tablet (300 mg total) by mouth daily.   celecoxib (CELEBREX) 200 MG capsule Take 1 capsule (200 mg total) by mouth 2 (two) times daily.   etonogestrel-ethinyl estradiol (ELURYNG) 0.12-0.015 MG/24HR vaginal ring INSERT 1 RING VAGINALLY AND LEAVE IN PLACE FOR 3 CONSECUTIVE WEEKS THEN REMOVE FOR 1 WEEK   famotidine (PEPCID) 20 MG tablet Take 1 tablet (20 mg total) by mouth 2 (two) times daily for 14 days.   fluticasone (FLONASE) 50 MCG/ACT nasal spray Place 2 sprays into both nostrils daily.   hydrOXYzine (VISTARIL) 25 MG capsule Take 1 capsule (25 mg total) by mouth 2 (two) times daily as needed. (Patient taking differently: Take 25 mg  by mouth 2 (two) times daily as needed for anxiety.)   predniSONE (DELTASONE) 20 MG tablet Take 2 tablets (40 mg total) by mouth daily with breakfast for 5 days.   rizatriptan (MAXALT) 10 MG tablet Take 1 tablet (10 mg total) by mouth as needed for migraine. May repeat in 2 hours if needed   sertraline (ZOLOFT) 100 MG tablet TAKE 1 & 1/2 (ONE & ONE-HALF) TABLETS BY MOUTH ONCE DAILY   topiramate (TOPAMAX) 100 MG tablet Take 1 tablet (100 mg total) by mouth at bedtime as needed.   Vitamin D, Ergocalciferol, (DRISDOL) 1.25 MG (50000 UNIT) CAPS capsule Take 50,000 Units by mouth every 7 (seven) days. Sundays   No  facility-administered encounter medications on file as of 10/10/2021.    Allergies  Allergen Reactions   Penicillins     Review of Systems  Constitutional:  Negative for activity change, appetite change, chills, diaphoresis, fatigue, fever and unexpected weight change.  HENT:  Negative for trouble swallowing and voice change.   Eyes:  Negative for photophobia and visual disturbance.  Respiratory:  Negative for apnea, cough, choking, chest tightness, shortness of breath, wheezing and stridor.   Cardiovascular:  Negative for chest pain, palpitations and leg swelling.  Gastrointestinal:  Negative for abdominal pain, diarrhea, nausea and vomiting.  Genitourinary:  Negative for decreased urine volume.  Skin:  Positive for color change and rash. Negative for pallor and wound.  Neurological:  Negative for dizziness, tremors, seizures, syncope, facial asymmetry, speech difficulty, weakness, light-headedness, numbness and headaches.  All other systems reviewed and are negative.      Objective:  BP 118/85    Pulse 92    Temp 98.7 F (37.1 C)    Ht 5\' 7"  (1.702 m)    Wt 250 lb (113.4 kg)    LMP 09/11/2021 (Exact Date)    SpO2 98%    BMI 39.16 kg/m    Wt Readings from Last 3 Encounters:  10/10/21 250 lb (113.4 kg)  10/06/21 240 lb (108.9 kg)  10/03/21 247 lb (112 kg)    Physical Exam Vitals and nursing note reviewed.  Constitutional:      General: She is not in acute distress.    Appearance: Normal appearance. She is well-developed and well-groomed. She is obese. She is not ill-appearing, toxic-appearing or diaphoretic.  HENT:     Head: Normocephalic and atraumatic.     Jaw: There is normal jaw occlusion.     Right Ear: Hearing normal.     Left Ear: Hearing normal.     Nose: Nose normal.     Mouth/Throat:     Lips: Pink.     Mouth: Mucous membranes are moist.     Pharynx: Oropharynx is clear. Uvula midline.  Eyes:     General: Lids are normal.     Extraocular Movements:  Extraocular movements intact.     Conjunctiva/sclera: Conjunctivae normal.     Pupils: Pupils are equal, round, and reactive to light.  Neck:     Thyroid: No thyroid mass, thyromegaly or thyroid tenderness.     Vascular: No carotid bruit or JVD.     Trachea: Trachea and phonation normal.  Cardiovascular:     Rate and Rhythm: Normal rate and regular rhythm.     Chest Wall: PMI is not displaced.     Pulses: Normal pulses.     Heart sounds: Normal heart sounds. No murmur heard.   No friction rub. No gallop.  Pulmonary:  Effort: Pulmonary effort is normal. No respiratory distress.     Breath sounds: Normal breath sounds. No stridor. No wheezing, rhonchi or rales.  Chest:     Chest wall: No tenderness.  Abdominal:     General: Bowel sounds are normal. There is no distension or abdominal bruit.     Palpations: Abdomen is soft. There is no hepatomegaly or splenomegaly.     Tenderness: There is no abdominal tenderness. There is no right CVA tenderness or left CVA tenderness.     Hernia: No hernia is present.  Musculoskeletal:        General: Normal range of motion.     Cervical back: Normal range of motion and neck supple.     Right lower leg: No edema.     Left lower leg: No edema.  Lymphadenopathy:     Cervical: No cervical adenopathy.  Skin:    General: Skin is warm and dry.     Capillary Refill: Capillary refill takes less than 2 seconds.     Coloration: Skin is not cyanotic, jaundiced or pale.     Findings: No rash.  Neurological:     General: No focal deficit present.     Mental Status: She is alert and oriented to person, place, and time.     Sensory: Sensation is intact.     Motor: Motor function is intact.     Coordination: Coordination is intact.     Gait: Gait is intact.     Deep Tendon Reflexes: Reflexes are normal and symmetric.  Psychiatric:        Attention and Perception: Attention and perception normal.        Mood and Affect: Mood and affect normal.         Speech: Speech normal.        Behavior: Behavior normal. Behavior is cooperative.        Thought Content: Thought content normal.        Cognition and Memory: Cognition and memory normal.        Judgment: Judgment normal.    Results for orders placed or performed during the hospital encounter of 10/06/21  Comprehensive metabolic panel  Result Value Ref Range   Sodium 134 (L) 135 - 145 mmol/L   Potassium 4.2 3.5 - 5.1 mmol/L   Chloride 110 98 - 111 mmol/L   CO2 15 (L) 22 - 32 mmol/L   Glucose, Bld 137 (H) 70 - 99 mg/dL   BUN 7 6 - 20 mg/dL   Creatinine, Ser 0.61 0.44 - 1.00 mg/dL   Calcium 8.7 (L) 8.9 - 10.3 mg/dL   Total Protein 6.9 6.5 - 8.1 g/dL   Albumin 3.4 (L) 3.5 - 5.0 g/dL   AST 17 15 - 41 U/L   ALT 18 0 - 44 U/L   Alkaline Phosphatase 82 38 - 126 U/L   Total Bilirubin 0.1 (L) 0.3 - 1.2 mg/dL   GFR, Estimated >60 >60 mL/min   Anion gap 9 5 - 15  Lipase, blood  Result Value Ref Range   Lipase 47 11 - 51 U/L  CBC with Differential  Result Value Ref Range   WBC 9.3 4.0 - 10.5 K/uL   RBC 4.46 3.87 - 5.11 MIL/uL   Hemoglobin 13.5 12.0 - 15.0 g/dL   HCT 39.6 36.0 - 46.0 %   MCV 88.8 80.0 - 100.0 fL   MCH 30.3 26.0 - 34.0 pg   MCHC 34.1 30.0 - 36.0 g/dL  RDW 12.5 11.5 - 15.5 %   Platelets 329 150 - 400 K/uL   nRBC 0.0 0.0 - 0.2 %   Neutrophils Relative % 71 %   Neutro Abs 6.6 1.7 - 7.7 K/uL   Lymphocytes Relative 23 %   Lymphs Abs 2.1 0.7 - 4.0 K/uL   Monocytes Relative 5 %   Monocytes Absolute 0.5 0.1 - 1.0 K/uL   Eosinophils Relative 1 %   Eosinophils Absolute 0.1 0.0 - 0.5 K/uL   Basophils Relative 0 %   Basophils Absolute 0.0 0.0 - 0.1 K/uL   Immature Granulocytes 0 %   Abs Immature Granulocytes 0.02 0.00 - 0.07 K/uL  Urinalysis, Routine w reflex microscopic Urine, Clean Catch  Result Value Ref Range   Color, Urine YELLOW YELLOW   APPearance CLEAR CLEAR   Specific Gravity, Urine >1.030 (H) 1.005 - 1.030   pH 6.0 5.0 - 8.0   Glucose, UA NEGATIVE NEGATIVE  mg/dL   Hgb urine dipstick NEGATIVE NEGATIVE   Bilirubin Urine NEGATIVE NEGATIVE   Ketones, ur NEGATIVE NEGATIVE mg/dL   Protein, ur NEGATIVE NEGATIVE mg/dL   Nitrite NEGATIVE NEGATIVE   Leukocytes,Ua NEGATIVE NEGATIVE  POC urine preg, ED  Result Value Ref Range   Preg Test, Ur Negative Negative       Pertinent labs & imaging results that were available during my care of the patient were reviewed by me and considered in my medical decision making.  Assessment & Plan:  Raenette was seen today for medication reaction.  Diagnoses and all orders for this visit:  Allergic reaction, initial encounter Allergic reaction likely due to Celebrex.  No indications of anaphylaxis.  Patient aware to stop Celebrex.  If symptoms resolve, do not restart Celebrex.  If symptoms persist after 2 days of being off medications, start below prescriptions.  Patient has Atarax and can take as needed for itching.  Patient aware of red flags which require emergent evaluation and treatment.  Report any new, worsening, persistent symptoms.  Follow-up in 2 weeks for reevaluation. -     famotidine (PEPCID) 20 MG tablet; Take 1 tablet (20 mg total) by mouth 2 (two) times daily for 14 days. -     predniSONE (DELTASONE) 20 MG tablet; Take 2 tablets (40 mg total) by mouth daily with breakfast for 5 days.     Continue all other maintenance medications.  Follow up plan: Return in about 2 weeks (around 10/24/2021), or if symptoms worsen or fail to improve.   Continue healthy lifestyle choices, including diet (rich in fruits, vegetables, and lean proteins, and low in salt and simple carbohydrates) and exercise (at least 30 minutes of moderate physical activity daily).  Educational handout given for contact dermatitis  The above assessment and management plan was discussed with the patient. The patient verbalized understanding of and has agreed to the management plan. Patient is aware to call the clinic if they develop  any new symptoms or if symptoms persist or worsen. Patient is aware when to return to the clinic for a follow-up visit. Patient educated on when it is appropriate to go to the emergency department.   Monia Pouch, FNP-C [facility] [phone]

## 2021-10-10 NOTE — Patient Instructions (Signed)
465 °

## 2021-10-29 ENCOUNTER — Encounter: Payer: Self-pay | Admitting: Family Medicine

## 2021-10-30 ENCOUNTER — Telehealth: Payer: Self-pay | Admitting: Family Medicine

## 2021-11-03 NOTE — Telephone Encounter (Signed)
Faxed records release to [facility]

## 2021-11-11 ENCOUNTER — Other Ambulatory Visit: Payer: Self-pay

## 2021-11-11 ENCOUNTER — Ambulatory Visit (INDEPENDENT_AMBULATORY_CARE_PROVIDER_SITE_OTHER): Payer: BC Managed Care – PPO

## 2021-11-11 ENCOUNTER — Encounter: Payer: Self-pay | Admitting: Family Medicine

## 2021-11-11 ENCOUNTER — Ambulatory Visit: Payer: BC Managed Care – PPO | Admitting: Podiatry

## 2021-11-11 DIAGNOSIS — M79671 Pain in right foot: Secondary | ICD-10-CM

## 2021-11-11 DIAGNOSIS — R2241 Localized swelling, mass and lump, right lower limb: Secondary | ICD-10-CM | POA: Diagnosis not present

## 2021-11-11 DIAGNOSIS — M7661 Achilles tendinitis, right leg: Secondary | ICD-10-CM | POA: Diagnosis not present

## 2021-11-11 DIAGNOSIS — M79672 Pain in left foot: Secondary | ICD-10-CM

## 2021-11-11 DIAGNOSIS — M7662 Achilles tendinitis, left leg: Secondary | ICD-10-CM

## 2021-11-11 MED ORDER — METHYLPREDNISOLONE 4 MG PO TBPK: ORAL_TABLET | ORAL | 0 refills | Status: DC

## 2021-11-11 NOTE — Patient Instructions (Signed)

## 2021-11-17 NOTE — Progress Notes (Signed)
Subjective:   Patient ID: Kelly Campbell, female   DOB: 31 y.o.   MRN: [id number]   HPI 31 year old female presents the office today with concerns of isolated swelling to her right second toe which started on December 22 without any injury.  She does get pain 8/10 at times and gets worse with walking.  She tried icing, resting as well as pain medicine as well as soaking.  She does think she has a history of arthritis as a child and they are working to get records for this.  She also does describe pain to the back of her heel on both sides.  No recent injury.  She has tried stretching.    No other concerns today.   Review of Systems  All other systems reviewed and are negative.  Past Medical History:  Diagnosis Date   Preeclampsia     Past Surgical History:  Procedure Laterality Date   CESAREAN SECTION       Current Outpatient Medications:    methylPREDNISolone (MEDROL DOSEPAK) 4 MG TBPK tablet, Take as directed, Disp: 21 tablet, Rfl: 0   albuterol (VENTOLIN HFA) 108 (90 Base) MCG/ACT inhaler, Inhale 1-2 puffs into the lungs every 6 (six) hours as needed for wheezing or shortness of breath., Disp: 18 g, Rfl: 0   buPROPion (WELLBUTRIN XL) 300 MG 24 hr tablet, Take 1 tablet (300 mg total) by mouth daily., Disp: 90 tablet, Rfl: 1   celecoxib (CELEBREX) 200 MG capsule, Take 1 capsule (200 mg total) by mouth 2 (two) times daily., Disp: 60 capsule, Rfl: 3   etonogestrel-ethinyl estradiol (ELURYNG) 0.12-0.015 MG/24HR vaginal ring, INSERT 1 RING VAGINALLY AND LEAVE IN PLACE FOR 3 CONSECUTIVE WEEKS THEN REMOVE FOR 1 WEEK, Disp: 12 each, Rfl: 3   famotidine (PEPCID) 20 MG tablet, Take 1 tablet (20 mg total) by mouth 2 (two) times daily for 14 days., Disp: 28 tablet, Rfl: 0   fluticasone (FLONASE) 50 MCG/ACT nasal spray, Place 2 sprays into both nostrils daily., Disp: 16 g, Rfl: 6   hydrOXYzine (VISTARIL) 25 MG capsule, Take 1 capsule (25 mg total) by mouth 2 (two) times daily as needed.  (Patient taking differently: Take 25 mg by mouth 2 (two) times daily as needed for anxiety.), Disp: 60 capsule, Rfl: 1   rizatriptan (MAXALT) 10 MG tablet, Take 1 tablet (10 mg total) by mouth as needed for migraine. May repeat in 2 hours if needed, Disp: 10 tablet, Rfl: 3   sertraline (ZOLOFT) 100 MG tablet, TAKE 1 & 1/2 (ONE & ONE-HALF) TABLETS BY MOUTH ONCE DAILY, Disp: 135 tablet, Rfl: 1   topiramate (TOPAMAX) 100 MG tablet, Take 1 tablet (100 mg total) by mouth at bedtime as needed., Disp: 90 tablet, Rfl: 1   Vitamin D, Ergocalciferol, (DRISDOL) 1.25 MG (50000 UNIT) CAPS capsule, Take 50,000 Units by mouth every 7 (seven) days. Sundays, Disp: , Rfl:   Allergies  Allergen Reactions   Penicillins          Objective:  Physical Exam  General: AAO x3, NAD  Dermatological: There is edema noted to the right second toe with faint erythema but no open sore increase in temperature.  There is no fluctuation or any obvious signs of infection.  Vascular: Dorsalis Pedis artery and Posterior Tibial artery pedal pulses are 2/4 bilateral with immedate capillary fill time.  There is no pain with calf compression, swelling, warmth, erythema.   Neruologic: Grossly intact via light touch bilateral.   Musculoskeletal: Tenderness palpation of the  posterior aspects of the calcaneus along the distal portion of the Achilles tendon on the insertion.  Equinus is present.  There is no discomfort with lateral compression of the calcaneus.  No edema.  No pain to the plantar calcaneus.  MMT 5/5.  Gait: Unassisted, Nonantalgic.       Assessment:   Right foot second toe swelling; posterior heel pain bilaterally with insertional Achilles tendinitis     Plan:  -Treatment options discussed including all alternatives, risks, and complications -Etiology of symptoms were discussed -X-rays were obtained and reviewed with the patient.  Small inferior calcification noted along the posterior calcaneus on the right  side.  Notice of acute fracture bilaterally. -For the isolated toe swelling of the second digit on the right foot I think this could be autoimmune inflammatory.  Prescribed Medrol Dosepak.  She is working with her primary care physician in regards to getting arthritic labs/records from her pediatrician.  X-rays were negative for fracture and no signs of infection otherwise. -For the posterior heel pain dispensed night splint.  Medrol Dosepak as well.  Stretching, icing daily.  Discussed wearing shoes with better arch support.  Trula Slade DPM

## 2021-12-02 ENCOUNTER — Other Ambulatory Visit: Payer: Self-pay

## 2021-12-02 ENCOUNTER — Ambulatory Visit: Payer: BC Managed Care – PPO | Admitting: Podiatry

## 2021-12-02 DIAGNOSIS — M7662 Achilles tendinitis, left leg: Secondary | ICD-10-CM | POA: Diagnosis not present

## 2021-12-02 DIAGNOSIS — M06071 Rheumatoid arthritis without rheumatoid factor, right ankle and foot: Secondary | ICD-10-CM | POA: Diagnosis not present

## 2021-12-02 DIAGNOSIS — M7661 Achilles tendinitis, right leg: Secondary | ICD-10-CM

## 2021-12-02 DIAGNOSIS — M7989 Other specified soft tissue disorders: Secondary | ICD-10-CM

## 2021-12-07 NOTE — Progress Notes (Signed)
Subjective: 31 year old female presents the office today for follow-up evaluation of isolated swelling to her right second toe which started in December 2022 as well as for pain to the back of her heel on both sides.  She states that the steroid did help some but she continues to get discomfort.  She did contact her primary care physician today to try to get records from her previous arthritis diagnosis but unable to get obtain these records.  Objective: AAO x3, NAD DP/PT pulses palpable bilaterally, CRT less than 3 seconds Swelling still noted to right second toe but today appears to be somewhat improved.  There is a fluid-filled cyst on the plantar aspect of the digit.  There is tenderness palpation on posterior aspect the calcaneus along the insertion of the Achilles tendon into the calcaneus.  There is no pain with lateral compression of calcaneus.  No deficit noted within the Achilles tendon.  MMT 5/5.  Negative Tinel sign.  No pain with calf compression, swelling, warmth, erythema  Assessment: Right second toe cyst, insertional Achilles tendinitis bilaterally  Plan: -All treatment options discussed with the patient including all alternatives, risks, complications.  -Regards to the cyst on the right second, order diagnostic ultrasound.  This will likely need to be done at [facility]. -For the heel pain we discussed stretching, icing daily.  Discussed shoe modifications and the heel lift.  Referral to physical therapy. -Referral to neurology.  She previously has been trying to obtain her records from her pediatrician when she was diagnosed with arthritis but not able to get these records. -Patient encouraged to call the office with any questions, concerns, change in symptoms.   Trula Slade DPM

## 2021-12-16 ENCOUNTER — Telehealth: Payer: Self-pay | Admitting: Podiatry

## 2021-12-16 NOTE — Telephone Encounter (Signed)
Pt called stating she called [facility] and they have not received any referrals for the ultrasound. ?

## 2021-12-23 ENCOUNTER — Other Ambulatory Visit: Payer: Self-pay | Admitting: Family Medicine

## 2022-01-02 ENCOUNTER — Encounter: Payer: Self-pay | Admitting: Family Medicine

## 2022-01-02 ENCOUNTER — Ambulatory Visit (INDEPENDENT_AMBULATORY_CARE_PROVIDER_SITE_OTHER): Payer: BC Managed Care – PPO | Admitting: Family Medicine

## 2022-01-02 VITALS — BP 135/91 | HR 103 | Wt 259.0 lb

## 2022-01-02 DIAGNOSIS — F419 Anxiety disorder, unspecified: Secondary | ICD-10-CM

## 2022-01-02 DIAGNOSIS — F4310 Post-traumatic stress disorder, unspecified: Secondary | ICD-10-CM

## 2022-01-02 DIAGNOSIS — F32A Depression, unspecified: Secondary | ICD-10-CM

## 2022-01-02 DIAGNOSIS — F339 Major depressive disorder, recurrent, unspecified: Secondary | ICD-10-CM

## 2022-01-02 DIAGNOSIS — G43009 Migraine without aura, not intractable, without status migrainosus: Secondary | ICD-10-CM | POA: Diagnosis not present

## 2022-01-02 DIAGNOSIS — J4520 Mild intermittent asthma, uncomplicated: Secondary | ICD-10-CM

## 2022-01-02 DIAGNOSIS — Z3044 Encounter for surveillance of vaginal ring hormonal contraceptive device: Secondary | ICD-10-CM

## 2022-01-02 DIAGNOSIS — Z3009 Encounter for other general counseling and advice on contraception: Secondary | ICD-10-CM

## 2022-01-02 MED ORDER — HYDROXYZINE PAMOATE 25 MG PO CAPS: 25.0000 mg | ORAL_CAPSULE | Freq: Two times a day (BID) | ORAL | 1 refills | Status: DC | PRN

## 2022-01-02 MED ORDER — ALBUTEROL SULFATE HFA 108 (90 BASE) MCG/ACT IN AERS: 1.0000 | INHALATION_SPRAY | Freq: Four times a day (QID) | RESPIRATORY_TRACT | 0 refills | Status: DC | PRN

## 2022-01-02 MED ORDER — SERTRALINE HCL 100 MG PO TABS: ORAL_TABLET | ORAL | 1 refills | Status: DC

## 2022-01-02 MED ORDER — RIZATRIPTAN BENZOATE 10 MG PO TABS: 10.0000 mg | ORAL_TABLET | ORAL | 3 refills | Status: DC | PRN

## 2022-01-02 MED ORDER — BUPROPION HCL ER (XL) 300 MG PO TB24: 300.0000 mg | ORAL_TABLET | Freq: Every day | ORAL | 1 refills | Status: DC

## 2022-01-02 MED ORDER — TOPIRAMATE 100 MG PO TABS: 100.0000 mg | ORAL_TABLET | Freq: Every evening | ORAL | 1 refills | Status: DC | PRN

## 2022-01-02 MED ORDER — ETONOGESTREL-ETHINYL ESTRADIOL 0.12-0.015 MG/24HR VA RING: VAGINAL_RING | VAGINAL | 3 refills | Status: DC

## 2022-01-02 NOTE — Progress Notes (Signed)
? ?BP (!) 135/91   Pulse (!) 103   Wt 259 lb (117.5 kg)   SpO2 98%   BMI 40.57 kg/m?   ? ?Subjective:  ? ?Patient ID: Kelly Campbell, female    DOB: 06-Nov-1990, 31 y.o.   MRN: [id number] ? ?HPI: ?Kelly Campbell is a 31 y.o. female presenting on 01/02/2022 for Medical Management of Chronic Issues, Anxiety, and Depression (Wants to talk about FMLA for anxiety/) ? ? ?HPI ?Anxiety depression recheck ?Patient is coming back for anxiety depression recheck.  She is taking Wellbutrin Topamax Zoloft and she feels like they are helping.  She is still feeling very anxious right now and has had a couple anxiety attacks over the past couple weeks because he did have a friend commit suicide recently and her husband passed doing well emotionally but having trouble getting back into the [place] and that is affecting her and her feeling off of each other with anxiety.  She denies any suicidal ideations or self but is very concerned about her husband and that does affect her.  She is trying for FMLA to help take care of him likely will want 1 day episodes per month and will try for that.  She does not have that many panic attacks itself but is more concerned about his suicidal ideations ? ?Relevant past medical, surgical, family and social history reviewed and updated as indicated. Interim medical history since our last visit reviewed. ?Allergies and medications reviewed and updated. ? ?Review of Systems  ?Constitutional:  Negative for chills and fever.  ?Eyes:  Negative for visual disturbance.  ?Respiratory:  Negative for chest tightness and shortness of breath.   ?Cardiovascular:  Negative for chest pain and leg swelling.  ?Musculoskeletal:  Negative for back pain and gait problem.  ?Skin:  Negative for rash.  ?Neurological:  Negative for light-headedness and headaches.  ?Psychiatric/Behavioral:  Negative for agitation, behavioral problems, self-injury, sleep disturbance and suicidal ideas. The patient is nervous/anxious. The patient  is not hyperactive.   ?All other systems reviewed and are negative. ? ?Per HPI unless specifically indicated above ? ? ?Allergies as of 01/02/2022   ? ?   Reactions  ? Penicillins   ? ?  ? ?  ?Medication List  ?  ? ?  ? Accurate as of January 02, 2022  1:56 PM. If you have any questions, ask your nurse or doctor.  ?  ?  ? ?  ? ?STOP taking these medications   ? ?celecoxib 200 MG capsule ?Commonly known as: CeleBREX ?Stopped by: Worthy Rancher, MD ?  ?methylPREDNISolone 4 MG Tbpk tablet ?Commonly known as: MEDROL DOSEPAK ?Stopped by: Worthy Rancher, MD ?  ? ?  ? ?TAKE these medications   ? ?albuterol 108 (90 Base) MCG/ACT inhaler ?Commonly known as: VENTOLIN HFA ?Inhale 1-2 puffs into the lungs every 6 (six) hours as needed for wheezing or shortness of breath. ?  ?buPROPion 300 MG 24 hr tablet ?Commonly known as: Wellbutrin XL ?Take 1 tablet (300 mg total) by mouth daily. ?  ?etonogestrel-ethinyl estradiol 0.12-0.015 MG/24HR vaginal ring ?Commonly known as: EluRyng ?INSERT 1 RING VAGINALLY AND LEAVE IN PLACE FOR 3 CONSECUTIVE WEEKS THEN REMOVE FOR 1 WEEK ?  ?famotidine 20 MG tablet ?Commonly known as: PEPCID ?Take 1 tablet (20 mg total) by mouth 2 (two) times daily for 14 days. ?  ?fluticasone 50 MCG/ACT nasal spray ?Commonly known as: FLONASE ?Place 2 sprays into both nostrils daily. ?  ?hydrOXYzine 25 MG capsule ?Commonly  known as: VISTARIL ?Take 1 capsule (25 mg total) by mouth 2 (two) times daily as needed. ?What changed: reasons to take this ?  ?rizatriptan 10 MG tablet ?Commonly known as: Maxalt ?Take 1 tablet (10 mg total) by mouth as needed for migraine. May repeat in 2 hours if needed ?  ?sertraline 100 MG tablet ?Commonly known as: ZOLOFT ?TAKE 1 & 1/2 (ONE & ONE-HALF) TABLETS BY MOUTH ONCE DAILY ?  ?topiramate 100 MG tablet ?Commonly known as: Topamax ?Take 1 tablet (100 mg total) by mouth at bedtime as needed. ?  ?Vitamin D (Ergocalciferol) 1.25 MG (50000 UNIT) Caps capsule ?Commonly known as:  DRISDOL ?Take 1 capsule by mouth once a week ?  ? ?  ? ? ? ?Objective:  ? ?BP (!) 135/91   Pulse (!) 103   Wt 259 lb (117.5 kg)   SpO2 98%   BMI 40.57 kg/m?   ?Wt Readings from Last 3 Encounters:  ?01/02/22 259 lb (117.5 kg)  ?10/10/21 250 lb (113.4 kg)  ?10/06/21 240 lb (108.9 kg)  ?  ?Physical Exam ?Vitals and nursing note reviewed.  ?Constitutional:   ?   General: She is not in acute distress. ?   Appearance: She is well-developed. She is not diaphoretic.  ?Eyes:  ?   Conjunctiva/sclera: Conjunctivae normal.  ?Cardiovascular:  ?   Rate and Rhythm: Normal rate and regular rhythm.  ?   Heart sounds: Normal heart sounds. No murmur heard. ?Pulmonary:  ?   Effort: Pulmonary effort is normal. No respiratory distress.  ?   Breath sounds: Normal breath sounds. No wheezing.  ?Musculoskeletal:     ?   General: No swelling or tenderness. Normal range of motion.  ?Skin: ?   General: Skin is warm and dry.  ?   Findings: No rash.  ?Neurological:  ?   Mental Status: She is alert and oriented to person, place, and time.  ?   Coordination: Coordination normal.  ?Psychiatric:     ?   Behavior: Behavior normal.  ? ? ? ? ?Assessment & Plan:  ? ?Problem List Items Addressed This Visit   ? ?  ? Other  ? Depression, recurrent ([place])  ? Relevant Medications  ? hydrOXYzine (VISTARIL) 25 MG capsule  ? buPROPion (WELLBUTRIN XL) 300 MG 24 hr tablet  ? sertraline (ZOLOFT) 100 MG tablet  ? Anxiety and depression  ? Relevant Medications  ? hydrOXYzine (VISTARIL) 25 MG capsule  ? buPROPion (WELLBUTRIN XL) 300 MG 24 hr tablet  ? sertraline (ZOLOFT) 100 MG tablet  ? PTSD (post-traumatic stress disorder) - Primary  ? Relevant Medications  ? hydrOXYzine (VISTARIL) 25 MG capsule  ? buPROPion (WELLBUTRIN XL) 300 MG 24 hr tablet  ? sertraline (ZOLOFT) 100 MG tablet  ? Encounter for surveillance of vaginal ring hormonal contraceptive device  ? ?Other Visit Diagnoses   ? ? Mild intermittent reactive airway disease without complication      ? Relevant  Medications  ? albuterol (VENTOLIN HFA) 108 (90 Base) MCG/ACT inhaler  ? Migraine without aura and without status migrainosus, not intractable      ? Relevant Medications  ? rizatriptan (MAXALT) 10 MG tablet  ? buPROPion (WELLBUTRIN XL) 300 MG 24 hr tablet  ? sertraline (ZOLOFT) 100 MG tablet  ? topiramate (TOPAMAX) 100 MG tablet  ? Birth control counseling      ? Relevant Medications  ? etonogestrel-ethinyl estradiol (ELURYNG) 0.12-0.015 MG/24HR vaginal ring  ? ?  ?  ?Continue current medicine. ?  Follow up plan: ?Return in about 3 months (around 04/04/2022), or if symptoms worsen or fail to improve, for Depression and anxiety recheck. ? ?Counseling provided for all of the vaccine components ?No orders of the defined types were placed in this encounter. ? ? ?Caryl Pina, MD ?[place] ?01/02/2022, 1:56 PM ? ? ? ? ?

## 2022-01-14 ENCOUNTER — Ambulatory Visit: Payer: BC Managed Care – PPO | Admitting: Family Medicine

## 2022-01-14 ENCOUNTER — Encounter: Payer: Self-pay | Admitting: Family Medicine

## 2022-01-14 VITALS — BP 118/84 | HR 93 | Ht 67.0 in | Wt 262.0 lb

## 2022-01-14 DIAGNOSIS — M130 Polyarthritis, unspecified: Secondary | ICD-10-CM

## 2022-01-14 MED ORDER — DICLOFENAC SODIUM 75 MG PO TBEC: 75.0000 mg | DELAYED_RELEASE_TABLET | Freq: Two times a day (BID) | ORAL | 2 refills | Status: DC

## 2022-01-14 NOTE — Progress Notes (Signed)
? ?BP 118/84   Pulse 93   Ht '5\' 7"'$  (1.702 m)   Wt 262 lb (118.8 kg)   SpO2 99%   BMI 41.04 kg/m?   ? ?Subjective:  ? ?Patient ID: Kelly Campbell, female    DOB: 26-Apr-1991, 31 y.o.   MRN: [id number] ? ?HPI: ?Kelly Campbell is a 31 y.o. female presenting on 01/14/2022 for Foot Pain (R>L, swollen right 2nd toe) ? ? ?HPI ?Patient is coming in today complaining of bilateral toe pain on both of her big toes.  She did go see a foot doctor who does think it is rheumatological in origin and has referred her to a rheumatologist.  She is coming here because she is still having a lot of pain.  She did do a short course of prednisone and it did help a little bit.  She says that she has been hurting so much that she is having trouble at work. ? ?Relevant past medical, surgical, family and social history reviewed and updated as indicated. Interim medical history since our last visit reviewed. ?Allergies and medications reviewed and updated. ? ?Review of Systems  ?Constitutional:  Negative for chills and fever.  ?Eyes:  Negative for visual disturbance.  ?Respiratory:  Negative for chest tightness and shortness of breath.   ?Cardiovascular:  Negative for chest pain and leg swelling.  ?Musculoskeletal:  Positive for arthralgias. Negative for back pain, gait problem and myalgias.  ?Skin:  Negative for rash.  ?Neurological:  Negative for light-headedness and headaches.  ?Psychiatric/Behavioral:  Negative for agitation and behavioral problems.   ?All other systems reviewed and are negative. ? ?Per HPI unless specifically indicated above ? ? ? ?Objective:  ? ?BP 118/84   Pulse 93   Ht '5\' 7"'$  (1.702 m)   Wt 262 lb (118.8 kg)   SpO2 99%   BMI 41.04 kg/m?   ?Wt Readings from Last 3 Encounters:  ?01/14/22 262 lb (118.8 kg)  ?01/02/22 259 lb (117.5 kg)  ?10/10/21 250 lb (113.4 kg)  ?  ?Physical Exam ?Vitals and nursing note reviewed.  ?Constitutional:   ?   General: She is not in acute distress. ?   Appearance: She is well-developed.  She is not diaphoretic.  ?Eyes:  ?   Conjunctiva/sclera: Conjunctivae normal.  ?Musculoskeletal:     ?   General: Normal range of motion.  ?   Right foot: Normal range of motion and normal capillary refill. Swelling and tenderness present. No deformity, bony tenderness or crepitus.  ?   Left foot: Normal range of motion and normal capillary refill. Swelling and tenderness present. No deformity, bony tenderness or crepitus.  ?Skin: ?   General: Skin is warm and dry.  ?   Findings: No erythema or rash.  ?Neurological:  ?   Mental Status: She is alert and oriented to person, place, and time.  ?   Coordination: Coordination normal.  ?Psychiatric:     ?   Behavior: Behavior normal.  ? ? ? ? ?Assessment & Plan:  ? ?Problem List Items Addressed This Visit   ?None ?Visit Diagnoses   ? ? Polyarthritis involving foot    -  Primary  ? Relevant Medications  ? diclofenac (VOLTAREN) 75 MG EC tablet  ? ?  ?Patient has an appointment with rheumatologist, will try Voltaren. ? ?Follow up plan: ?Return if symptoms worsen or fail to improve. ? ?Counseling provided for all of the vaccine components ?No orders of the defined types were placed in this encounter. ? ? ?  Caryl Pina, MD ?[place] ?01/14/2022, 5:12 PM ? ? ?  ?

## 2022-01-23 DIAGNOSIS — M25511 Pain in right shoulder: Secondary | ICD-10-CM | POA: Diagnosis not present

## 2022-01-26 ENCOUNTER — Encounter: Payer: Self-pay | Admitting: Podiatry

## 2022-01-26 ENCOUNTER — Telehealth: Payer: Self-pay | Admitting: *Deleted

## 2022-01-26 NOTE — Telephone Encounter (Signed)
Please advise 

## 2022-01-30 ENCOUNTER — Ambulatory Visit: Payer: BC Managed Care – PPO | Admitting: Podiatry

## 2022-01-30 DIAGNOSIS — M7989 Other specified soft tissue disorders: Secondary | ICD-10-CM

## 2022-01-30 DIAGNOSIS — M7662 Achilles tendinitis, left leg: Secondary | ICD-10-CM | POA: Diagnosis not present

## 2022-01-30 DIAGNOSIS — M7661 Achilles tendinitis, right leg: Secondary | ICD-10-CM | POA: Diagnosis not present

## 2022-02-03 DIAGNOSIS — M79676 Pain in unspecified toe(s): Secondary | ICD-10-CM

## 2022-02-03 NOTE — Addendum Note (Signed)
Addended by: Celesta Gentile R on: 02/03/2022 05:47 AM ? ? Modules accepted: Orders ? ?

## 2022-02-03 NOTE — Progress Notes (Signed)
Subjective: ?31 year old female presents the office today for follow-up evaluation of isolated swelling to her right second toe which started in December 2022 as well as for pain to the back of her heel on both sides.  She has not yet had the ultrasound for the toe.  Her main concern is she still gets discomfort of the back of the heels along the Achilles tendon and she at the end of the day after working comes home crying because of the discomfort.  At work she had to take breaks which does help.  She denies any recent injury or trauma.  No significant increase in swelling.  She does stand all day walking on concrete surfaces with aggravates her symptoms.  Also reports other joint pains.  She does have a appointment scheduled with rheumatology. ? ?Objective: ?AAO x3, NAD ?DP/PT pulses palpable bilaterally, CRT less than 3 seconds ?There is chronic swelling still noted to right second toe which appears to be about the same.  There is a fluid-filled cyst on the plantar aspect of the digit.  There is mild tenderness palpation on posterior aspect the calcaneus along the insertion of the Achilles tendon into the calcaneus and the left is equal to the right today.  There is no pain with lateral compression of calcaneus.  No deficit noted within the Achilles tendon.  MMT 5/5.  Negative Tinel sign.  No pain with calf compression, swelling, warmth, erythema ? ?Assessment: ?Right second toe cyst, insertional Achilles tendinitis bilaterally ? ?Plan: ?-All treatment options discussed with the patient including all alternatives, risks, complications.  ?-Regards to the cyst on the right second, order diagnostic ultrasound.  We will follow-up on this. ?-For the heel pain she has been stretching, icing.  She has been using a heel lift.  Physical therapy.  We will work on doing intermittent FMLA to help her as well. ?-Awaiting rheumatology referral. ?-Patient encouraged to call the office with any questions, concerns, change in  symptoms.  ? ?Trula Slade DPM ? ?

## 2022-02-06 ENCOUNTER — Telehealth: Payer: Self-pay | Admitting: *Deleted

## 2022-02-06 NOTE — Telephone Encounter (Addendum)
?  Called [facility] for status of appointment, they have scheduled the patient 02/09/22'@2'$ :00, arrival time is :1:45 ?Called patient and spouse , no answer,left vmessage on both lines of this appointment and number to call back if this time is not convenient for them. ?02/06/22 ?----- Message from Trula Slade, DPM sent at 02/03/2022  5:45 AM EDT ----- ?I had ordered a ultrasound for [facility] previously.  She states that she has not had this done when she called there is no order.  We apparently fax it over but is still out of it.  Can someone please follow-up on this and please confirm that they received referral?  Thank you. ? ?

## 2022-02-10 ENCOUNTER — Ambulatory Visit: Payer: BC Managed Care – PPO | Attending: Podiatry

## 2022-02-10 DIAGNOSIS — M79672 Pain in left foot: Secondary | ICD-10-CM | POA: Insufficient documentation

## 2022-02-10 DIAGNOSIS — M7662 Achilles tendinitis, left leg: Secondary | ICD-10-CM | POA: Insufficient documentation

## 2022-02-10 DIAGNOSIS — M7661 Achilles tendinitis, right leg: Secondary | ICD-10-CM | POA: Diagnosis not present

## 2022-02-10 DIAGNOSIS — M79671 Pain in right foot: Secondary | ICD-10-CM | POA: Insufficient documentation

## 2022-02-10 NOTE — Telephone Encounter (Signed)
error 

## 2022-02-10 NOTE — Therapy (Signed)
[place] ?[facility] ?[address] ?[place], [place], [address] ?Phone: [phone]   Fax:  [phone] ? ?Physical Therapy Evaluation ? ?Patient Details  ?Name: Kelly Campbell ?MRN: [id number] ?Date of Birth: 1991/02/11 ?Referring Provider (PT): Jacqualyn Posey, [place] ? ? ?Encounter Date: 02/10/2022 ? ? PT End of Session - 02/10/22 1523   ? ? Visit Number 1   ? Number of Visits 10   ? Date for PT Re-Evaluation 05/08/22   ? PT Start Time 1521   ? PT Stop Time 0962   ? PT Time Calculation (min) 42 min   ? Activity Tolerance Patient tolerated treatment well   ? Behavior During Therapy [facility] for tasks assessed/performed   ? ?  ?  ? ?  ? ? ?Past Medical History:  ?Diagnosis Date  ? Preeclampsia   ? ? ?Past Surgical History:  ?Procedure Laterality Date  ? CESAREAN SECTION    ? ? ?There were no vitals filed for this visit. ? ? ? Subjective Assessment - 02/10/22 1523   ? ? Subjective Patient reports that her both her feet have been bothering her about 2 years ago with no known cause. She feels that her pain will come and go. This most recent episode has lasted for a few months. She thinks that this is the longest episode of pain that she has experienced in her feet. She feels that it takes about 1-1.5 hours the stiffness in her feet to get better after she wakes up.   ? Pertinent History chronic back and bilateral knee pain   ? Limitations Standing;Walking   ? Patient Stated Goals reduced pain, be able to stand and walk longer   ? Currently in Pain? Yes   ? Pain Score 7    ? Pain Location Foot   ? Pain Orientation Right;Left   ? Pain Descriptors / Indicators Throbbing;Aching   ? Pain Type Chronic pain   ? Pain Radiating Towards none   ? Pain Onset More than a month ago   ? Pain Frequency Constant   ? Aggravating Factors  walking, standing, squatting, navigating steps (down is easier)   ? Pain Relieving Factors predisone, alternating heat and ice   ? Effect of Pain on Daily Activities unable to take her  dogs walking and taking her son to practice on her bad days   ? ?  ?  ? ?  ? ? ? ? ? [facility] PT Assessment - 02/10/22 0001   ? ?  ? Assessment  ? Medical Diagnosis Achilles tendonitis of both lower extremities   ? Referring Provider (PT) Jacqualyn Posey, DPM   ? Onset Date/Surgical Date --   2 years ago  ? Next MD Visit 03/13/22   ? Prior Therapy No   ?  ? Precautions  ? Precautions None   ?  ? Balance Screen  ? Has the patient fallen in the past 6 months No   ? Has the patient had a decrease in activity level because of a fear of falling?  No   ? Is the patient reluctant to leave their home because of a fear of falling?  No   ?  ? Home Environment  ? Living Environment Private residence   ? Home Access Level entry   ? Home Layout One level   ?  ? Prior Function  ? Level of Independence Independent   ? Vocation Full time employment   ? Vocation Requirements 8-10 hour shifts (walking, standing, lift and carry  up to 40 pounds)   ? Leisure crafts   ?  ? Cognition  ? Overall Cognitive Status Within Functional Limits for tasks assessed   ? Attention Focused   ? Focused Attention Appears intact   ? Memory Appears intact   ? Awareness Appears intact   ? Problem Solving Appears intact   ?  ? Observation/Other Assessments  ? Focus on Therapeutic Outcomes (FOTO)  49.05   ?  ? Sensation  ? Additional Comments Patient reports random tingling in her hands and feet with no reason   ?  ? ROM / Strength  ? AROM / PROM / Strength AROM;Strength   ?  ? AROM  ? AROM Assessment Site Ankle   ? Right/Left Ankle Left;Right   ? Right Ankle Dorsiflexion 12   ? Right Ankle Plantar Flexion 32   ? Right Ankle Inversion 44   ? Right Ankle Eversion 8   ? Left Ankle Dorsiflexion 10   ? Left Ankle Plantar Flexion 32   ? Left Ankle Inversion 36   ? Left Ankle Eversion 16   ?  ? Strength  ? Strength Assessment Site Ankle   ? Right/Left Ankle Right;Left   ? Right Ankle Dorsiflexion 4/5   ? Right Ankle Inversion 5/5   ? Right Ankle Eversion 5/5   ? Left Ankle  Dorsiflexion 4/5   ? Left Ankle Inversion 4/5   ? Left Ankle Eversion 5/5   ?  ? Palpation  ? Palpation comment TTP: bilateral plantar fascia,   ?  ? Special Tests  ?  Special Tests Ankle/Foot Special Tests   ? Ankle/Foot Special Tests  Great Toe Extension Test   ?  ? Great Toe Extension Test   ? Comments Positive on the right; negative on the left   ? ?  ?  ? ?  ? ? ? ? ? ? ? ? ? ? ? ? ? ?Objective measurements completed on examination: See above findings.  ? ? ? ? ? [facility] Adult PT Treatment/Exercise - 02/10/22 0001   ? ?  ? Manual Therapy  ? Manual Therapy Soft tissue mobilization   ? Soft tissue mobilization plantar fascia bilaterally   ? ?  ?  ? ?  ? ? ? ? ? ? ? ? ? ? ? ? ? ? ? PT Long Term Goals - 02/10/22 1904   ? ?  ? PT LONG TERM GOAL #1  ? Title Patient will be independent with her HEP.   ? Time 5   ? Period Weeks   ? Status New   ? Target Date 03/17/22   ?  ? PT LONG TERM GOAL #2  ? Title Patient will be able to complete her critical job demands without her familiar pain exceeding a 4/10.   ? Time 5   ? Period Weeks   ? Status New   ? Target Date 03/17/22   ?  ? PT LONG TERM GOAL #3  ? Title Patient will be able to squat and lift at least 15 pounds without being limited by her familiar foot pain.   ? Time 5   ? Period Weeks   ? Status New   ? Target Date 03/17/22   ? ?  ?  ? ?  ? ? ? ? ? ? ? ? ? Plan - 02/10/22 1617   ? ? Clinical Impression Statement Patient is a 31 year old female presenting to physical therapy with bilateral foot  pain along the plantar aspect of her foot. She presented with low pain severity and irritability with palpation to the plantar aspect of both feet being the only assessment which reproduced her symptoms. She exhibited minimal AROM and strength deficits bilaterally. Recommend that she continue with skilled physical therapy to address her remaining impairments to return to her prior level of function.   ? Personal Factors and Comorbidities Time since onset of  injury/illness/exacerbation;Other   ? Examination-Activity Limitations Caring for Others;Locomotion Level;Bend;Carry;Squat;Stairs;Stand;Lift   ? Examination-Participation Restrictions Occupation;Cleaning   ? Stability/Clinical Decision Making Evolving/Moderate complexity   ? Clinical Decision Making Moderate   ? Rehab Potential Good   ? PT Frequency 2x / week   ? PT Duration Other (comment)   5 weeks  ? PT Treatment/Interventions ADLs/Self Care Home Management;Cryotherapy;Electrical Stimulation;Ultrasound;Moist Heat;Functional mobility training;Therapeutic activities;Therapeutic exercise;Neuromuscular re-education;Manual techniques;Patient/family education;Passive range of motion;Dry needling;Vasopneumatic Device;Taping   ? PT Next Visit Plan nustep, manual therapy, foot intrensic strengthening, and modalities as needed   ? Consulted and Agree with Plan of Care Patient   ? ?  ?  ? ?  ? ? ?Patient will benefit from skilled therapeutic intervention in order to improve the following deficits and impairments:  Difficulty walking, Decreased activity tolerance, Pain, Decreased strength, Decreased mobility ? ?Visit Diagnosis: ?Pain in left foot ? ?Pain in right foot ? ? ? ? ?Problem List ?Patient Active Problem List  ? Diagnosis Date Noted  ? Vitamin D deficiency 05/21/2021  ? Encounter for surveillance of vaginal ring hormonal contraceptive device 02/14/2020  ? Anxiety and depression 11/10/2018  ? PTSD (post-traumatic stress disorder) 11/10/2018  ? Depression, recurrent ([place]) 01/15/2017  ? ? ?Darlin Coco, PT ?02/10/2022, 7:07 PM ? ?[place] ?[facility] ?[address] ?[place], [place], [address] ?Phone: [phone]   Fax:  [phone] ? ?Name: Kelly Campbell ?MRN: [id number] ?Date of Birth: 18-Dec-1990 ? ? ?

## 2022-02-13 DIAGNOSIS — M7989 Other specified soft tissue disorders: Secondary | ICD-10-CM | POA: Diagnosis not present

## 2022-02-15 NOTE — Progress Notes (Signed)
? ?Office Visit Note ? ?Patient: Kelly Campbell             ?Date of Birth: 04-08-1991           ?MRN: [id number]             ?PCP: Dettinger, Fransisca Kaufmann, MD ?Referring: Trula Slade, DPM ?Visit Date: 02/18/2022 ? ? ?Subjective:  ?New Patient (Initial Visit) (Right 2nd digit toe swelling) ? ? ?History of Present Illness: Kelly Campbell is a 31 y.o. female here for history of RA with multiple joint pains, and more recently toe swelling seen in podiatry clinic.  She had joint pain in multiple areas ongoing for at least a year affecting hands back hips knees and feet on both sides.  She has significant morning stiffness this improves somewhat during the day but has a lot of pain later in the day as well.  Sometimes associated with hand swelling and inability to tightly close her grip.  She gets swelling in her knees feels that sometimes goes more into the shin and lower leg.  She is on her feet on concrete surfaces during the workday and has increased pain in the bottoms of both feet with prolonged time standing and walking.  Problems were much worse since December of last year she was seen at the ED for severe abdominal pain work-up was pretty benign did receive some medications for pain relief.  Afterwards had new skin rashes thought likely to be allergic reaction.  Symptoms also had increased in the foot pain and stiffness and developed the swelling changes in her right second toe.  She was tried oral NSAIDs including 75 mg diclofenac with no large improvement.  Oral prednisone helped her symptoms while taking the medicine.  Xray of bilateral feet without significant arthritis changes to explain degree of reported symptoms. ?She is not clear about details for the history of reported JRA.  She says she was seen at [facility] for diagnosis of their evaluation but unable to obtain records.  She reports her treatment was primarily oral NSAIDs at the time does not recall being on long-term disease specific  medication.  Does not have history of inflammatory eye problems. She maintains persistent redness in the face but no chronic skin rashes elsewhere.  ? ?Activities of Daily Living:  ?Patient reports morning stiffness for 2 hours.   ?Patient Reports nocturnal pain.  ?Difficulty dressing/grooming: Reports ?Difficulty climbing stairs: Reports ?Difficulty getting out of chair: Reports ?Difficulty using hands for taps, buttons, cutlery, and/or writing: Reports ? ?Review of Systems  ?Constitutional:  Positive for fatigue.  ?HENT:  Negative for mouth dryness.   ?Eyes:  Positive for dryness.  ?Respiratory:  Negative for shortness of breath.   ?Cardiovascular:  Positive for swelling in legs/feet.  ?Gastrointestinal:  Positive for constipation and diarrhea.  ?Endocrine: Positive for cold intolerance, heat intolerance, excessive thirst and increased urination.  ?Genitourinary:  Negative for difficulty urinating.  ?Musculoskeletal:  Positive for joint pain, joint pain, joint swelling, muscle weakness, morning stiffness and muscle tenderness.  ?Skin:  Negative for rash.  ?Allergic/Immunologic: Positive for susceptible to infections.  ?Neurological:  Positive for numbness and weakness.  ?Hematological:  Negative for bruising/bleeding tendency.  ?Psychiatric/Behavioral:  Positive for sleep disturbance.   ? ?PMFS History:  ?Patient Active Problem List  ? Diagnosis Date Noted  ? History of juvenile rheumatoid arthritis 02/18/2022  ? Swelling of toe of right foot 02/18/2022  ? Bilateral foot pain 02/18/2022  ? Vitamin D deficiency 05/21/2021  ?  Encounter for surveillance of vaginal ring hormonal contraceptive device 02/14/2020  ? Anxiety and depression 11/10/2018  ? PTSD (post-traumatic stress disorder) 11/10/2018  ? Depression, recurrent ([place]) 01/15/2017  ?  ?Past Medical History:  ?Diagnosis Date  ? Preeclampsia   ? Rheumatoid arthritis ([place])   ?  ?Family History  ?Problem Relation Age of Onset  ? Diabetes Mother   ? Asthma Mother    ? Neuropathy Mother   ? Kidney disease Mother   ? Asthma Sister   ? Rheum arthritis Maternal Grandmother   ? ?Past Surgical History:  ?Procedure Laterality Date  ? CESAREAN SECTION    ? ?Social History  ? ?Social History Narrative  ? Not on file  ? ?Immunization History  ?Administered Date(s) Administered  ? Influenza,inj,quad, With Preservative 08/07/2019  ? Influenza-Unspecified 08/12/2017  ? PFIZER(Purple Top)SARS-COV-2 Vaccination 12/26/2019, 01/16/2020, 10/31/2020  ? Tdap 02/18/2018  ?  ? ?Objective: ?Vital Signs: BP 124/85 (BP Location: Left Arm, Patient Position: Sitting, Cuff Size: Large)   Pulse (!) 102   Resp 14   Ht '5\' 6"'$  (1.676 m)   Wt 262 lb (118.8 kg)   BMI 42.29 kg/m?   ? ?Physical Exam ?Constitutional:   ?   Appearance: She is obese.  ?HENT:  ?   Mouth/Throat:  ?   Mouth: Mucous membranes are moist.  ?   Pharynx: Oropharynx is clear.  ?Eyes:  ?   Conjunctiva/sclera: Conjunctivae normal.  ?Cardiovascular:  ?   Rate and Rhythm: Regular rhythm. Tachycardia present.  ?Pulmonary:  ?   Effort: Pulmonary effort is normal.  ?   Breath sounds: Normal breath sounds.  ?Musculoskeletal:  ?   Right lower leg: No edema.  ?   Left lower leg: No edema.  ?Skin: ?   General: Skin is warm and dry.  ?   Comments: Blanching facial erythema no macules papules or telangiectasias ?No nail ridges or pitting  ?Neurological:  ?   Mental Status: She is alert.  ?Psychiatric:     ?   Mood and Affect: Mood normal.  ?  ? ?Musculoskeletal Exam:  ?Shoulders full ROM no tenderness or swelling ?Elbows full ROM no tenderness or swelling ?Wrists full ROM no tenderness or swelling ?Fingers full ROM no tenderness or swelling ?Knees full ROM no tenderness or swelling ?Ankles full ROM no tenderness or swelling ?Tenderness to pressure on the bottom of both feet under the midfoot arch without palpable nodule or swelling ?Right 2nd toe swelling at base of the toe, minimal tenderness, no redness or warmth ? ? ?Investigation: ?No additional  findings. ? ?Imaging: ?No results found. ? ?Recent Labs: ?Lab Results  ?Component Value Date  ? WBC 9.3 10/06/2021  ? HGB 13.5 10/06/2021  ? PLT 329 10/06/2021  ? NA 134 (L) 10/06/2021  ? K 4.2 10/06/2021  ? CL 110 10/06/2021  ? CO2 15 (L) 10/06/2021  ? GLUCOSE 137 (H) 10/06/2021  ? BUN 7 10/06/2021  ? CREATININE 0.61 10/06/2021  ? BILITOT 0.1 (L) 10/06/2021  ? ALKPHOS 82 10/06/2021  ? AST 17 10/06/2021  ? ALT 18 10/06/2021  ? PROT 6.9 10/06/2021  ? ALBUMIN 3.4 (L) 10/06/2021  ? CALCIUM 8.7 (L) 10/06/2021  ? GFRAA 140 02/14/2020  ? ? ?Speciality Comments: No specialty comments available. ? ?Procedures:  ?No procedures performed ?Allergies: Penicillins  ? ?Assessment / Plan:     ?Visit Diagnoses: Swelling of toe of right foot - Plan: Sedimentation rate, Cyclic citrul peptide antibody, IgG, ANA ? ?Right second  toe is visibly swollen not particularly painful her symptoms are actually worse in many other areas.  We will check additional serum markers for inflammation with sed rate also checking CCP antibody and ANA test.  If abnormal findings would consider trial of treatment such as sulfasalazine.  Would plan to follow-up consider repeat ultrasound examination if toe remains equally swollen with unclear cause. ? ?History of juvenile rheumatoid arthritis ? ?Unable to completely confirm this or what type/pattern of JRA was involved.  Working up current arthritis as above. ? ?Bilateral foot pain ? ?Worst pain is actually on the arch of the feet I do not appreciate any fibromas or significant deformity. ? ?Orders: ?Orders Placed This Encounter  ?Procedures  ? Sedimentation rate  ? Cyclic citrul peptide antibody, IgG  ? ANA  ? ?No orders of the defined types were placed in this encounter. ? ? ? ?Follow-Up Instructions: Return in about 2 weeks (around 03/04/2022) for Joint pains/toe swelling/?JRA Hx. ? ? ?Collier Salina, MD ? ?Note - This record has been created using [employer].  ?Chart creation errors have been  sought, but may not always  ?have been located. Such creation errors do not reflect on  ?the standard of medical care. ? ?

## 2022-02-17 ENCOUNTER — Encounter: Payer: Self-pay | Admitting: Physical Therapy

## 2022-02-17 ENCOUNTER — Ambulatory Visit: Payer: BC Managed Care – PPO | Admitting: Physical Therapy

## 2022-02-17 DIAGNOSIS — M79672 Pain in left foot: Secondary | ICD-10-CM

## 2022-02-17 DIAGNOSIS — M7661 Achilles tendinitis, right leg: Secondary | ICD-10-CM | POA: Diagnosis not present

## 2022-02-17 DIAGNOSIS — M7662 Achilles tendinitis, left leg: Secondary | ICD-10-CM | POA: Diagnosis not present

## 2022-02-17 DIAGNOSIS — M79671 Pain in right foot: Secondary | ICD-10-CM

## 2022-02-17 NOTE — Therapy (Signed)
[place] ?[facility] ?[place] ?[place], [place], [address] ?Phone: [phone]   Fax:  [phone] ? ?Physical Therapy Treatment ? ?Patient Details  ?Name: Kelly Campbell ?MRN: [id number] ?Date of Birth: 12-01-1990 ?Referring Provider (PT): Jacqualyn Posey, [place] ? ? ?Encounter Date: 02/17/2022 ? ? PT End of Session - 02/17/22 1706   ? ? Visit Number 2   ? Number of Visits 10   ? Date for PT Re-Evaluation 05/08/22   ? PT Start Time 0355   ? PT Stop Time 0442   ? PT Time Calculation (min) 47 min   ? Activity Tolerance Patient tolerated treatment well   ? Behavior During Therapy [facility] for tasks assessed/performed   ? ?  ?  ? ?  ? ? ?Past Medical History:  ?Diagnosis Date  ? Preeclampsia   ? ? ?Past Surgical History:  ?Procedure Laterality Date  ? CESAREAN SECTION    ? ? ?There were no vitals filed for this visit. ? ? Subjective Assessment - 02/17/22 1657   ? ? Subjective Patient c/o of bilateral arch pain.  Going to see a Rheumatologist.   ? Pertinent History chronic back and bilateral knee pain   ? Limitations Standing;Walking   ? Patient Stated Goals reduced pain, be able to stand and walk longer   ? Currently in Pain? Yes   ? Pain Score 5    ? Pain Location Foot   ? Pain Orientation Right;Left   ? Pain Descriptors / Indicators Throbbing;Aching   ? Pain Onset More than a month ago   ? ?  ?  ? ?  ? ? ? ? ? ? ? ? ? ? ? ? ? ? ? ? ? ? ? ? [place] Adult PT Treatment/Exercise - 02/17/22 0001   ? ?  ? Modalities  ? Modalities Electrical Stimulation;Vasopneumatic;Ultrasound   ?  ? Electrical Stimulation  ? Electrical Stimulation Location Right foot (plantar surface).   ? Electrical Stimulation Action Low-level Pre-mod.   ? Electrical Stimulation Parameters 80-150 Hz x 15 minutes.   ? Electrical Stimulation Goals Pain   ?  ? Ultrasound  ? Ultrasound Location Bilateral plantar surface of feet.   ? Ultrasound Parameters Small soundhead U/S at 1.50 W/CM2 x 7 minutes each foot (14 minutes total).   ?  ?  Vasopneumatic  ? Number Minutes Vasopneumatic  15 minutes   ? Vasopnuematic Location  --   Right foot.  ? Vasopneumatic Pressure Low   ?  ? Manual Therapy  ? Manual Therapy Soft tissue mobilization   ? Soft tissue mobilization STW/M x 5 minutes each plantar fascia (10 minutes total).   ? ?  ?  ? ?  ? ? ? ? ? ? ? ? ? ? PT Education - 02/17/22 1658   ? ? Education Details Instruct in standing calf stretch and plantar fascia stretching.   ? Person(s) Educated Patient   ? Methods Explanation   ? Comprehension Verbalized understanding   ? ?  ?  ? ?  ? ? ? ? ? ? PT Long Term Goals - 02/10/22 1904   ? ?  ? PT LONG TERM GOAL #1  ? Title Patient will be independent with her HEP.   ? Time 5   ? Period Weeks   ? Status New   ? Target Date 03/17/22   ?  ? PT LONG TERM GOAL #2  ? Title Patient will be able to complete her critical job demands without her  familiar pain exceeding a 4/10.   ? Time 5   ? Period Weeks   ? Status New   ? Target Date 03/17/22   ?  ? PT LONG TERM GOAL #3  ? Title Patient will be able to squat and lift at least 15 pounds without being limited by her familiar foot pain.   ? Time 5   ? Period Weeks   ? Status New   ? Target Date 03/17/22   ? ?  ?  ? ?  ? ? ? ? ? ? ? ? Plan - 02/17/22 1704   ? ? Clinical Impression Statement Patient did well with treatment today and felt better following.  We discussed calf and plantar fascia stretching as well as self-massage to her plantar fascia.   ? Personal Factors and Comorbidities Time since onset of injury/illness/exacerbation;Other   ? Examination-Activity Limitations Caring for Others;Locomotion Level;Bend;Carry;Squat;Stairs;Stand;Lift   ? Examination-Participation Restrictions Occupation;Cleaning   ? Stability/Clinical Decision Making Evolving/Moderate complexity   ? Rehab Potential Good   ? PT Frequency 2x / week   ? PT Duration --   5 weeks.  ? PT Treatment/Interventions ADLs/Self Care Home Management;Cryotherapy;Electrical Stimulation;Ultrasound;Moist  Heat;Functional mobility training;Therapeutic activities;Therapeutic exercise;Neuromuscular re-education;Manual techniques;Patient/family education;Passive range of motion;Dry needling;Vasopneumatic Device;Taping   ? PT Next Visit Plan nustep, manual therapy, foot intrensic strengthening, and modalities as needed   ? Consulted and Agree with Plan of Care Patient   ? ?  ?  ? ?  ? ? ?Patient will benefit from skilled therapeutic intervention in order to improve the following deficits and impairments:  Difficulty walking, Decreased activity tolerance, Pain, Decreased strength, Decreased mobility ? ?Visit Diagnosis: ?Pain in left foot ? ?Pain in right foot ? ? ? ? ?Problem List ?Patient Active Problem List  ? Diagnosis Date Noted  ? Vitamin D deficiency 05/21/2021  ? Encounter for surveillance of vaginal ring hormonal contraceptive device 02/14/2020  ? Anxiety and depression 11/10/2018  ? PTSD (post-traumatic stress disorder) 11/10/2018  ? Depression, recurrent ([place]) 01/15/2017  ? ? ?Kelly Campbell, [place], PT ?02/17/2022, 5:08 PM ? ?[place] ?[facility] ?[place] ?[place], [place], [address] ?Phone: [phone]   Fax:  [phone] ? ?Name: Kelly Campbell ?MRN: [id number] ?Date of Birth: 1991-02-15 ? ? ? ?

## 2022-02-18 ENCOUNTER — Encounter: Payer: Self-pay | Admitting: Internal Medicine

## 2022-02-18 ENCOUNTER — Ambulatory Visit: Payer: BC Managed Care – PPO | Admitting: Internal Medicine

## 2022-02-18 VITALS — BP 124/85 | HR 102 | Resp 14 | Ht 66.0 in | Wt 262.0 lb

## 2022-02-18 DIAGNOSIS — M7989 Other specified soft tissue disorders: Secondary | ICD-10-CM | POA: Diagnosis not present

## 2022-02-18 DIAGNOSIS — Z8739 Personal history of other diseases of the musculoskeletal system and connective tissue: Secondary | ICD-10-CM | POA: Diagnosis not present

## 2022-02-18 DIAGNOSIS — M79672 Pain in left foot: Secondary | ICD-10-CM

## 2022-02-18 DIAGNOSIS — M79671 Pain in right foot: Secondary | ICD-10-CM | POA: Diagnosis not present

## 2022-02-19 ENCOUNTER — Ambulatory Visit: Payer: BC Managed Care – PPO | Admitting: Physical Therapy

## 2022-02-19 DIAGNOSIS — M79671 Pain in right foot: Secondary | ICD-10-CM | POA: Diagnosis not present

## 2022-02-19 DIAGNOSIS — M79672 Pain in left foot: Secondary | ICD-10-CM | POA: Diagnosis not present

## 2022-02-19 DIAGNOSIS — M7662 Achilles tendinitis, left leg: Secondary | ICD-10-CM | POA: Diagnosis not present

## 2022-02-19 DIAGNOSIS — M7661 Achilles tendinitis, right leg: Secondary | ICD-10-CM | POA: Diagnosis not present

## 2022-02-19 NOTE — Therapy (Signed)
[place] ?[facility] ?[place] ?[place], [place], [address] ?Phone: [phone]   Fax:  [phone] ? ?Physical Therapy Treatment ? ?Patient Details  ?Name: Kelly Campbell ?MRN: [id number] ?Date of Birth: 1991/07/04 ?Referring Provider (PT): Jacqualyn Posey, [place] ? ? ?Encounter Date: 02/19/2022 ? ? PT End of Session - 02/19/22 1713   ? ? Visit Number 3   ? Number of Visits 10   ? Date for PT Re-Evaluation 05/08/22   ? PT Start Time 0350   ? PT Stop Time 5916   ? PT Time Calculation (min) 50 min   ? Activity Tolerance Patient tolerated treatment well   ? Behavior During Therapy [facility] for tasks assessed/performed   ? ?  ?  ? ?  ? ? ?Past Medical History:  ?Diagnosis Date  ? Preeclampsia   ? Rheumatoid arthritis ([place])   ? ? ?Past Surgical History:  ?Procedure Laterality Date  ? CESAREAN SECTION    ? ? ?There were no vitals filed for this visit. ? ? Subjective Assessment - 02/19/22 1636   ? ? Subjective Feet felt real good after last treatment.  Saw Rheumatologist.  Pain about a 7 today.   ? Pertinent History chronic back and bilateral knee pain   ? Limitations Standing;Walking   ? Patient Stated Goals reduced pain, be able to stand and walk longer   ? Currently in Pain? Yes   ? Pain Score 7    ? Pain Location Foot   ? Pain Orientation Right;Left   ? Pain Descriptors / Indicators Aching;Throbbing   ? Pain Type Chronic pain   ? Pain Onset More than a month ago   ? ?  ?  ? ?  ? ? ? ? ? ? ? ? ? ? ? ? ? ? ? ? ? ? ? ? [place] Adult PT Treatment/Exercise - 02/19/22 0001   ? ?  ? Modalities  ? Modalities Electrical Stimulation;Vasopneumatic   ?  ? Electrical Stimulation  ? Electrical Stimulation Location RT foot (plantar surface)   ? Electrical Stimulation Action Low-level pre-mod   ? Electrical Stimulation Parameters 80-150 Hz x 15 minutes.   ? Electrical Stimulation Goals Pain   ?  ? Ultrasound  ? Ultrasound Location Bil planatar surface of foot.   ? Ultrasound Parameters Small soundhead 3.3 mHz at 50%  x 7 minutes each (14 minutes total)   ?  ? Vasopneumatic  ? Number Minutes Vasopneumatic  15 minutes   ? Vasopnuematic Location  --   Right foot.  ?  ? Manual Therapy  ? Manual Therapy Soft tissue mobilization   ? Soft tissue mobilization Gentle STW/M x 5 minutes each foot (arches), total 10 minutes.   ? ?  ?  ? ?  ? ? ? ? ? ? ? ? ? ? ? ? ? ? ? PT Long Term Goals - 02/10/22 1904   ? ?  ? PT LONG TERM GOAL #1  ? Title Patient will be independent with her HEP.   ? Time 5   ? Period Weeks   ? Status New   ? Target Date 03/17/22   ?  ? PT LONG TERM GOAL #2  ? Title Patient will be able to complete her critical job demands without her familiar pain exceeding a 4/10.   ? Time 5   ? Period Weeks   ? Status New   ? Target Date 03/17/22   ?  ? PT LONG TERM GOAL #3  ?  Title Patient will be able to squat and lift at least 15 pounds without being limited by her familiar foot pain.   ? Time 5   ? Period Weeks   ? Status New   ? Target Date 03/17/22   ? ?  ?  ? ?  ? ? ? ? ? ? ? ? Plan - 02/19/22 1658   ? ? Clinical Impression Statement Patient to clinic with a higher pain-level.  She did well with gentle soft tissue work.  She has also been the stretches.  She left feeling better with decreased pain.   ? Personal Factors and Comorbidities Time since onset of injury/illness/exacerbation;Other   ? Examination-Activity Limitations Caring for Others;Locomotion Level;Bend;Carry;Squat;Stairs;Stand;Lift   ? Examination-Participation Restrictions Occupation;Cleaning   ? Stability/Clinical Decision Making Evolving/Moderate complexity   ? Rehab Potential Good   ? PT Frequency 2x / week   ? PT Treatment/Interventions ADLs/Self Care Home Management;Cryotherapy;Electrical Stimulation;Ultrasound;Moist Heat;Functional mobility training;Therapeutic activities;Therapeutic exercise;Neuromuscular re-education;Manual techniques;Patient/family education;Passive range of motion;Dry needling;Vasopneumatic Device;Taping   ? PT Next Visit Plan nustep,  manual therapy, foot intrensic strengthening, and modalities as needed   ? Consulted and Agree with Plan of Care Patient   ? ?  ?  ? ?  ? ? ?Patient will benefit from skilled therapeutic intervention in order to improve the following deficits and impairments:  Difficulty walking, Decreased activity tolerance, Pain, Decreased strength, Decreased mobility ? ?Visit Diagnosis: ?Pain in left foot ? ?Pain in right foot ? ? ? ? ?Problem List ?Patient Active Problem List  ? Diagnosis Date Noted  ? History of juvenile rheumatoid arthritis 02/18/2022  ? Swelling of toe of right foot 02/18/2022  ? Bilateral foot pain 02/18/2022  ? Vitamin D deficiency 05/21/2021  ? Encounter for surveillance of vaginal ring hormonal contraceptive device 02/14/2020  ? Anxiety and depression 11/10/2018  ? PTSD (post-traumatic stress disorder) 11/10/2018  ? Depression, recurrent ([place]) 01/15/2017  ? ? ?Delio Slates, [place], PT ?02/19/2022, 5:14 PM ? ?[place] ?[facility] ?[place] ?[place], [place], [address] ?Phone: [phone]   Fax:  [phone] ? ?Name: Kelly Campbell ?MRN: [id number] ?Date of Birth: 11-25-1990 ? ? ? ?

## 2022-02-20 LAB — ANA: Anti Nuclear Antibody (ANA): NEGATIVE

## 2022-02-20 LAB — CYCLIC CITRUL PEPTIDE ANTIBODY, IGG: Cyclic Citrullin Peptide Ab: <16 UNITS

## 2022-02-20 LAB — SEDIMENTATION RATE: Sed Rate: 9 mm/h (ref 0–20)

## 2022-02-24 ENCOUNTER — Ambulatory Visit: Payer: BC Managed Care – PPO | Admitting: Physical Therapy

## 2022-02-24 DIAGNOSIS — M79672 Pain in left foot: Secondary | ICD-10-CM

## 2022-02-24 DIAGNOSIS — M7662 Achilles tendinitis, left leg: Secondary | ICD-10-CM | POA: Diagnosis not present

## 2022-02-24 DIAGNOSIS — M79671 Pain in right foot: Secondary | ICD-10-CM

## 2022-02-24 DIAGNOSIS — M7661 Achilles tendinitis, right leg: Secondary | ICD-10-CM | POA: Diagnosis not present

## 2022-02-24 NOTE — Therapy (Signed)
[place] ?[facility] ?[place] ?[place], [place], [address] ?Phone: [phone]   Fax:  [phone] ? ?Physical Therapy Treatment ? ?Patient Details  ?Name: Kelly Campbell ?MRN: [id number] ?Date of Birth: 1991-02-02 ?Referring Provider (PT): Jacqualyn Posey, [place] ? ? ?Encounter Date: 02/24/2022 ? ? PT End of Session - 02/24/22 1638   ? ? Visit Number 4   ? Number of Visits 10   ? Date for PT Re-Evaluation 05/08/22   ? PT Start Time 0400   ? PT Stop Time 4268   ? PT Time Calculation (min) 50 min   ? Activity Tolerance Patient tolerated treatment well   ? Behavior During Therapy [facility] for tasks assessed/performed   ? ?  ?  ? ?  ? ? ?Past Medical History:  ?Diagnosis Date  ? Preeclampsia   ? Rheumatoid arthritis ([place])   ? ? ?Past Surgical History:  ?Procedure Laterality Date  ? CESAREAN SECTION    ? ? ?There were no vitals filed for this visit. ? ? Subjective Assessment - 02/24/22 1638   ? ? Subjective Feet feeling good.  Didn't have to work today.  Been having some left knee pain.   ? Pertinent History chronic back and bilateral knee pain   ? Limitations Standing;Walking   ? Patient Stated Goals reduced pain, be able to stand and walk longer   ? Currently in Pain? Yes   ? Pain Score 3    ? Pain Location Foot   ? Pain Orientation Right;Left   ? Pain Descriptors / Indicators Aching   ? Pain Type Chronic pain   ? Pain Onset More than a month ago   ? ?  ?  ? ?  ? ? ? ? ? ? ? ? ? ? ? ? ? ? ? ? ? ? ? ? [place] Adult PT Treatment/Exercise - 02/24/22 0001   ? ?  ? Modalities  ? Modalities Electrical Stimulation;Vasopneumatic   ?  ? Electrical Stimulation  ? Electrical Stimulation Location RT foot(plantar surface).   ? Electrical Stimulation Action Low-level pre-mod.   ? Electrical Stimulation Parameters 80-150 Hz x 15 minutes.   ? Electrical Stimulation Goals Pain   ?  ? Ultrasound  ? Ultrasound Location Bil plantar surface of feet   ? Ultrasound Parameters Small soundhead:  U/S at 1.50 W/CM2 at 50% at  3.3 mHz x 16 minutes total (8 minutes each foot).   ?  ? Vasopneumatic  ? Number Minutes Vasopneumatic  15 minutes   ? Vasopnuematic Location  --   RT foot.  ? Vasopneumatic Pressure Low   ?  ? Manual Therapy  ? Manual Therapy Soft tissue mobilization   ? Soft tissue mobilization Gentle STW/M x 10 minutes (5 minutes each foot plantar surface.   ? ?  ?  ? ?  ? ? ? ? ? ? ? ? ? ? ? ? ? ? ? PT Long Term Goals - 02/10/22 1904   ? ?  ? PT LONG TERM GOAL #1  ? Title Patient will be independent with her HEP.   ? Time 5   ? Period Weeks   ? Status New   ? Target Date 03/17/22   ?  ? PT LONG TERM GOAL #2  ? Title Patient will be able to complete her critical job demands without her familiar pain exceeding a 4/10.   ? Time 5   ? Period Weeks   ? Status New   ? Target Date  03/17/22   ?  ? PT LONG TERM GOAL #3  ? Title Patient will be able to squat and lift at least 15 pounds without being limited by her familiar foot pain.   ? Time 5   ? Period Weeks   ? Status New   ? Target Date 03/17/22   ? ?  ?  ? ?  ? ? ? ? ? ? ? ? Plan - 02/24/22 1642   ? ? Clinical Impression Statement Patient with lowered bilateral foot pain today though she did not have to work.  She tolerated treatment without complaint and feel tretament are helping.   ? Personal Factors and Comorbidities Time since onset of injury/illness/exacerbation;Other   ? Examination-Activity Limitations Caring for Others;Locomotion Level;Bend;Carry;Squat;Stairs;Stand;Lift   ? Examination-Participation Restrictions Occupation;Cleaning   ? Stability/Clinical Decision Making Evolving/Moderate complexity   ? Rehab Potential Good   ? PT Frequency 2x / week   ? PT Duration --   5 weeks.  ? PT Treatment/Interventions ADLs/Self Care Home Management;Cryotherapy;Electrical Stimulation;Ultrasound;Moist Heat;Functional mobility training;Therapeutic activities;Therapeutic exercise;Neuromuscular re-education;Manual techniques;Patient/family education;Passive range of motion;Dry  needling;Vasopneumatic Device;Taping   ? PT Next Visit Plan nustep, manual therapy, foot intrensic strengthening, and modalities as needed   ? Consulted and Agree with Plan of Care Patient   ? ?  ?  ? ?  ? ? ?Patient will benefit from skilled therapeutic intervention in order to improve the following deficits and impairments:  Difficulty walking, Decreased activity tolerance, Pain, Decreased strength, Decreased mobility ? ?Visit Diagnosis: ?Pain in left foot ? ?Pain in right foot ? ? ? ? ?Problem List ?Patient Active Problem List  ? Diagnosis Date Noted  ? History of juvenile rheumatoid arthritis 02/18/2022  ? Swelling of toe of right foot 02/18/2022  ? Bilateral foot pain 02/18/2022  ? Vitamin D deficiency 05/21/2021  ? Encounter for surveillance of vaginal ring hormonal contraceptive device 02/14/2020  ? Anxiety and depression 11/10/2018  ? PTSD (post-traumatic stress disorder) 11/10/2018  ? Depression, recurrent ([place]) 01/15/2017  ? ? ?Purvis Sidle, [place], PT ?02/24/2022, 5:04 PM ? ?[place] ?[facility] ?[place] ?[place], [place], [address] ?Phone: [phone]   Fax:  [phone] ? ?Name: Kelly Campbell ?MRN: [id number] ?Date of Birth: [birth year] ? ? ? ?

## 2022-02-24 NOTE — Progress Notes (Signed)
Office Visit Note  Patient: Kelly Campbell             Date of Birth: [birth year]           MRN: [id number]             PCP: Dettinger, Fransisca Kaufmann, MD Referring: Dettinger, Fransisca Kaufmann, MD Visit Date: 03/10/2022   Subjective:  Follow-up (Doing good)   History of Present Illness: Kelly Campbell is a 31 y.o. female here for follow up for RA with multiple joint pains and toe swelling. Lab testing at our initial visit was negative for ANA, CCP, or sedimentation rate. She started working with PT for plantar fasciitis with partial improvement so far. No significant flare up with hand swelling. Toe remains about the same swelling as before.  Previous HPI 02/18/2022 Kelly Campbell is a 31 y.o. female here for history of RA with multiple joint pains, and more recently toe swelling seen in podiatry clinic.  She had joint pain in multiple areas ongoing for at least a year affecting hands back hips knees and feet on both sides. She has significant morning stiffness this improves somewhat during the day but has a lot of pain later in the day as well.  Sometimes associated with hand swelling and inability to tightly close her grip.  She gets swelling in her knees feels that sometimes goes more into the shin and lower leg.  She is on her feet on concrete surfaces during the workday and has increased pain in the bottoms of both feet with prolonged time standing and walking.  Problems were much worse since December of last year she was seen at the ED for severe abdominal pain work-up was pretty benign did receive some medications for pain relief.  Afterwards had new skin rashes thought likely to be allergic reaction.  Symptoms also had increased in the foot pain and stiffness and developed the swelling changes in her right second toe.  She was tried oral NSAIDs including 75 mg diclofenac with no large improvement.  Oral prednisone helped her symptoms while taking the medicine.  Xray of bilateral feet without significant  arthritis changes to explain degree of reported symptoms. She is not clear about details for the history of reported JRA.  She says she was seen at [facility] for diagnosis of their evaluation but unable to obtain records. She reports her treatment was primarily oral NSAIDs at the time does not recall being on long-term disease specific medication.  Does not have history of inflammatory eye problems. She maintains persistent redness in the face but no chronic skin rashes elsewhere.    Review of Systems  Constitutional:  Positive for fatigue.  HENT:  Negative for mouth dryness.   Eyes:  Positive for dryness.  Respiratory:  Negative for shortness of breath.   Cardiovascular:  Positive for swelling in legs/feet.  Gastrointestinal:  Negative for constipation.  Endocrine: Positive for excessive thirst.  Genitourinary:  Negative for difficulty urinating.  Musculoskeletal:  Positive for joint pain, gait problem, joint pain, joint swelling and morning stiffness.  Skin:  Negative for rash.  Allergic/Immunologic: Positive for susceptible to infections.  Neurological:  Positive for numbness and weakness.  Hematological:  Negative for bruising/bleeding tendency.  Psychiatric/Behavioral:  Positive for sleep disturbance.    PMFS History:  Patient Active Problem List   Diagnosis Date Noted   History of juvenile rheumatoid arthritis 02/18/2022   Swelling of toe of right foot 02/18/2022   Bilateral foot pain 02/18/2022  Vitamin D deficiency 05/21/2021   Encounter for surveillance of vaginal ring hormonal contraceptive device 02/14/2020   Anxiety and depression 11/10/2018   PTSD (post-traumatic stress disorder) 11/10/2018   Depression, recurrent ([place]) 01/15/2017    Past Medical History:  Diagnosis Date   Preeclampsia    Rheumatoid arthritis ([place])     Family History  Problem Relation Age of Onset   Diabetes Mother    Asthma Mother    Neuropathy Mother    Kidney disease Mother     Asthma Sister    Rheum arthritis Maternal Grandmother    Past Surgical History:  Procedure Laterality Date   CESAREAN SECTION     Social History   Social History Narrative   Not on file   Immunization History  Administered Date(s) Administered   Influenza,inj,quad, With Preservative 08/07/2019   Influenza-Unspecified 08/12/2017   PFIZER(Purple Top)SARS-COV-2 Vaccination 12/26/2019, 01/16/2020, 10/31/2020   Tdap 02/18/2018     Objective: Vital Signs: BP 137/83 (BP Location: Left Arm, Patient Position: Sitting, Cuff Size: Normal)   Pulse (!) 109   Resp 15   Ht '5\' 7"'$  (1.702 m)   Wt 263 lb (119.3 kg)   BMI 41.19 kg/m    Physical Exam Constitutional:      Appearance: She is obese.  Skin:    General: Skin is warm and dry.     Findings: No rash.  Neurological:     Mental Status: She is alert.  Psychiatric:        Mood and Affect: Mood normal.     Musculoskeletal Exam:  Neck full ROM no tenderness Shoulders full ROM no tenderness or swelling Elbows full ROM no tenderness or swelling Wrists full ROM no tenderness or swelling Fingers full ROM no tenderness or swelling Knees full ROM no tenderness or swelling Ankles full ROM no tenderness or swelling MTPs appear normal, 2nd toe with firm tender swelling between MTP and PIP on the plantar side  Limited ultrasound examination looks consistent with some edema of the subcutaneous fat or fascia no visible synovitis or tenosynovitis     Investigation: No additional findings.  Imaging: No results found.  Recent Labs: Lab Results  Component Value Date   WBC 9.3 10/06/2021   HGB 13.5 10/06/2021   PLT 329 10/06/2021   NA 134 (L) 10/06/2021   K 4.2 10/06/2021   CL 110 10/06/2021   CO2 15 (L) 10/06/2021   GLUCOSE 137 (H) 10/06/2021   BUN 7 10/06/2021   CREATININE 0.61 10/06/2021   BILITOT 0.1 (L) 10/06/2021   ALKPHOS 82 10/06/2021   AST 17 10/06/2021   ALT 18 10/06/2021   PROT 6.9 10/06/2021   ALBUMIN 3.4 (L)  10/06/2021   CALCIUM 8.7 (L) 10/06/2021   GFRAA 140 02/14/2020    Speciality Comments: No specialty comments available.  Procedures:  No procedures performed Allergies: Penicillins   Assessment / Plan:     Visit Diagnoses: Swelling of toe of right foot Bilateral foot pain  Looks most consistent with a fibroma or fibromatosis I suspect related to use or irritation at this site does not look typical for inflammatory disease change and is very localized. She has follow up scheduled with Dr. Jacqualyn Posey already I suspect this may be amenable to local steroid injection or other procedural treatment. Alternatively looks like advanced imaging considered also could be reasonable option.  History of juvenile rheumatoid arthritis - Unable to completely confirm this or what type/pattern of JRA was involved.   I do not see  clear evidence of inflammatory arthritis at this time with negative labs and no synovitis. Do not recommend additional testing or RA treatment at this time.    Orders: No orders of the defined types were placed in this encounter.  No orders of the defined types were placed in this encounter.    Follow-Up Instructions: No follow-ups on file.   Collier Salina, MD  Note - This record has been created using [employer].  Chart creation errors have been sought, but may not always  have been located. Such creation errors do not reflect on  the standard of medical care.

## 2022-02-25 ENCOUNTER — Encounter: Payer: Self-pay | Admitting: Podiatry

## 2022-02-26 ENCOUNTER — Ambulatory Visit: Payer: BC Managed Care – PPO | Admitting: *Deleted

## 2022-02-26 DIAGNOSIS — M79672 Pain in left foot: Secondary | ICD-10-CM | POA: Diagnosis not present

## 2022-02-26 DIAGNOSIS — M79671 Pain in right foot: Secondary | ICD-10-CM | POA: Diagnosis not present

## 2022-02-26 DIAGNOSIS — M7662 Achilles tendinitis, left leg: Secondary | ICD-10-CM | POA: Diagnosis not present

## 2022-02-26 DIAGNOSIS — M7661 Achilles tendinitis, right leg: Secondary | ICD-10-CM | POA: Diagnosis not present

## 2022-02-26 NOTE — Therapy (Signed)
[facility] [place], [place], [address] Phone: [phone]   Fax:  [phone]  Physical Therapy Treatment  Patient Details  Name: Kelly Campbell MRN: [id number] Date of Birth: Nov 08, 1990 Referring Provider (PT): [place], [place]   Encounter Date: 02/26/2022   PT End of Session - 02/26/22 1757     Visit Number 5    Number of Visits 10    Date for PT Re-Evaluation 05/08/22    PT Start Time 1600    PT Stop Time 1632    PT Time Calculation (min) 32 min             Past Medical History:  Diagnosis Date   Preeclampsia    Rheumatoid arthritis ([place])     Past Surgical History:  Procedure Laterality Date   CESAREAN SECTION      There were no vitals filed for this visit.   Subjective Assessment - 02/26/22 1623     Subjective Doing okay today. Still trying to figure out why RT 2nd toe is swollen. [place] was (-). Wants an MRI    Pertinent History chronic back and bilateral knee pain    Limitations Standing;Walking    Patient Stated Goals reduced pain, be able to stand and walk longer    Currently in Pain? Yes    Pain Score 3     Pain Location Foot    Pain Orientation Right;Left    Pain Descriptors / Indicators Aching    Pain Type Chronic pain    Pain Onset More than a month ago                               [facility] Adult PT Treatment/Exercise - 02/26/22 0001       Exercises   Exercises Ankle      Ankle Exercises: Seated   Other Seated Ankle Exercises Pt was instructed in short foot exs and performed x10 holding for 5-10 sec as tolerated in sitting and standing and handout given for HEP    Other Seated Ankle Exercises Pt performed plantar fascia STW using racquet ball both feet x 3-4 minutes each. Racquet ball and handout given for HEP                          PT Long Term Goals - 02/10/22 1904       PT LONG TERM GOAL #1   Title Patient will be independent with her HEP.     Time 5    Period Weeks    Status New    Target Date 03/17/22      PT LONG TERM GOAL #2   Title Patient will be able to complete her critical job demands without her familiar pain exceeding a 4/10.    Time 5    Period Weeks    Status New    Target Date 03/17/22      PT LONG TERM GOAL #3   Title Patient will be able to squat and lift at least 15 pounds without being limited by her familiar foot pain.    Time 5    Period Weeks    Status New    Target Date 03/17/22                   Plan - 02/26/22 1758     Clinical Impression Statement Pt arrived today doing fair, but reports  unable to find out whats going on with RT 2nd toe swelling. Dr wants to do an MRI. Rx focused on exs for strengthening Bil foot intrinsics with short foot exs f/ STW to plantar fascia using raquet ball. Pt denied any modalities today and was given a raquet ball and handout for HEP.    Personal Factors and Comorbidities Time since onset of injury/illness/exacerbation;Other    Examination-Activity Limitations Caring for Others;Locomotion Level;Bend;Carry;Squat;Stairs;Stand;Lift    Examination-Participation Restrictions Occupation;Cleaning    Stability/Clinical Decision Making Evolving/Moderate complexity    Rehab Potential Good    PT Treatment/Interventions ADLs/Self Care Home Management;Cryotherapy;Electrical Stimulation;Ultrasound;Moist Heat;Functional mobility training;Therapeutic activities;Therapeutic exercise;Neuromuscular re-education;Manual techniques;Patient/family education;Passive range of motion;Dry needling;Vasopneumatic Device;Taping    PT Next Visit Plan nustep, manual therapy, foot intrensic strengthening, and modalities as needed             Patient will benefit from skilled therapeutic intervention in order to improve the following deficits and impairments:  Difficulty walking, Decreased activity tolerance, Pain, Decreased strength, Decreased mobility  Visit Diagnosis: Pain in left  foot  Pain in right foot     Problem List Patient Active Problem List   Diagnosis Date Noted   History of juvenile rheumatoid arthritis 02/18/2022   Swelling of toe of right foot 02/18/2022   Bilateral foot pain 02/18/2022   Vitamin D deficiency 05/21/2021   Encounter for surveillance of vaginal ring hormonal contraceptive device 02/14/2020   Anxiety and depression 11/10/2018   PTSD (post-traumatic stress disorder) 11/10/2018   Depression, recurrent ([place]) 01/15/2017    Tiamarie Furnari,CHRIS, PTA 02/26/2022, 6:07 PM  [facility] [address] [place], [place], [address] Phone: [phone]   Fax:  [phone]  Name: Kelly Campbell MRN: [id number] Date of Birth: 26-Dec-1990

## 2022-03-03 ENCOUNTER — Ambulatory Visit: Payer: BC Managed Care – PPO | Admitting: *Deleted

## 2022-03-03 DIAGNOSIS — M7662 Achilles tendinitis, left leg: Secondary | ICD-10-CM | POA: Diagnosis not present

## 2022-03-03 DIAGNOSIS — M79671 Pain in right foot: Secondary | ICD-10-CM

## 2022-03-03 DIAGNOSIS — M79672 Pain in left foot: Secondary | ICD-10-CM

## 2022-03-03 DIAGNOSIS — M7661 Achilles tendinitis, right leg: Secondary | ICD-10-CM | POA: Diagnosis not present

## 2022-03-03 NOTE — Therapy (Signed)
[facility] [place], [place], [address] Phone: [phone]   Fax:  [phone]  Physical Therapy Treatment  Patient Details  Name: Kelly Campbell MRN: [id number] Date of Birth: Dec 29, 1990 Referring Provider (PT): [place], [place]   Encounter Date: 03/03/2022   PT End of Session - 03/03/22 1735     Visit Number 6    Number of Visits 10    Date for PT Re-Evaluation 05/08/22    PT Start Time 1600    PT Stop Time 1636    PT Time Calculation (min) 36 min             Past Medical History:  Diagnosis Date   Preeclampsia    Rheumatoid arthritis ([place])     Past Surgical History:  Procedure Laterality Date   CESAREAN SECTION      There were no vitals filed for this visit.   Subjective Assessment - 03/03/22 1730     Subjective Decreased pain in both feet 2/10. Have not been up on them  a lot this week.    Pertinent History chronic back and bilateral knee pain    Limitations Standing;Walking    Patient Stated Goals reduced pain, be able to stand and walk longer    Currently in Pain? Yes    Pain Score 2     Pain Location Foot    Pain Orientation Right;Left    Pain Descriptors / Indicators Sore    Pain Type Chronic pain                               [facility] Adult PT Treatment/Exercise - 03/03/22 0001       Exercises   Exercises Ankle      Manual Therapy   Manual Therapy Soft tissue mobilization    Soft tissue mobilization IASTM/ STW/M x16 minutes  to bil calves with Pt lying prone f/b 2 mins of calf stretching on rocker board      Ankle Exercises: Aerobic   Nustep L3 x 8 mins f/b 5 mins of DF PF and calf stretching on rocker board                          PT Long Term Goals - 02/10/22 1904       PT LONG TERM GOAL #1   Title Patient will be independent with her HEP.    Time 5    Period Weeks    Status New    Target Date 03/17/22      PT LONG TERM GOAL #2   Title  Patient will be able to complete her critical job demands without her familiar pain exceeding a 4/10.    Time 5    Period Weeks    Status New    Target Date 03/17/22      PT LONG TERM GOAL #3   Title Patient will be able to squat and lift at least 15 pounds without being limited by her familiar foot pain.    Time 5    Period Weeks    Status New    Target Date 03/17/22                   Plan - 03/03/22 1736     Clinical Impression Statement Pt arrived today doing some better with decreased bil. foot pain. Rx focused on ankle ROM, calf stretching and  STW / IASTM to bil. calves. Pt felt good end of session and denied any other modalities.    Personal Factors and Comorbidities Time since onset of injury/illness/exacerbation;Other    Examination-Activity Limitations Caring for Others;Locomotion Level;Bend;Carry;Squat;Stairs;Stand;Lift    Examination-Participation Restrictions Occupation;Cleaning    Stability/Clinical Decision Making Evolving/Moderate complexity    Rehab Potential Good    PT Frequency 2x / week    PT Treatment/Interventions ADLs/Self Care Home Management;Cryotherapy;Electrical Stimulation;Ultrasound;Moist Heat;Functional mobility training;Therapeutic activities;Therapeutic exercise;Neuromuscular re-education;Manual techniques;Patient/family education;Passive range of motion;Dry needling;Vasopneumatic Device;Taping    PT Next Visit Plan nustep, manual therapy, foot intrensic strengthening, and modalities as needed    Consulted and Agree with Plan of Care Patient             Patient will benefit from skilled therapeutic intervention in order to improve the following deficits and impairments:  Difficulty walking, Decreased activity tolerance, Pain, Decreased strength, Decreased mobility  Visit Diagnosis: Pain in left foot  Pain in right foot     Problem List Patient Active Problem List   Diagnosis Date Noted   History of juvenile rheumatoid arthritis  02/18/2022   Swelling of toe of right foot 02/18/2022   Bilateral foot pain 02/18/2022   Vitamin D deficiency 05/21/2021   Encounter for surveillance of vaginal ring hormonal contraceptive device 02/14/2020   Anxiety and depression 11/10/2018   PTSD (post-traumatic stress disorder) 11/10/2018   Depression, recurrent ([place]) 01/15/2017    Johnchristopher Sarvis,CHRIS, PTA 03/03/2022, 5:39 PM  [facility] [address] [place], [place], [address] Phone: [phone]   Fax:  [phone]  Name: Trivia Heffelfinger MRN: [id number] Date of Birth: September 13, 1991

## 2022-03-05 ENCOUNTER — Encounter: Payer: BC Managed Care – PPO | Admitting: *Deleted

## 2022-03-10 ENCOUNTER — Encounter: Payer: Self-pay | Admitting: Internal Medicine

## 2022-03-10 ENCOUNTER — Ambulatory Visit: Payer: BC Managed Care – PPO | Admitting: Internal Medicine

## 2022-03-10 ENCOUNTER — Ambulatory Visit: Payer: BC Managed Care – PPO | Admitting: Physical Therapy

## 2022-03-10 VITALS — BP 137/83 | HR 109 | Resp 15 | Ht 67.0 in | Wt 263.0 lb

## 2022-03-10 DIAGNOSIS — M79672 Pain in left foot: Secondary | ICD-10-CM | POA: Diagnosis not present

## 2022-03-10 DIAGNOSIS — M79671 Pain in right foot: Secondary | ICD-10-CM | POA: Diagnosis not present

## 2022-03-10 DIAGNOSIS — M7662 Achilles tendinitis, left leg: Secondary | ICD-10-CM | POA: Diagnosis not present

## 2022-03-10 DIAGNOSIS — M7989 Other specified soft tissue disorders: Secondary | ICD-10-CM

## 2022-03-10 DIAGNOSIS — Z8739 Personal history of other diseases of the musculoskeletal system and connective tissue: Secondary | ICD-10-CM | POA: Diagnosis not present

## 2022-03-10 DIAGNOSIS — M7661 Achilles tendinitis, right leg: Secondary | ICD-10-CM | POA: Diagnosis not present

## 2022-03-10 NOTE — Therapy (Signed)
[facility] [place], [place], [address] Phone: [phone]   Fax:  [phone]  Physical Therapy Treatment  Patient Details  Name: Kelly Campbell MRN: [id number] Date of Birth: Mar 30, 1991 Referring Provider (PT): [place], [place]   Encounter Date: 03/10/2022   PT End of Session - 03/10/22 1703     Visit Number 7    Number of Visits 10    Date for PT Re-Evaluation 05/08/22    PT Start Time 0400    PT Stop Time 0441    PT Time Calculation (min) 41 min    Activity Tolerance Patient tolerated treatment well    Behavior During Therapy [facility] for tasks assessed/performed             Past Medical History:  Diagnosis Date   Preeclampsia    Rheumatoid arthritis ([place])     Past Surgical History:  Procedure Laterality Date   CESAREAN SECTION      There were no vitals filed for this visit.   Subjective Assessment - 03/10/22 1704     Subjective Feet pain at a 2.    Pertinent History chronic back and bilateral knee pain    Limitations Standing;Walking    Patient Stated Goals reduced pain, be able to stand and walk longer    Currently in Pain? Yes    Pain Score 2     Pain Location Foot    Pain Orientation Right;Left    Pain Descriptors / Indicators Sore    Pain Type Chronic pain    Pain Onset More than a month ago                               [facility] Adult PT Treatment/Exercise - 03/10/22 0001       Exercises   Exercises Ankle      Manual Therapy   Manual Therapy Soft tissue mobilization    Soft tissue mobilization STW/M x 9 minutes each plantar surface of feet (18 minutes total).      Ankle Exercises: Aerobic   Nustep Level 3 x 10 minutes.      Ankle Exercises: Standing   Rocker Board 3 minutes                          PT Long Term Goals - 02/10/22 1904       PT LONG TERM GOAL #1   Title Patient will be independent with her HEP.    Time 5    Period Weeks    Status  New    Target Date 03/17/22      PT LONG TERM GOAL #2   Title Patient will be able to complete her critical job demands without her familiar pain exceeding a 4/10.    Time 5    Period Weeks    Status New    Target Date 03/17/22      PT LONG TERM GOAL #3   Title Patient will be able to squat and lift at least 15 pounds without being limited by her familiar foot pain.    Time 5    Period Weeks    Status New    Target Date 03/17/22                   Plan - 03/10/22 1706     Clinical Impression Statement Patient is responding well to treatments with bilateral  foot pain rated at a 2/10 today.  She is doing calf stretches off a step at home which is helpful.    Personal Factors and Comorbidities Time since onset of injury/illness/exacerbation;Other    Examination-Activity Limitations Caring for Others;Locomotion Level;Bend;Carry;Squat;Stairs;Stand;Lift    Examination-Participation Restrictions Occupation;Cleaning    Stability/Clinical Decision Making Evolving/Moderate complexity    Rehab Potential Good    PT Frequency 2x / week    PT Treatment/Interventions ADLs/Self Care Home Management;Cryotherapy;Electrical Stimulation;Ultrasound;Moist Heat;Functional mobility training;Therapeutic activities;Therapeutic exercise;Neuromuscular re-education;Manual techniques;Patient/family education;Passive range of motion;Dry needling;Vasopneumatic Device;Taping    PT Next Visit Plan nustep, manual therapy, foot intrensic strengthening, and modalities as needed    Consulted and Agree with Plan of Care Patient             Patient will benefit from skilled therapeutic intervention in order to improve the following deficits and impairments:  Difficulty walking, Decreased activity tolerance, Pain, Decreased strength, Decreased mobility  Visit Diagnosis: Pain in left foot  Pain in right foot     Problem List Patient Active Problem List   Diagnosis Date Noted   History of juvenile  rheumatoid arthritis 02/18/2022   Swelling of toe of right foot 02/18/2022   Bilateral foot pain 02/18/2022   Vitamin D deficiency 05/21/2021   Encounter for surveillance of vaginal ring hormonal contraceptive device 02/14/2020   Anxiety and depression 11/10/2018   PTSD (post-traumatic stress disorder) 11/10/2018   Depression, recurrent ([place]) 01/15/2017    Kyian Obst, [place], PT 03/10/2022, 5:10 PM  [facility] [address] [place], [place], [address] Phone: [phone]   Fax:  [phone]  Name: Kelly Campbell MRN: [id number] Date of Birth: [birth year]

## 2022-03-10 NOTE — Patient Instructions (Signed)
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 This is a benign swelling or growth of tissue sometimes related to pressure or repeated irritation of the affected area.  I do not see any evidence of joint or tendon swelling to suggest this is coming from RA I suspect this is more use and pressure related.

## 2022-03-12 ENCOUNTER — Ambulatory Visit: Payer: BC Managed Care – PPO | Attending: Podiatry | Admitting: Physical Therapy

## 2022-03-12 DIAGNOSIS — M79672 Pain in left foot: Secondary | ICD-10-CM | POA: Diagnosis not present

## 2022-03-12 DIAGNOSIS — M79671 Pain in right foot: Secondary | ICD-10-CM | POA: Diagnosis not present

## 2022-03-12 NOTE — Therapy (Signed)
[facility] [place], [place], [address] Phone: [phone]   Fax:  [phone]  Physical Therapy Treatment  Patient Details  Name: Tyshana Nishida MRN: [id number] Date of Birth: 05/22/1991 Referring Provider (PT): [place], [place]   Encounter Date: 03/12/2022   PT End of Session - 03/12/22 1726     Visit Number 8    Number of Visits 10    Date for PT Re-Evaluation 05/08/22    PT Start Time 0400    PT Stop Time 0437    PT Time Calculation (min) 37 min    Activity Tolerance Patient tolerated treatment well    Behavior During Therapy [facility] for tasks assessed/performed             Past Medical History:  Diagnosis Date   Preeclampsia    Rheumatoid arthritis ([place])     Past Surgical History:  Procedure Laterality Date   CESAREAN SECTION      There were no vitals filed for this visit.   Subjective Assessment - 03/12/22 1732     Subjective Pain at a 5 today.  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    Pertinent History chronic back and bilateral knee pain    Limitations Standing;Walking    Patient Stated Goals reduced pain, be able to stand and walk longer    Currently in Pain? Yes    Pain Score 5     Pain Location Foot    Pain Orientation Right;Left    Pain Descriptors / Indicators Sore    Pain Type Chronic pain    Pain Onset More than a month ago                               [facility] Adult PT Treatment/Exercise - 03/12/22 0001       Exercises   Exercises Ankle      Manual Therapy   Manual Therapy Soft tissue mobilization    Soft tissue mobilization STW/M x 8 minutes each foot (plantar surfaces) 16 minutes total.      Ankle Exercises: Aerobic   Nustep Level 3 x 11 minutes.      Ankle Exercises: Standing   Rocker Board 4 minutes                          PT Long Term Goals - 02/10/22 1904       PT LONG TERM GOAL #1   Title Patient will be  independent with her HEP.    Time 5    Period Weeks    Status New    Target Date 03/17/22      PT LONG TERM GOAL #2   Title Patient will be able to complete her critical job demands without her familiar pain exceeding a 4/10.    Time 5    Period Weeks    Status New    Target Date 03/17/22      PT LONG TERM GOAL #3   Title Patient will be able to squat and lift at least 15 pounds without being limited by her familiar foot pain.    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  Patient did very well with treatment today and felt better following.    Personal Factors and Comorbidities Time since onset of injury/illness/exacerbation;Other    Examination-Activity Limitations Caring for Others;Locomotion Level;Bend;Carry;Squat;Stairs;Stand;Lift    Examination-Participation Restrictions Occupation;Cleaning    Stability/Clinical Decision Making Evolving/Moderate complexity    Rehab Potential Good    PT Frequency 2x / week    PT Duration --   5 weeks.   PT Treatment/Interventions ADLs/Self Care Home Management;Cryotherapy;Electrical Stimulation;Ultrasound;Moist Heat;Functional mobility training;Therapeutic activities;Therapeutic exercise;Neuromuscular re-education;Manual techniques;Patient/family education;Passive range of motion;Dry needling;Vasopneumatic Device;Taping    PT Next Visit Plan nustep, manual therapy, foot intrensic strengthening, and modalities as needed    Consulted and Agree with Plan of Care Patient             Patient will benefit from skilled therapeutic intervention in order to improve the following deficits and impairments:  Difficulty walking, Decreased activity tolerance, Pain, Decreased strength, Decreased mobility  Visit Diagnosis: Pain in left  foot  Pain in right foot     Problem List Patient Active Problem List   Diagnosis Date Noted   History of juvenile rheumatoid arthritis 02/18/2022   Swelling of toe of right foot 02/18/2022   Bilateral foot pain 02/18/2022   Vitamin D deficiency 05/21/2021   Encounter for surveillance of vaginal ring hormonal contraceptive device 02/14/2020   Anxiety and depression 11/10/2018   PTSD (post-traumatic stress disorder) 11/10/2018   Depression, recurrent ([place]) 01/15/2017    Thaine Garriga, [place], PT 03/12/2022, 5:42 PM  [facility] [address] [place], [place], [address] Phone: [phone]   Fax:  [phone]  Name: Maciel Kegg MRN: [id number] Date of Birth: [birth year]

## 2022-03-13 ENCOUNTER — Ambulatory Visit: Payer: BC Managed Care – PPO | Admitting: Podiatry

## 2022-03-15 ENCOUNTER — Other Ambulatory Visit: Payer: Self-pay | Admitting: Family Medicine

## 2022-03-15 DIAGNOSIS — F339 Major depressive disorder, recurrent, unspecified: Secondary | ICD-10-CM

## 2022-03-15 DIAGNOSIS — F419 Anxiety disorder, unspecified: Secondary | ICD-10-CM

## 2022-03-15 DIAGNOSIS — F4310 Post-traumatic stress disorder, unspecified: Secondary | ICD-10-CM

## 2022-03-16 ENCOUNTER — Ambulatory Visit: Payer: BC Managed Care – PPO | Admitting: Podiatry

## 2022-03-17 ENCOUNTER — Ambulatory Visit: Payer: BC Managed Care – PPO | Admitting: Physical Therapy

## 2022-03-17 ENCOUNTER — Encounter: Payer: Self-pay | Admitting: Physical Therapy

## 2022-03-17 DIAGNOSIS — M79671 Pain in right foot: Secondary | ICD-10-CM | POA: Diagnosis not present

## 2022-03-17 DIAGNOSIS — M79672 Pain in left foot: Secondary | ICD-10-CM | POA: Diagnosis not present

## 2022-03-17 NOTE — Therapy (Addendum)
[facility] [place], [place], [address] Phone: [phone]   Fax:  [phone]  Physical Therapy Treatment  Patient Details  Name: Kelly Campbell MRN: [id number] Date of Birth: [birth year] Referring Provider (PT): [place], [place]   Encounter Date: 03/17/2022   PT End of Session - 03/17/22 1731     Visit Number 9    Number of Visits 10    Date for PT Re-Evaluation 05/08/22    PT Start Time 0357    PT Stop Time 0430    PT Time Calculation (min) 33 min    Activity Tolerance Patient tolerated treatment well    Behavior During Therapy [facility] for tasks assessed/performed             Past Medical History:  Diagnosis Date   Preeclampsia    Rheumatoid arthritis ([place])     Past Surgical History:  Procedure Laterality Date   CESAREAN SECTION      There were no vitals filed for this visit.   Subjective Assessment - 03/17/22 1712     Subjective Pain at a 2 today.  Haven't worked the last couple of days.    Pertinent History chronic back and bilateral knee pain    Patient Stated Goals reduced pain, be able to stand and walk longer    Currently in Pain? Yes    Pain Score 2     Pain Location Foot    Pain Orientation Right;Left    Pain Descriptors / Indicators Sore    Pain Onset More than a month ago                               [facility] Adult PT Treatment/Exercise - 03/17/22 0001       Exercises   Exercises Ankle      Manual Therapy   Manual Therapy Soft tissue mobilization    Soft tissue mobilization STW/M x 7 minutes to each plantar surface of feet (14 minutes total).      Ankle Exercises: Aerobic   Nustep Level 3 x 10 minutes.      Ankle Exercises: Standing   Rocker Board 4 minutes                          PT Long Term Goals - 02/10/22 1904       PT LONG TERM GOAL #1   Title Patient will be independent with her HEP.    Time 5    Period Weeks    Status New    Target  Date 03/17/22      PT LONG TERM GOAL #2   Title Patient will be able to complete her critical job demands without her familiar pain exceeding a 4/10.    Time 5    Period Weeks    Status New    Target Date 03/17/22      PT LONG TERM GOAL #3   Title Patient will be able to squat and lift at least 15 pounds without being limited by her familiar foot pain.    Time 5    Period Weeks    Status New    Target Date 03/17/22                   Plan - 03/17/22 1717     Clinical Impression Statement Patient doing well since being off work the last couple of  days.  No pain reported after treatment.    Personal Factors and Comorbidities Time since onset of injury/illness/exacerbation;Other    Examination-Activity Limitations Caring for Others;Locomotion Level;Bend;Carry;Squat;Stairs;Stand;Lift    Examination-Participation Restrictions Occupation;Cleaning    Stability/Clinical Decision Making Evolving/Moderate complexity    Rehab Potential Good    PT Frequency 2x / week    PT Treatment/Interventions ADLs/Self Care Home Management;Cryotherapy;Electrical Stimulation;Ultrasound;Moist Heat;Functional mobility training;Therapeutic activities;Therapeutic exercise;Neuromuscular re-education;Manual techniques;Patient/family education;Passive range of motion;Dry needling;Vasopneumatic Device;Taping    PT Next Visit Plan D/c next visit.    Consulted and Agree with Plan of Care Patient             Patient will benefit from skilled therapeutic intervention in order to improve the following deficits and impairments:  Difficulty walking, Decreased activity tolerance, Pain, Decreased strength, Decreased mobility  Visit Diagnosis: Pain in left foot  Pain in right foot     Problem List Patient Active Problem List   Diagnosis Date Noted   History of juvenile rheumatoid arthritis 02/18/2022   Swelling of toe of right foot 02/18/2022   Bilateral foot pain 02/18/2022   Vitamin D deficiency  05/21/2021   Encounter for surveillance of vaginal ring hormonal contraceptive device 02/14/2020   Anxiety and depression 11/10/2018   PTSD (post-traumatic stress disorder) 11/10/2018   Depression, recurrent ([place]) 01/15/2017   Rationale for Evaluation and Treatment Rehabilitation  Jessah Danser, [place], PT 03/17/2022, 5:38 PM  [facility] [address] [place], [place], [address] Phone: [phone]   Fax:  [phone]  Name: Gari Trovato MRN: [id number] Date of Birth: Dec 01, 1990

## 2022-03-24 ENCOUNTER — Ambulatory Visit: Payer: BC Managed Care – PPO

## 2022-03-24 DIAGNOSIS — M79672 Pain in left foot: Secondary | ICD-10-CM | POA: Diagnosis not present

## 2022-03-24 DIAGNOSIS — M79671 Pain in right foot: Secondary | ICD-10-CM

## 2022-03-24 NOTE — Therapy (Signed)
[facility] [place], [place], [address] Phone: [phone]   Fax:  [phone]  Physical Therapy Treatment  Patient Details  Name: Kelly Campbell MRN: [id number] Date of Birth: [birth year] Referring Provider (PT): [place], [place]   Encounter Date: 03/24/2022   PT End of Session - 03/24/22 1615     Visit Number 10    Number of Visits 10    Date for PT Re-Evaluation 05/08/22    PT Start Time 1600    PT Stop Time 1630    PT Time Calculation (min) 30 min    Activity Tolerance Patient tolerated treatment well    Behavior During Therapy [facility] for tasks assessed/performed             Past Medical History:  Diagnosis Date   Preeclampsia    Rheumatoid arthritis ([place])     Past Surgical History:  Procedure Laterality Date   CESAREAN SECTION      There were no vitals filed for this visit.   Subjective Assessment - 03/24/22 1614     Subjective Patient reports that her feet feel like they have been getting better.    Pertinent History chronic back and bilateral knee pain    Patient Stated Goals reduced pain, be able to stand and walk longer    Currently in Pain? Yes    Pain Score 3     Pain Location Foot    Pain Orientation Right;Left    Pain Onset More than a month ago                               [facility] Adult PT Treatment/Exercise - 03/24/22 0001       Ankle Exercises: Aerobic   Nustep L4 x 19 minutes      Ankle Exercises: Stretches   Soleus Stretch 2 reps;30 seconds    Gastroc Stretch 2 reps;30 seconds                     PT Education - 03/24/22 1800     Education Details HEP, healing, using TENS as needed    Person(s) Educated Patient    Methods Explanation    Comprehension Verbalized understanding                 PT Long Term Goals - 03/24/22 1620       PT LONG TERM GOAL #1   Title Patient will be independent with her HEP.    Time 5    Period Weeks     Status Achieved    Target Date 03/17/22      PT LONG TERM GOAL #2   Title Patient will be able to complete her critical job demands without her familiar pain exceeding a 4/10.    Baseline can get up to a 6-7/10    Time 5    Period Weeks    Status Partially Met    Target Date 03/17/22      PT LONG TERM GOAL #3   Title Patient will be able to squat and lift at least 15 pounds without being limited by her familiar foot pain.    Time 5    Period Weeks    Status Achieved    Target Date 03/17/22                   Plan - 03/24/22 1615     Clinical Impression  Statement Patient has made good progress with physical therapy as evidenced by her progress toward her goals. She noted that her pain with work is her primary remaining limitation, but her HEP is helping to reduce this pain. Her HEP was reviewed and she reported feeling comfortable with these interventions and being discharged at this time.    Personal Factors and Comorbidities Time since onset of injury/illness/exacerbation;Other    Examination-Activity Limitations Caring for Others;Locomotion Level;Bend;Carry;Squat;Stairs;Stand;Lift    Examination-Participation Restrictions Occupation;Cleaning    Stability/Clinical Decision Making Evolving/Moderate complexity    Rehab Potential Good    PT Frequency 2x / week    PT Treatment/Interventions ADLs/Self Care Home Management;Cryotherapy;Electrical Stimulation;Ultrasound;Moist Heat;Functional mobility training;Therapeutic activities;Therapeutic exercise;Neuromuscular re-education;Manual techniques;Patient/family education;Passive range of motion;Dry needling;Vasopneumatic Device;Taping    PT Next Visit Plan D/c next visit.    Consulted and Agree with Plan of Care Patient             Patient will benefit from skilled therapeutic intervention in order to improve the following deficits and impairments:  Difficulty walking, Decreased activity tolerance, Pain, Decreased strength,  Decreased mobility  Visit Diagnosis: Pain in left foot  Pain in right foot     Problem List Patient Active Problem List   Diagnosis Date Noted   History of juvenile rheumatoid arthritis 02/18/2022   Swelling of toe of right foot 02/18/2022   Bilateral foot pain 02/18/2022   Vitamin D deficiency 05/21/2021   Encounter for surveillance of vaginal ring hormonal contraceptive device 02/14/2020   Anxiety and depression 11/10/2018   PTSD (post-traumatic stress disorder) 11/10/2018   Depression, recurrent ([place]) 01/15/2017   Rationale for Evaluation and Treatment Rehabilitation   Darlin Coco, PT 03/24/2022, 6:03 PM  [facility] [place], [place], [address] Phone: [phone]   Fax:  [phone]  Name: Ellaina Schuler MRN: [id number] Date of Birth: 04-Mar-1991  PHYSICAL THERAPY DISCHARGE SUMMARY  Visits from Start of Care: 10  Current functional level related to goals / functional outcomes: Patient was able to meet most of her goals for physical therapy and reported feeling comfortable being discharged at this time.    Remaining deficits: Increased pain with prolonged standing    Education / Equipment: HEP    Patient agrees to discharge. Patient goals were partially met. Patient is being discharged due to being pleased with the current functional level.

## 2022-04-03 ENCOUNTER — Ambulatory Visit: Payer: BC Managed Care – PPO | Admitting: Family Medicine

## 2022-04-03 ENCOUNTER — Encounter: Payer: Self-pay | Admitting: Family Medicine

## 2022-04-03 VITALS — BP 126/88 | HR 90 | Temp 98.0°F | Ht 67.0 in | Wt 260.0 lb

## 2022-04-03 DIAGNOSIS — F339 Major depressive disorder, recurrent, unspecified: Secondary | ICD-10-CM

## 2022-04-03 DIAGNOSIS — R5383 Other fatigue: Secondary | ICD-10-CM

## 2022-04-03 DIAGNOSIS — F32A Depression, unspecified: Secondary | ICD-10-CM

## 2022-04-03 DIAGNOSIS — F4310 Post-traumatic stress disorder, unspecified: Secondary | ICD-10-CM

## 2022-04-03 DIAGNOSIS — M130 Polyarthritis, unspecified: Secondary | ICD-10-CM

## 2022-04-03 DIAGNOSIS — F419 Anxiety disorder, unspecified: Secondary | ICD-10-CM

## 2022-04-03 DIAGNOSIS — G43009 Migraine without aura, not intractable, without status migrainosus: Secondary | ICD-10-CM

## 2022-04-03 DIAGNOSIS — J4520 Mild intermittent asthma, uncomplicated: Secondary | ICD-10-CM | POA: Diagnosis not present

## 2022-04-03 DIAGNOSIS — R6889 Other general symptoms and signs: Secondary | ICD-10-CM | POA: Diagnosis not present

## 2022-04-03 MED ORDER — BUPROPION HCL ER (XL) 300 MG PO TB24: 300.0000 mg | ORAL_TABLET | Freq: Every day | ORAL | 1 refills | Status: DC

## 2022-04-03 MED ORDER — SERTRALINE HCL 100 MG PO TABS: ORAL_TABLET | ORAL | 1 refills | Status: DC

## 2022-04-03 MED ORDER — ALBUTEROL SULFATE HFA 108 (90 BASE) MCG/ACT IN AERS: 1.0000 | INHALATION_SPRAY | Freq: Four times a day (QID) | RESPIRATORY_TRACT | 0 refills | Status: AC | PRN

## 2022-04-03 MED ORDER — HYDROXYZINE PAMOATE 25 MG PO CAPS: ORAL_CAPSULE | ORAL | 1 refills | Status: DC

## 2022-04-03 MED ORDER — DICLOFENAC SODIUM 75 MG PO TBEC: 75.0000 mg | DELAYED_RELEASE_TABLET | Freq: Two times a day (BID) | ORAL | 2 refills | Status: DC

## 2022-04-03 MED ORDER — TOPIRAMATE 100 MG PO TABS: 100.0000 mg | ORAL_TABLET | Freq: Every evening | ORAL | 1 refills | Status: DC | PRN

## 2022-04-04 LAB — CMP14+EGFR
ALT: 13 IU/L (ref 0–32)
AST: 10 IU/L (ref 0–40)
Albumin/Globulin Ratio: 1.8 (ref 1.2–2.2)
Albumin: 4.2 g/dL (ref 3.9–5.0)
Alkaline Phosphatase: 109 IU/L (ref 44–121)
BUN/Creatinine Ratio: 11 (ref 9–23)
BUN: 7 mg/dL (ref 6–20)
Bilirubin Total: 0.2 mg/dL (ref 0.0–1.2)
CO2: 18 mmol/L — ABNORMAL LOW (ref 20–29)
Calcium: 9.7 mg/dL (ref 8.7–10.2)
Chloride: 105 mmol/L (ref 96–106)
Creatinine, Ser: 0.66 mg/dL (ref 0.57–1.00)
Globulin, Total: 2.4 g/dL (ref 1.5–4.5)
Glucose: 95 mg/dL (ref 70–99)
Potassium: 4 mmol/L (ref 3.5–5.2)
Sodium: 139 mmol/L (ref 134–144)
Total Protein: 6.6 g/dL (ref 6.0–8.5)
eGFR: 121 mL/min/{1.73_m2} (ref >59)

## 2022-04-04 LAB — THYROID PANEL WITH TSH
Free Thyroxine Index: 2.5 (ref 1.2–4.9)
T3 Uptake Ratio: 22 % — ABNORMAL LOW (ref 24–39)
T4, Total: 11.5 ug/dL (ref 4.5–12.0)
TSH: 0.946 u[IU]/mL (ref 0.450–4.500)

## 2022-04-04 LAB — CBC WITH DIFFERENTIAL/PLATELET
Basophils Absolute: 0 10*3/uL (ref 0.0–0.2)
Basos: 0 %
EOS (ABSOLUTE): 0.1 10*3/uL (ref 0.0–0.4)
Eos: 1 %
Hematocrit: 37.5 % (ref 34.0–46.6)
Hemoglobin: 12.7 g/dL (ref 11.1–15.9)
Immature Grans (Abs): 0 10*3/uL (ref 0.0–0.1)
Immature Granulocytes: 0 %
Lymphocytes Absolute: 2.3 10*3/uL (ref 0.7–3.1)
Lymphs: 25 %
MCH: 28.9 pg (ref 26.6–33.0)
MCHC: 33.9 g/dL (ref 31.5–35.7)
MCV: 85 fL (ref 79–97)
Monocytes Absolute: 0.4 10*3/uL (ref 0.1–0.9)
Monocytes: 4 %
Neutrophils Absolute: 6.4 10*3/uL (ref 1.4–7.0)
Neutrophils: 70 %
Platelets: 286 10*3/uL (ref 150–450)
RBC: 4.39 x10E6/uL (ref 3.77–5.28)
RDW: 12.5 % (ref 11.7–15.4)
WBC: 9.2 10*3/uL (ref 3.4–10.8)

## 2022-04-06 ENCOUNTER — Ambulatory Visit (INDEPENDENT_AMBULATORY_CARE_PROVIDER_SITE_OTHER): Payer: BC Managed Care – PPO | Admitting: Podiatry

## 2022-04-06 DIAGNOSIS — M7662 Achilles tendinitis, left leg: Secondary | ICD-10-CM

## 2022-04-06 DIAGNOSIS — M7989 Other specified soft tissue disorders: Secondary | ICD-10-CM

## 2022-04-06 DIAGNOSIS — M7661 Achilles tendinitis, right leg: Secondary | ICD-10-CM | POA: Diagnosis not present

## 2022-04-09 ENCOUNTER — Encounter: Payer: Self-pay | Admitting: Family Medicine

## 2022-04-09 DIAGNOSIS — R0681 Apnea, not elsewhere classified: Secondary | ICD-10-CM | POA: Diagnosis not present

## 2022-04-10 DIAGNOSIS — R0681 Apnea, not elsewhere classified: Secondary | ICD-10-CM | POA: Diagnosis not present

## 2022-04-13 ENCOUNTER — Encounter: Payer: Self-pay | Admitting: Podiatry

## 2022-04-13 NOTE — Progress Notes (Signed)
Subjective: 31 year old female presents the office today for follow-up evaluation of right second toe swelling, Achilles tendinitis.  She states that she is doing about the same.  She presented the ultrasound of the toe and recommend MRI.  She has been to physical therapy.  She also did follow-up with rheumatology.  No recent injury or changes otherwise.  Objective: AAO x3, NAD DP/PT pulses palpable bilaterally, CRT less than 3 seconds There is still swelling present to right second toe mostly plantarly.  Does not appear to be well defined mass today for more fluid more diffuse on the plantar aspect the digit.  She does get tenderness palpation of the area.  She still has tenderness to palpation along the distal portion of the Achilles tendon along the insertion of the calcaneus.  There is no pain with lateral compression of the calcaneus.  No edema, erythema. No pain with calf compression, swelling, warmth, erythema  Assessment: Chronic insertional heel pain, Achilles tendinitis, swelling right second toe  Plan: -All treatment options discussed with the patient including all alternatives, risks, complications.  -At this point given ongoing symptoms the second time we will order an MRI to further evaluate this.  This is for potential surgical planning.  We discussed injection today we will hold off on this -Regards to the Achilles tendon already continue shoes, good arch support, stretching, rehab exercises to be done at home.  No ongoing nature of this and also to order MRI of the right ankle. -Patient encouraged to call the office with any questions, concerns, change in symptoms.   Trula Slade DPM

## 2022-04-25 ENCOUNTER — Other Ambulatory Visit: Payer: BC Managed Care – PPO

## 2022-05-07 ENCOUNTER — Other Ambulatory Visit: Payer: Self-pay | Admitting: Family Medicine

## 2022-05-18 ENCOUNTER — Ambulatory Visit (INDEPENDENT_AMBULATORY_CARE_PROVIDER_SITE_OTHER): Payer: BC Managed Care – PPO | Admitting: Podiatry

## 2022-05-18 DIAGNOSIS — M7662 Achilles tendinitis, left leg: Secondary | ICD-10-CM | POA: Diagnosis not present

## 2022-05-18 DIAGNOSIS — M7661 Achilles tendinitis, right leg: Secondary | ICD-10-CM

## 2022-05-18 DIAGNOSIS — M7989 Other specified soft tissue disorders: Secondary | ICD-10-CM | POA: Diagnosis not present

## 2022-05-18 NOTE — Progress Notes (Unsigned)
Subjective:  Denies any systemic complaints such as fevers, chills, nausea, vomiting. No acute changes since last appointment, and no other complaints at this time.   Objective: AAO x3, NAD DP/PT pulses palpable bilaterally, CRT less than 3 seconds Protective sensation intact with Simms Weinstein monofilament, vibratory sensation intact, Achilles tendon reflex intact No areas of pinpoint bony tenderness or pain with vibratory sensation. MMT 5/5, ROM WNL. No edema, erythema, increase in warmth to bilateral lower extremities.  No open lesions or pre-ulcerative lesions.  No pain with calf compression, swelling, warmth, erythema  Assessment:  Plan: -All treatment options discussed with the patient including all alternatives, risks, complications.  -dex inj -Patient encouraged to call the office with any questions, concerns, change in symptoms.

## 2022-05-27 ENCOUNTER — Ambulatory Visit: Payer: BC Managed Care – PPO | Admitting: Family Medicine

## 2022-05-27 ENCOUNTER — Encounter: Payer: Self-pay | Admitting: Family Medicine

## 2022-05-27 VITALS — BP 126/85 | HR 100 | Temp 98.0°F | Ht 67.0 in | Wt 265.0 lb

## 2022-05-27 DIAGNOSIS — M7551 Bursitis of right shoulder: Secondary | ICD-10-CM | POA: Diagnosis not present

## 2022-05-27 DIAGNOSIS — M7552 Bursitis of left shoulder: Secondary | ICD-10-CM | POA: Diagnosis not present

## 2022-05-27 NOTE — Progress Notes (Signed)
BP 126/85   Pulse 100   Temp 98 F (36.7 C)   Ht '5\' 7"'$  (1.702 m)   Wt 265 lb (120.2 kg)   SpO2 99%   BMI 41.50 kg/m    Subjective:   Patient ID: Kelly Campbell, female    DOB: [birth year], 31 y.o.   MRN: [id number]  HPI: Kelly Campbell is a 31 y.o. female presenting on 05/27/2022 for Shoulder Pain (Bilateral- started about 59mago. Believed it was from sleeping wrong but then both shoulders began to hurt)   HPI Patient is coming in today with complaints of bilateral shoulder pain.  She says started with her right shoulder when she was laying on it and then it started hurting and then about 2 weeks later it started with the other shoulder and she thinks that is because of laying on the other side.  She does tend to be a side sleeper.  She says that she has been using ice and heat and diclofenac and they help a little but it still seems to be progressing and getting worse.  She says it keeps her up at night and she cannot sleep and she is just gotten to the point where she needs some help.  She denies any specific trauma or pain that caused it.  She does work in a very mRetail buyerand physical labor job but she cannot recall any specific incident that flared it up but just that its been coming on gradually  Relevant past medical, surgical, family and social history reviewed and updated as indicated. Interim medical history since our last visit reviewed. Allergies and medications reviewed and updated.  Review of Systems  Constitutional:  Negative for chills and fever.  Eyes:  Negative for visual disturbance.  Respiratory:  Negative for chest tightness and shortness of breath.   Cardiovascular:  Negative for chest pain and leg swelling.  Musculoskeletal:  Positive for arthralgias and myalgias. Negative for back pain and gait problem.  Skin:  Negative for rash.  Neurological:  Negative for light-headedness and headaches.  Psychiatric/Behavioral:  Negative for agitation and behavioral  problems.   All other systems reviewed and are negative.   Per HPI unless specifically indicated above   Allergies as of 05/27/2022       Reactions   Penicillins         Medication List        Accurate as of May 27, 2022  8:47 AM. If you have any questions, ask your nurse or doctor.          albuterol 108 (90 Base) MCG/ACT inhaler Commonly known as: VENTOLIN HFA Inhale 1-2 puffs into the lungs every 6 (six) hours as needed for wheezing or shortness of breath.   buPROPion 300 MG 24 hr tablet Commonly known as: Wellbutrin XL Take 1 tablet (300 mg total) by mouth daily.   diclofenac 75 MG EC tablet Commonly known as: VOLTAREN Take 1 tablet (75 mg total) by mouth 2 (two) times daily.   etonogestrel-ethinyl estradiol 0.12-0.015 MG/24HR vaginal ring Commonly known as: EluRyng INSERT 1 RING VAGINALLY AND LEAVE IN PLACE FOR 3 CONSECUTIVE WEEKS THEN REMOVE FOR 1 WEEK   fluticasone 50 MCG/ACT nasal spray Commonly known as: FLONASE Place 2 sprays into both nostrils daily.   hydrOXYzine 25 MG capsule Commonly known as: VISTARIL TAKE 1 CAPSULE BY MOUTH TWICE DAILY AS NEEDED   rizatriptan 10 MG tablet Commonly known as: Maxalt Take 1 tablet (10 mg total) by mouth as  needed for migraine. May repeat in 2 hours if needed   sertraline 100 MG tablet Commonly known as: ZOLOFT TAKE 1 & 1/2 (ONE & ONE-HALF) TABLETS BY MOUTH ONCE DAILY   topiramate 100 MG tablet Commonly known as: Topamax Take 1 tablet (100 mg total) by mouth at bedtime as needed.   Vitamin D (Ergocalciferol) 1.25 MG (50000 UNIT) Caps capsule Commonly known as: DRISDOL Take 1 capsule by mouth once a week         Objective:   BP 126/85   Pulse 100   Temp 98 F (36.7 C)   Ht '5\' 7"'$  (1.702 m)   Wt 265 lb (120.2 kg)   SpO2 99%   BMI 41.50 kg/m   Wt Readings from Last 3 Encounters:  05/27/22 265 lb (120.2 kg)  04/03/22 260 lb (117.9 kg)  03/10/22 263 lb (119.3 kg)    Physical Exam Vitals  and nursing note reviewed.  Constitutional:      Appearance: Normal appearance. She is obese.  Musculoskeletal:     Right shoulder: Tenderness present. No deformity, bony tenderness or crepitus. Normal strength.     Left shoulder: Tenderness present. No deformity, bony tenderness or crepitus. Normal strength.       Arms:  Neurological:     Mental Status: She is alert.       Assessment & Plan:   Problem List Items Addressed This Visit   None Visit Diagnoses     Bursitis of both shoulders    -  Primary   Relevant Orders   Ambulatory referral to Physical Therapy       We will do PT referral and continue with the current regiment that she has been doing. Follow up plan: Return if symptoms worsen or fail to improve.  Counseling provided for all of the vaccine components Orders Placed This Encounter  Procedures   Ambulatory referral to Physical Therapy    Caryl Pina, MD [facility] 05/27/2022, 8:47 AM

## 2022-06-04 ENCOUNTER — Encounter: Payer: Self-pay | Admitting: Physical Therapy

## 2022-06-04 ENCOUNTER — Ambulatory Visit: Payer: BC Managed Care – PPO | Attending: Family Medicine | Admitting: Physical Therapy

## 2022-06-04 DIAGNOSIS — M25511 Pain in right shoulder: Secondary | ICD-10-CM | POA: Insufficient documentation

## 2022-06-04 DIAGNOSIS — M25512 Pain in left shoulder: Secondary | ICD-10-CM | POA: Insufficient documentation

## 2022-06-04 DIAGNOSIS — M7552 Bursitis of left shoulder: Secondary | ICD-10-CM | POA: Diagnosis not present

## 2022-06-04 DIAGNOSIS — M7551 Bursitis of right shoulder: Secondary | ICD-10-CM | POA: Diagnosis not present

## 2022-06-04 NOTE — Therapy (Signed)
OUTPATIENT PHYSICAL THERAPY SHOULDER EVALUATION   Patient Name: Kelly Campbell MRN: [id number] DOB:[birth year], 31 y.o., female Today's Date: 06/04/2022   PT End of Session - 06/04/22 1540     Visit Number 1    Number of Visits 10    Date for PT Re-Evaluation 07/09/22    Authorization Type FOTO.    PT Start Time 0315    PT Stop Time 0357    PT Time Calculation (min) 42 min    Activity Tolerance Patient tolerated treatment well    Behavior During Therapy [facility] for tasks assessed/performed             Past Medical History:  Diagnosis Date   Preeclampsia    Rheumatoid arthritis ([place])    Past Surgical History:  Procedure Laterality Date   CESAREAN SECTION     Patient Active Problem List   Diagnosis Date Noted   History of juvenile rheumatoid arthritis 02/18/2022   Swelling of toe of right foot 02/18/2022   Bilateral foot pain 02/18/2022   Vitamin D deficiency 05/21/2021   Encounter for surveillance of vaginal ring hormonal contraceptive device 02/14/2020   Anxiety and depression 11/10/2018   PTSD (post-traumatic stress disorder) 11/10/2018   Depression, recurrent ([place]) 01/15/2017    REFERRING PROVIDER: Vonna Kotyk Dettinger MD  REFERRING DIAG: Bursitis of both shoulders.  THERAPY DIAG:  Acute pain of right shoulder  Acute pain of left shoulder  Rationale for Evaluation and Treatment Rehabilitation  ONSET DATE: About a month ago.  SUBJECTIVE:                                                                                                                                                                                      SUBJECTIVE STATEMENT: The patient presents to the clinic with bilateral shoulder pain that started about a month ago.  Her right shoulder is significantly worse than the left.  It is painful to lie on her sides.  She states her job also requires use does a lot of repetitive movements.  Her pain is rated at a 6/10 today.  Ice rest and sometimes  stretching decreases her pain.  Lifting, movement and reaching increases her pain.  PERTINENT HISTORY: RA.  PAIN:  Are you having pain? Yes: NPRS scale: 6/10 Pain location: Bilateral shoulders (RT > LT) Pain description: Ache, throb, sore and stiff. Aggravating factors: As above. Relieving factors: As above.  PRECAUTIONS: None  WEIGHT BEARING RESTRICTIONS No  FALLS:  Has patient fallen in last 6 months? No  LIVING ENVIRONMENT: Lives with: lives with their spouse Lives in: House/apartment Has following equipment at home: None  OCCUPATION: Patient works at [employer].  PLOF:  Independent  PATIENT GOALS Get out of pain.  OBJECTIVE:   PATIENT SURVEYS:  FOTO    POSTURE: Rounded shoulder.  UPPER EXTREMITY ROM:   Full active bilateral shoulder range of motion though motions are performed slowly and painfully on the right.  UPPER EXTREMITY MMT:  IR/ER strength tested normally with elbow by side.  SHOULDER SPECIAL TESTS:  Impingement tests:  Negative.    Negative Drop Arm test.  PALPATION:  Very tender to palpation over right middle deltoid region on right and acromial ridge region on left.   TODAY'S TREATMENT:  IFC x 15 minutes to patient's right shoulder with vasopneumatic.   ASSESSMENT:  CLINICAL IMPRESSION: The patient presents to OPPT with c/o bilateral shoulder pain right > left that has been ongoing for about a month.  Her has full active range of motion but performed slowly and painfully on the right.  Her RTC strength is normal.  She has no signs of Impingement.  She demonstrates a negative Drop Arm test.  She is very tender to palpation over her right middle deltoid region and acromial ridge region/middle deltoid on the left. Patient will benefit from skilled physical therapy intervention to address pain and deficits.  OBJECTIVE IMPAIRMENTS decreased activity tolerance and pain.   ACTIVITY LIMITATIONS lifting and sleeping  PARTICIPATION LIMITATIONS:  occupation  REHAB POTENTIAL: Excellent  CLINICAL DECISION MAKING: Stable/uncomplicated  EVALUATION COMPLEXITY: Low   GOALS:   LONG TERM GOALS: Target date: 07/09/2022  (Remove Blue Hyperlink)  Ind with an HEP. Baseline:  Goal status: INITIAL  2.  Sleep undisturbed 6 hours. Baseline:  Goal status: INITIAL  3.  Perform ADL's with pain not > 2-3/10. Baseline:  Goal status: INITIAL   PLAN: PT FREQUENCY: 2x/week  PT DURATION: other: 5 weeks.  PLANNED INTERVENTIONS: Therapeutic exercises, Therapeutic activity, Patient/Family education, Dry Needling, Electrical stimulation, Cryotherapy, Moist heat, Vasopneumatic device, Ultrasound, and Manual therapy  PLAN FOR NEXT SESSION: Combo e'stim/US, STW/M, RW4.   Arnelle Nale, [place], PT 06/04/2022, 4:06 PM

## 2022-06-09 ENCOUNTER — Encounter: Payer: Self-pay | Admitting: Physical Therapy

## 2022-06-09 ENCOUNTER — Ambulatory Visit: Payer: BC Managed Care – PPO | Admitting: Physical Therapy

## 2022-06-09 DIAGNOSIS — M7551 Bursitis of right shoulder: Secondary | ICD-10-CM | POA: Diagnosis not present

## 2022-06-09 DIAGNOSIS — M25511 Pain in right shoulder: Secondary | ICD-10-CM

## 2022-06-09 DIAGNOSIS — M25512 Pain in left shoulder: Secondary | ICD-10-CM

## 2022-06-09 DIAGNOSIS — M7552 Bursitis of left shoulder: Secondary | ICD-10-CM | POA: Diagnosis not present

## 2022-06-09 NOTE — Therapy (Signed)
OUTPATIENT PHYSICAL THERAPY SHOULDER EVALUATION   Patient Name: Kelly Campbell MRN: [id number] DOB:1991/04/30, 31 y.o., female Today's Date: 06/09/2022   PT End of Session - 06/09/22 1600     Visit Number 2    Number of Visits 10    Date for PT Re-Evaluation 07/09/22    Authorization Type FOTO.    PT Start Time 1603    PT Stop Time 1646    PT Time Calculation (min) 43 min    Activity Tolerance Patient tolerated treatment well    Behavior During Therapy [facility] for tasks assessed/performed             Past Medical History:  Diagnosis Date   Preeclampsia    Rheumatoid arthritis ([place])    Past Surgical History:  Procedure Laterality Date   CESAREAN SECTION     Patient Active Problem List   Diagnosis Date Noted   History of juvenile rheumatoid arthritis 02/18/2022   Swelling of toe of right foot 02/18/2022   Bilateral foot pain 02/18/2022   Vitamin D deficiency 05/21/2021   Encounter for surveillance of vaginal ring hormonal contraceptive device 02/14/2020   Anxiety and depression 11/10/2018   PTSD (post-traumatic stress disorder) 11/10/2018   Depression, recurrent ([place]) 01/15/2017    REFERRING PROVIDER: Vonna Kotyk Dettinger MD  REFERRING DIAG: Bursitis of both shoulders.  THERAPY DIAG:  Acute pain of right shoulder  Acute pain of left shoulder  Rationale for Evaluation and Treatment Rehabilitation  ONSET DATE: About a month ago.  SUBJECTIVE:                                                                                                                                                                                      SUBJECTIVE STATEMENT: Reports pain is minimally better today. Has worked today and has to lift heavy boxes repeatedly.  PERTINENT HISTORY: RA.  PAIN:  Are you having pain? Yes: NPRS scale: 5/10 Pain location: Bilateral shoulders (RT > LT) Pain description: Ache, throb, sore and stiff. Aggravating factors: As above. Relieving factors: As  above.  PRECAUTIONS: None  WEIGHT BEARING RESTRICTIONS No  FALLS:  Has patient fallen in last 6 months? No  LIVING ENVIRONMENT: Lives with: lives with their spouse Lives in: House/apartment Has following equipment at home: None  OCCUPATION: Patient works at [employer].  PLOF: Independent  PATIENT GOALS Get out of pain.  OBJECTIVE:   PATIENT SURVEYS:  FOTO    Modalities  Date: 06/09/2022 Unattended Estim: Shoulder, IFC, 15 mins, Pain and Tone Combo: Shoulder, 1.5 w/cm2, 100%, 10 mins, Pain and Tone Hot Pack: Shoulder, 15 mins, Pain and Tone  Manual Therapy Myofascial Release: B deltoids, IASTW to  reduce tone and pain    ASSESSMENT:  CLINICAL IMPRESSION: Patient presented in clinic with reports of mildly decreased B shoulder pain since evaluation. 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 Patient able to tolerate conservative treatment well for B shoulders. Patient advised that soreness with IASTW could occur but tolerated IASTW well. No reports of soreness to B deltoids with IASTW during the session. Normal modalities response noted following removal of the modalities. Patient also advised to monitor symptoms until next visit.  OBJECTIVE IMPAIRMENTS decreased activity tolerance and pain.   ACTIVITY LIMITATIONS lifting and sleeping  PARTICIPATION LIMITATIONS: occupation  REHAB POTENTIAL: Excellent  CLINICAL DECISION MAKING: Stable/uncomplicated  EVALUATION COMPLEXITY: Low   GOALS:   LONG TERM GOALS: Target date: 07/14/2022  (Remove Blue Hyperlink)  Ind with an HEP. Baseline:  Goal status: INITIAL  2.  Sleep undisturbed 6 hours. Baseline:  Goal status: INITIAL  3.  Perform ADL's with pain not > 2-3/10. Baseline:  Goal status: INITIAL   PLAN: PT FREQUENCY: 2x/week  PT DURATION: other: 5 weeks.  PLANNED INTERVENTIONS: Therapeutic exercises, Therapeutic activity, Patient/Family education, Dry Needling,  Electrical stimulation, Cryotherapy, Moist heat, Vasopneumatic device, Ultrasound, and Manual therapy  PLAN FOR NEXT SESSION: Combo e'stim/US, STW/M, RW4.   Standley Brooking, PTA 06/09/2022, 4:55 PM

## 2022-06-11 ENCOUNTER — Ambulatory Visit: Payer: BC Managed Care – PPO | Admitting: Physical Therapy

## 2022-06-11 DIAGNOSIS — M25511 Pain in right shoulder: Secondary | ICD-10-CM

## 2022-06-11 DIAGNOSIS — M25512 Pain in left shoulder: Secondary | ICD-10-CM | POA: Diagnosis not present

## 2022-06-11 DIAGNOSIS — M7551 Bursitis of right shoulder: Secondary | ICD-10-CM | POA: Diagnosis not present

## 2022-06-11 DIAGNOSIS — M7552 Bursitis of left shoulder: Secondary | ICD-10-CM | POA: Diagnosis not present

## 2022-06-11 NOTE — Therapy (Signed)
OUTPATIENT PHYSICAL THERAPY SHOULDER TREATMENT   Patient Name: Kelly Campbell MRN: [id number] DOB:[birth year], 31 y.o., female Today's Date: 06/11/2022   PT End of Session - 06/11/22 1727     Visit Number 3    Number of Visits 10    Date for PT Re-Evaluation 07/09/22    Authorization Type FOTO.    PT Start Time 0402    PT Stop Time 0444    PT Time Calculation (min) 42 min    Activity Tolerance Patient tolerated treatment well    Behavior During Therapy [facility] for tasks assessed/performed             Past Medical History:  Diagnosis Date   Preeclampsia    Rheumatoid arthritis ([place])    Past Surgical History:  Procedure Laterality Date   CESAREAN SECTION     Patient Active Problem List   Diagnosis Date Noted   History of juvenile rheumatoid arthritis 02/18/2022   Swelling of toe of right foot 02/18/2022   Bilateral foot pain 02/18/2022   Vitamin D deficiency 05/21/2021   Encounter for surveillance of vaginal ring hormonal contraceptive device 02/14/2020   Anxiety and depression 11/10/2018   PTSD (post-traumatic stress disorder) 11/10/2018   Depression, recurrent ([place]) 01/15/2017    REFERRING PROVIDER: Vonna Kotyk Dettinger MD  REFERRING DIAG: Bursitis of both shoulders.  THERAPY DIAG:  Acute pain of right shoulder  Acute pain of left shoulder  Rationale for Evaluation and Treatment Rehabilitation  ONSET DATE: About a month ago.  SUBJECTIVE:                                                                                                                                                                                      SUBJECTIVE STATEMENT: Pain at a 4 today. PERTINENT HISTORY: RA.  PAIN:  Are you having pain? Yes: NPRS scale: 4/10 Pain location: Bilateral shoulders (RT > LT) Pain description: Ache, throb, sore and stiff. Aggravating factors: As above. Relieving factors: As above.   OBJECTIVE:   PATIENT SURVEYS:  FOTO    Modalities  06/11/22:   Pre-mod e'stim at 80-150 Hz x 10 minutes to each shoulder f/b Combo e'stim/US at 1.50 W/CM2 x 8 minutes to each shoulder (16 minutes), f/b STW/M x 7 minutes to patient's right shoulder.  Manual Therapy Myofascial Release: B deltoids, IASTW to reduce tone and pain    ASSESSMENT:  CLINICAL IMPRESSION: Patient feeling better today.  Treatment to bilateral middle deltoid regions with good response today.  Normal modality response following removal of modalities. OBJECTIVE IMPAIRMENTS decreased activity tolerance and pain.    GOALS:   LONG TERM GOALS: Target date: 07/16/2022  (Remove Blue Hyperlink)  Ind with an HEP. Baseline:  Goal status: INITIAL  2.  Sleep undisturbed 6 hours. Baseline:  Goal status: INITIAL  3.  Perform ADL's with pain not > 2-3/10. Baseline:  Goal status: INITIAL   PLAN: PT FREQUENCY: 2x/week  PT DURATION: other: 5 weeks.  PLANNED INTERVENTIONS: Therapeutic exercises, Therapeutic activity, Patient/Family education, Dry Needling, Electrical stimulation, Cryotherapy, Moist heat, Vasopneumatic device, Ultrasound, and Manual therapy  PLAN FOR NEXT SESSION: Combo e'stim/US, STW/M, RW4.   Xavien Dauphinais, [place], PT 06/11/2022, 5:35 PM

## 2022-06-16 ENCOUNTER — Ambulatory Visit: Payer: BC Managed Care – PPO | Attending: Family Medicine | Admitting: Physical Therapy

## 2022-06-16 DIAGNOSIS — M25512 Pain in left shoulder: Secondary | ICD-10-CM | POA: Diagnosis present

## 2022-06-16 DIAGNOSIS — M79672 Pain in left foot: Secondary | ICD-10-CM | POA: Insufficient documentation

## 2022-06-16 DIAGNOSIS — M79671 Pain in right foot: Secondary | ICD-10-CM | POA: Insufficient documentation

## 2022-06-16 DIAGNOSIS — M25511 Pain in right shoulder: Secondary | ICD-10-CM | POA: Insufficient documentation

## 2022-06-16 NOTE — Therapy (Signed)
OUTPATIENT PHYSICAL THERAPY SHOULDER TREATMENT   Patient Name: Kelly Campbell MRN: [id number] DOB:[birth year], 31 y.o., female Today's Date: 06/16/2022   PT End of Session - 06/16/22 1625     Visit Number 4    Number of Visits 10    Date for PT Re-Evaluation 07/09/22    Authorization Type FOTO.    PT Start Time 0347    PT Stop Time 0437    PT Time Calculation (min) 50 min    Activity Tolerance Patient tolerated treatment well    Behavior During Therapy [facility] for tasks assessed/performed             Past Medical History:  Diagnosis Date   Preeclampsia    Rheumatoid arthritis ([place])    Past Surgical History:  Procedure Laterality Date   CESAREAN SECTION     Patient Active Problem List   Diagnosis Date Noted   History of juvenile rheumatoid arthritis 02/18/2022   Swelling of toe of right foot 02/18/2022   Bilateral foot pain 02/18/2022   Vitamin D deficiency 05/21/2021   Encounter for surveillance of vaginal ring hormonal contraceptive device 02/14/2020   Anxiety and depression 11/10/2018   PTSD (post-traumatic stress disorder) 11/10/2018   Depression, recurrent ([place]) 01/15/2017    REFERRING PROVIDER: Vonna Kotyk Dettinger MD  REFERRING DIAG: Bursitis of both shoulders.  THERAPY DIAG:  Acute pain of right shoulder  Acute pain of left shoulder  Rationale for Evaluation and Treatment Rehabilitation  ONSET DATE: About a month ago.  SUBJECTIVE:                                                                                                                                                                                      SUBJECTIVE STATEMENT: Did good after last treatment but pain starting coming back.   PERTINENT HISTORY: RA.  PAIN:  Are you having pain? Yes: NPRS scale: 4/10 Pain location: Bilateral shoulders (RT > LT) Pain description: Ache, throb, sore and stiff. Aggravating factors: As above. Relieving factors: As above.   OBJECTIVE:   PATIENT SURVEYS:   FOTO    Modalities  06/16/22:  Pre-mod e'stim at 80-150 Hz x 15 minutes to each shoulder f/b Combo e'stim/US at 1.50 W/CM2 x 8 minutes to each shoulder (16 minutes), f/b STW/M x 8 minutes to patient's right shoulder.  Manual Therapy Myofascial Release: B deltoids, IASTW to reduce tone and pain    ASSESSMENT:  CLINICAL IMPRESSION: Pain reported at night, especially right shoulder.  She responded well to treatment today.  Most pain reported on right. OBJECTIVE IMPAIRMENTS decreased activity tolerance and pain.    GOALS:   LONG TERM GOALS: Target date: 07/21/2022  (  Remove Blue Hyperlink)  Ind with an HEP. Baseline:  Goal status: INITIAL  2.  Sleep undisturbed 6 hours. Baseline:  Goal status: INITIAL  3.  Perform ADL's with pain not > 2-3/10. Baseline:  Goal status: INITIAL   PLAN: PT FREQUENCY: 2x/week  PT DURATION: other: 5 weeks.  PLANNED INTERVENTIONS: Therapeutic exercises, Therapeutic activity, Patient/Family education, Dry Needling, Electrical stimulation, Cryotherapy, Moist heat, Vasopneumatic device, Ultrasound, and Manual therapy  PLAN FOR NEXT SESSION: Combo e'stim/US, STW/M, RW4.   [place], [place], PT 06/16/2022, 4:39 PM

## 2022-06-30 ENCOUNTER — Ambulatory Visit: Payer: BC Managed Care – PPO | Admitting: Physical Therapy

## 2022-06-30 DIAGNOSIS — M25511 Pain in right shoulder: Secondary | ICD-10-CM | POA: Diagnosis not present

## 2022-06-30 DIAGNOSIS — M79671 Pain in right foot: Secondary | ICD-10-CM

## 2022-06-30 DIAGNOSIS — M25512 Pain in left shoulder: Secondary | ICD-10-CM

## 2022-06-30 DIAGNOSIS — M79672 Pain in left foot: Secondary | ICD-10-CM

## 2022-06-30 NOTE — Therapy (Signed)
OUTPATIENT PHYSICAL THERAPY SHOULDER TREATMENT   Patient Name: Kelly Campbell MRN: [id number] DOB:[birth year], 31 y.o., female Today's Date: 06/30/2022   PT End of Session - 06/30/22 1654     Visit Number 5    Number of Visits 10    Date for PT Re-Evaluation 07/09/22    Authorization Type FOTO.    PT Start Time 0400    PT Stop Time 0449    PT Time Calculation (min) 49 min    Activity Tolerance Patient tolerated treatment well    Behavior During Therapy [facility] for tasks assessed/performed             Past Medical History:  Diagnosis Date   Preeclampsia    Rheumatoid arthritis ([place])    Past Surgical History:  Procedure Laterality Date   CESAREAN SECTION     Patient Active Problem List   Diagnosis Date Noted   History of juvenile rheumatoid arthritis 02/18/2022   Swelling of toe of right foot 02/18/2022   Bilateral foot pain 02/18/2022   Vitamin D deficiency 05/21/2021   Encounter for surveillance of vaginal ring hormonal contraceptive device 02/14/2020   Anxiety and depression 11/10/2018   PTSD (post-traumatic stress disorder) 11/10/2018   Depression, recurrent ([place]) 01/15/2017    REFERRING PROVIDER: Vonna Kotyk Dettinger MD  REFERRING DIAG: Bursitis of both shoulders.  THERAPY DIAG:  Acute pain of right shoulder  Acute pain of left shoulder  Pain in left foot  Pain in right foot  Rationale for Evaluation and Treatment Rehabilitation  ONSET DATE: About a month ago.  SUBJECTIVE:                                                                                                                                                                                      SUBJECTIVE STATEMENT: Rt shoulder 7/10, left 4/10. PERTINENT HISTORY: RA.  PAIN:  See above. OBJECTIVE:   PATIENT SURVEYS:  FOTO    Modalities  06/30/22:  Pre-mod e'stim at 80-150 Hz x 15 minutes to each shoulder f/b Combo e'stim/US at 1.50 W/CM2 x 12 minutes to right  shoulder f/b STW/M x 11  minutes to patient's right shoulder including IASTM.   ASSESSMENT:  CLINICAL IMPRESSION: Pain with increased pain today.  Last visit was on 06/16/22.  She did well today and felt much better after treatment.  Good tolerance for IASTM.  Patient states he pain tends to return when she is going to bed. OBJECTIVE IMPAIRMENTS decreased activity tolerance and pain.    GOALS:   LONG TERM GOALS: Target date: 08/04/2022  (Remove Blue Hyperlink)  Ind with an HEP. Baseline:  Goal status: INITIAL  2.  Sleep undisturbed  6 hours. Baseline:  Goal status: INITIAL  3.  Perform ADL's with pain not > 2-3/10. Baseline:  Goal status: INITIAL   PLAN: PT FREQUENCY: 2x/week  PT DURATION: other: 5 weeks.  PLANNED INTERVENTIONS: Therapeutic exercises, Therapeutic activity, Patient/Family education, Dry Needling, Electrical stimulation, Cryotherapy, Moist heat, Vasopneumatic device, Ultrasound, and Manual therapy  PLAN FOR NEXT SESSION: Combo e'stim/US, STW/M, RW4.   Kierre Hintz, [place], PT 06/30/2022, 4:56 PM

## 2022-07-02 ENCOUNTER — Ambulatory Visit: Payer: BC Managed Care – PPO | Admitting: Physical Therapy

## 2022-07-02 DIAGNOSIS — M25512 Pain in left shoulder: Secondary | ICD-10-CM

## 2022-07-02 DIAGNOSIS — M25511 Pain in right shoulder: Secondary | ICD-10-CM

## 2022-07-02 IMAGING — US US ABDOMEN LIMITED
1 series · 14 of 25 positions shown · non-contrast
Comparison: None.

CLINICAL DATA: Right upper quadrant pain beginning 1511 hours

EXAM:
ULTRASOUND ABDOMEN LIMITED RIGHT UPPER QUADRANT

[Series 1: us abdomen limited ruq (liver/gb) · 14 of 83 slices shown]
[im 1/83]
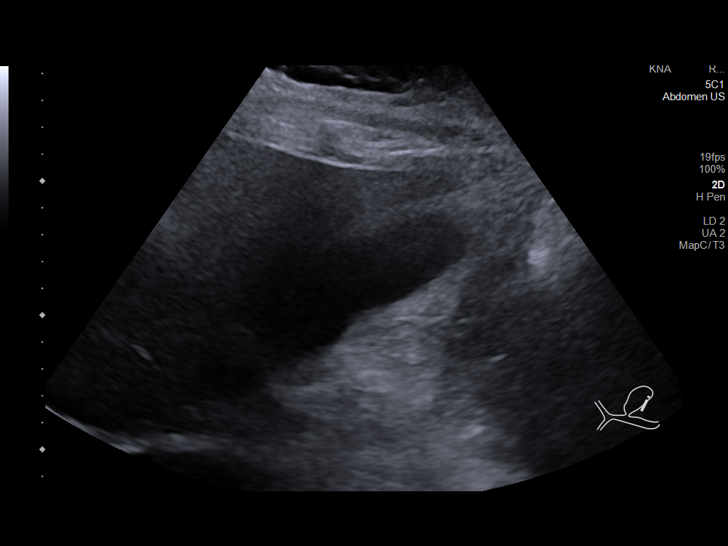
[im 7/83]
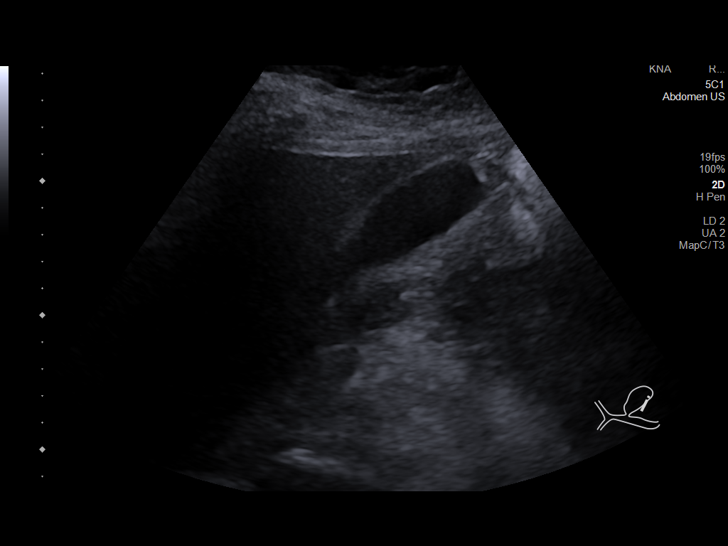
[im 14/83]
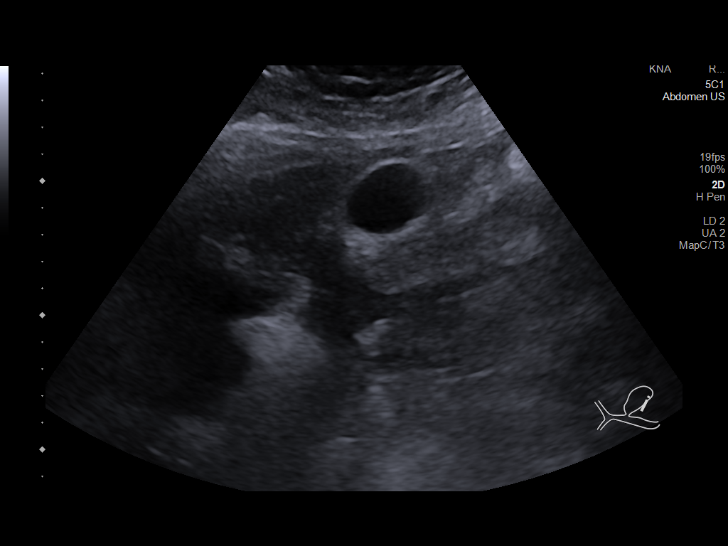
[im 21/83]
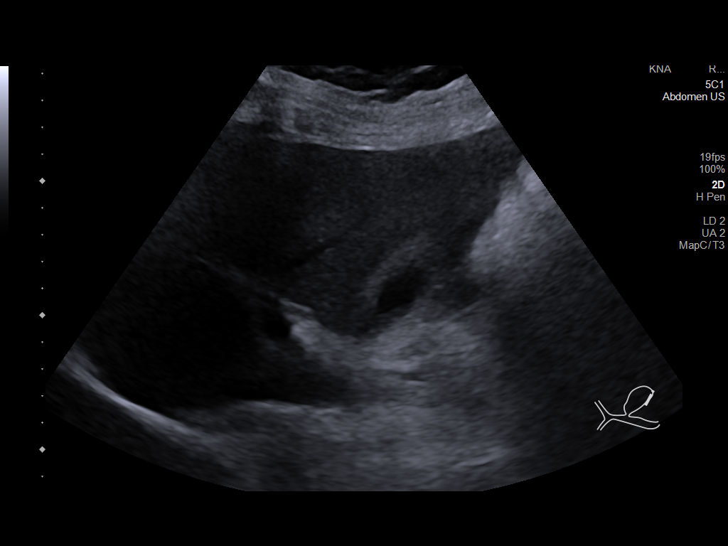
[im 28/83]
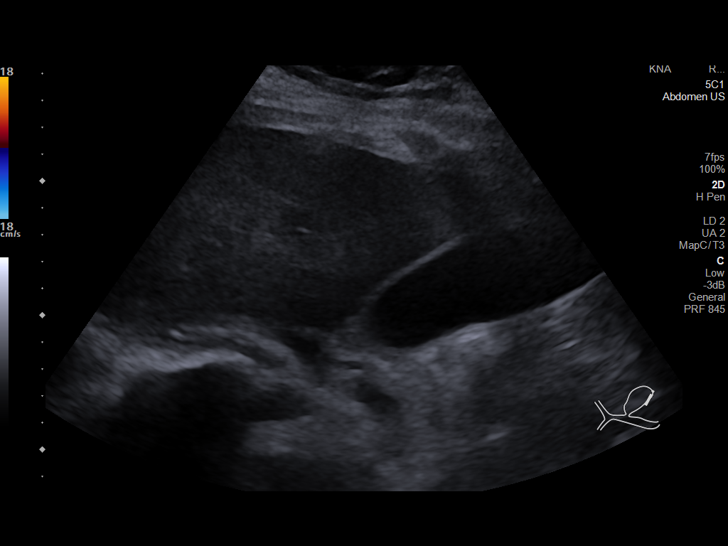
[im 31/83]
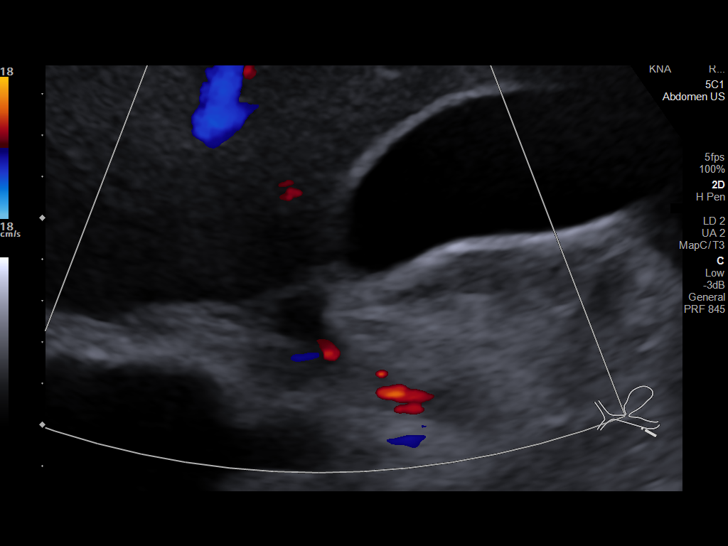
[im 38/83]
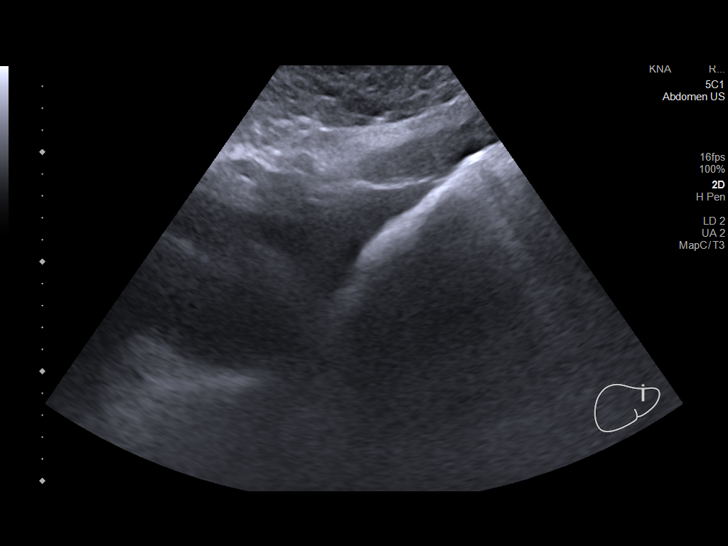
[im 45/83]
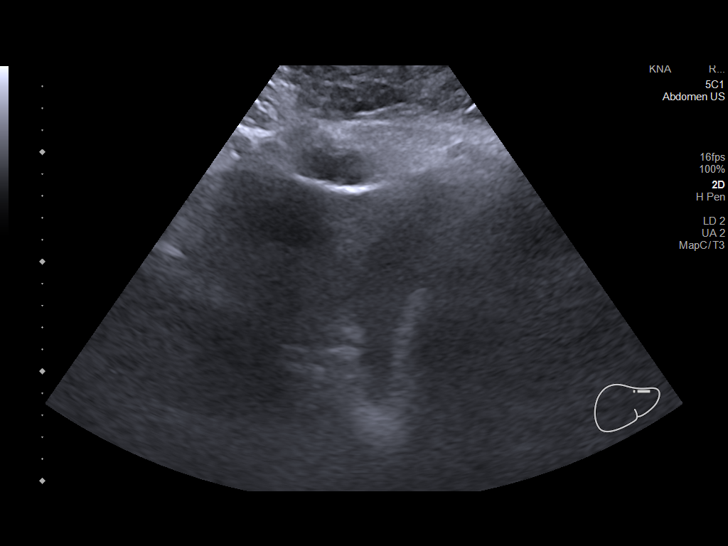
[im 52/83]
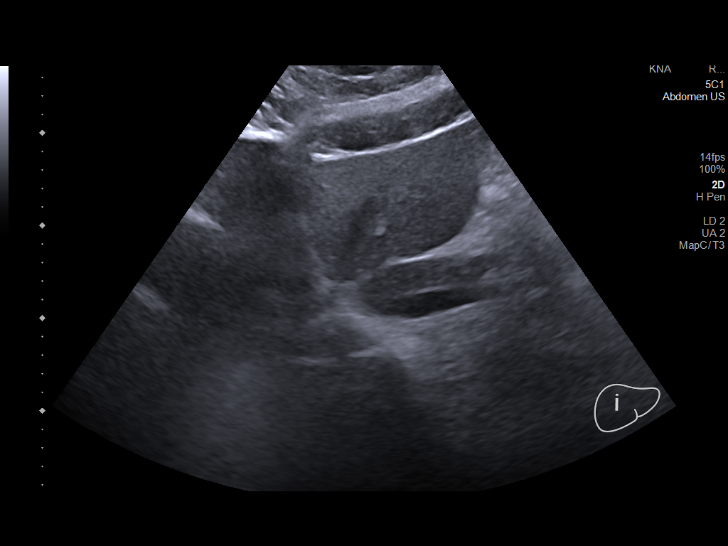
[im 55/83]
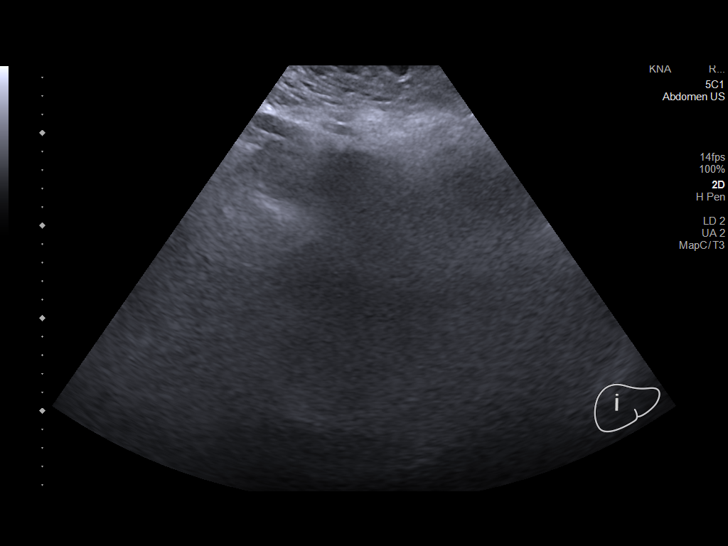
[im 62/83]
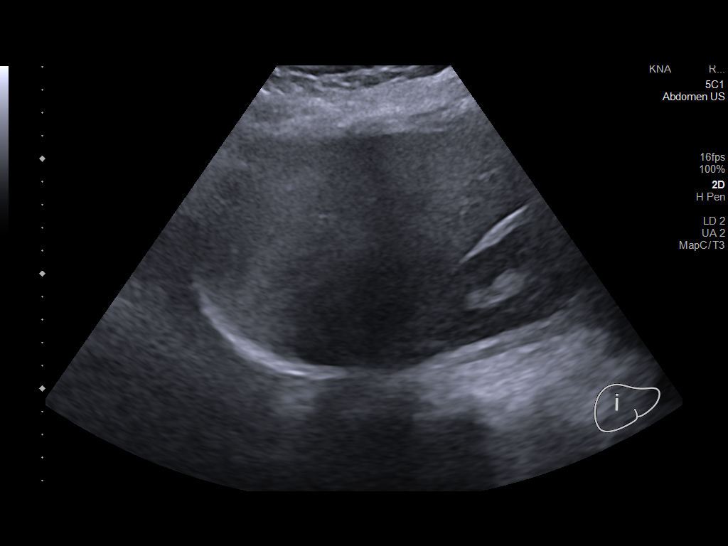
[im 69/83]
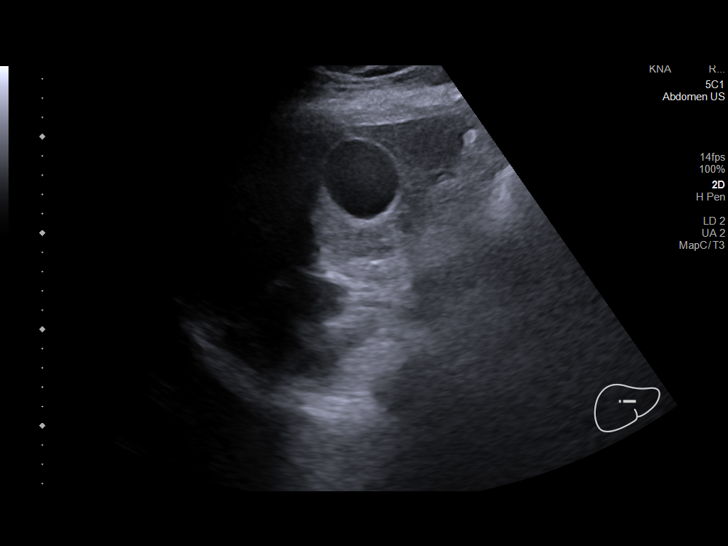
[im 76/83]
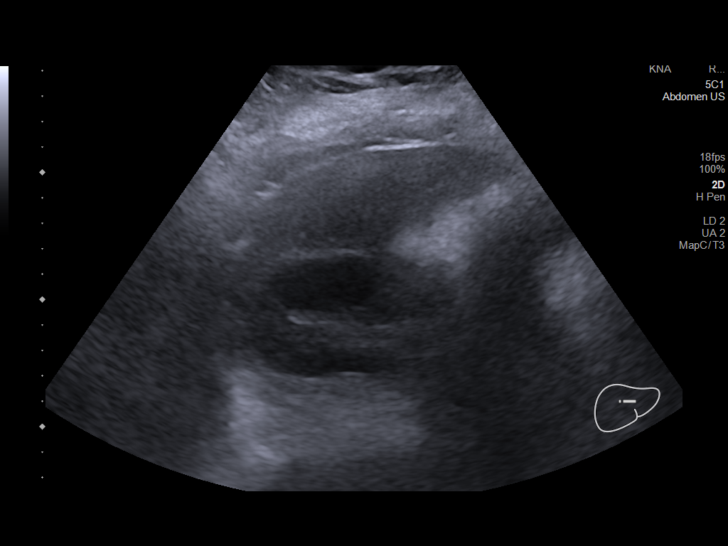
[im 83/83]
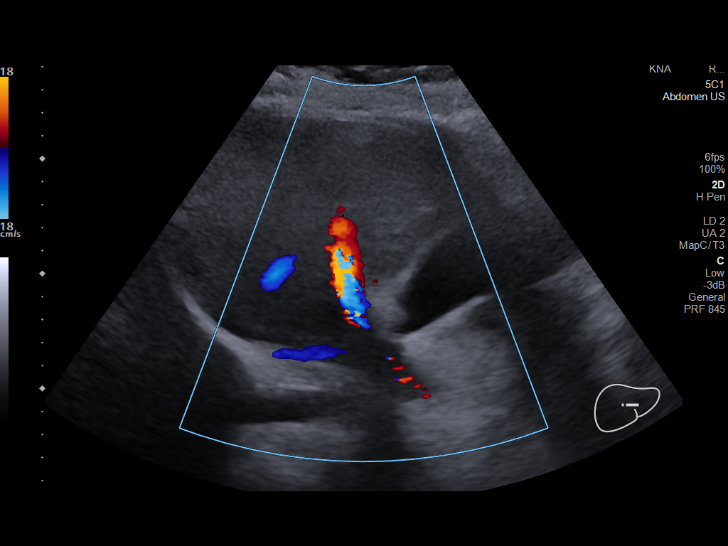

[14 of 25 positions shown; findings below may reference images not displayed]

FINDINGS: Gallbladder:

No gallstones or wall thickening visualized. No sonographic Murphy
sign noted by sonographer.

Common bile duct:

Diameter: 3.6 mm, normal.

Liver:

Mildly increased echogenicity suggesting mild fatty change. Portal
vein is patent on color Doppler imaging with normal direction of
blood flow towards the liver.

Other: None.
IMPRESSION: No acute finding. No evidence cholecystitis. Mildly increased
echogenicity of the liver suggesting mild fatty change.

## 2022-07-02 IMAGING — CT CT RENAL STONE PROTOCOL
2 of 4 series · 16 of 46 positions shown, 18 images · non-contrast
Comparison: None.

CLINICAL DATA: Right flank pain

EXAM:
CT ABDOMEN AND PELVIS WITHOUT CONTRAST
TECHNIQUE: Multidetector CT imaging of the abdomen and pelvis was performed
following the standard protocol without IV contrast.

[Series 2: axial st · axial · 0.98mm/px · z∈[+746,+1211]mm · 13 of 107 slices shown, 15 images]
[im 7/107  soft-tissue]
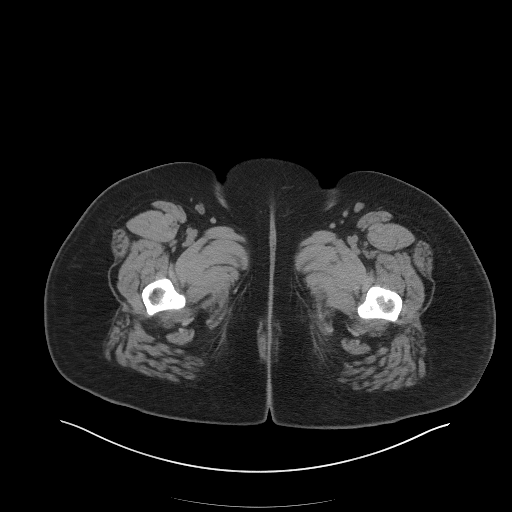
[im 7/107  bone]
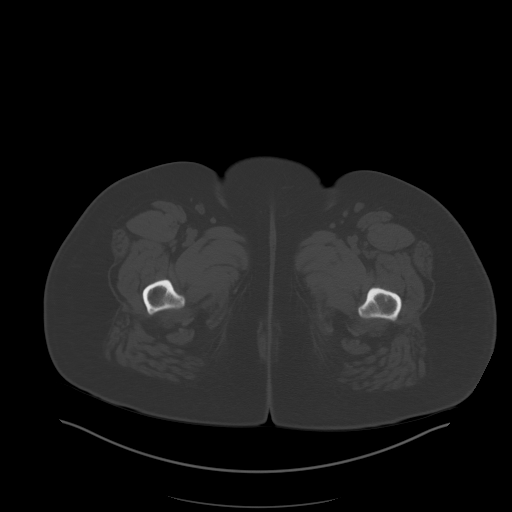
[im 13/107  soft-tissue]
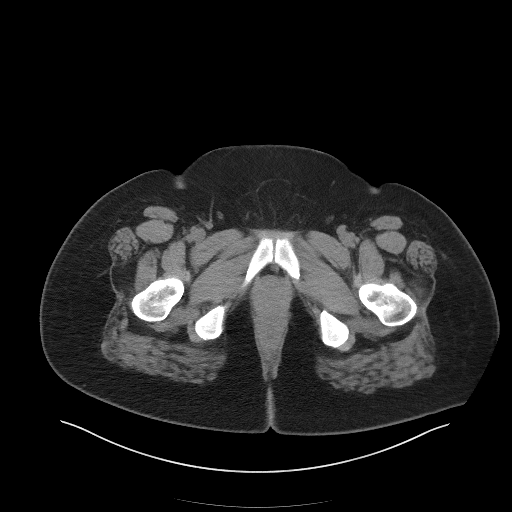
[im 25/107  soft-tissue]
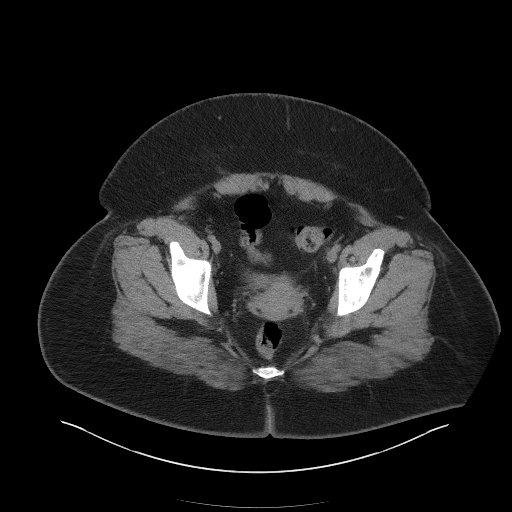
[im 32/107  soft-tissue]
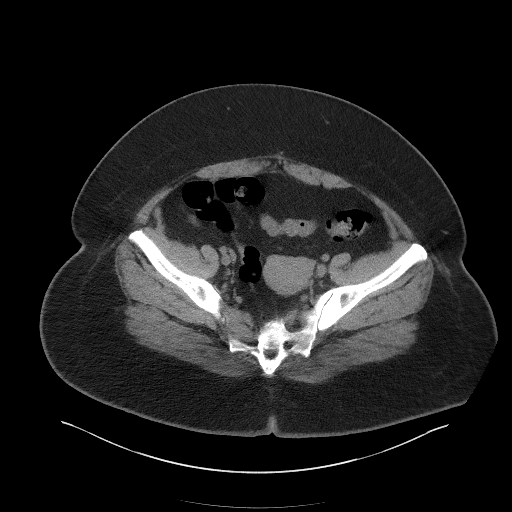
[im 38/107  soft-tissue]
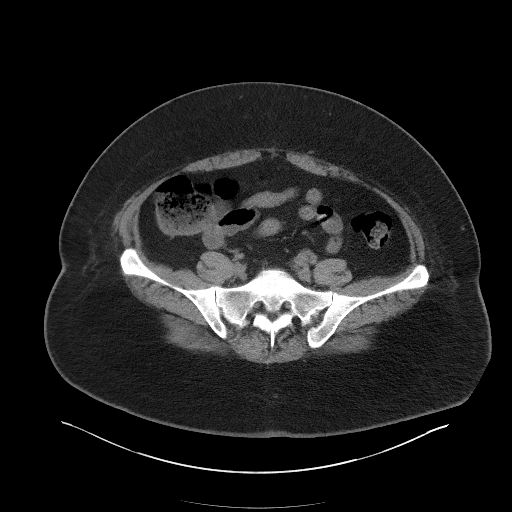
[im 44/107  soft-tissue]
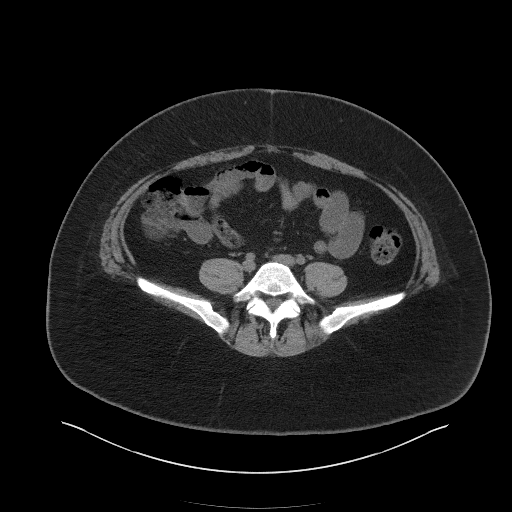
[im 57/107  soft-tissue]
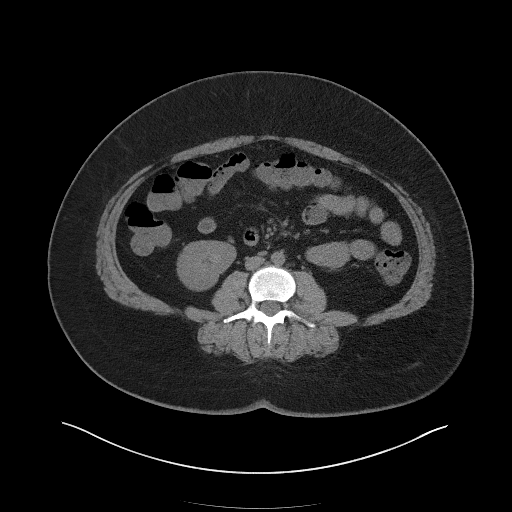
[im 63/107  soft-tissue]
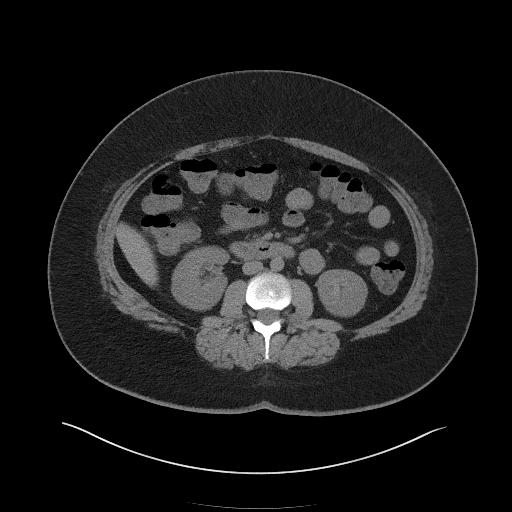
[im 69/107  soft-tissue]
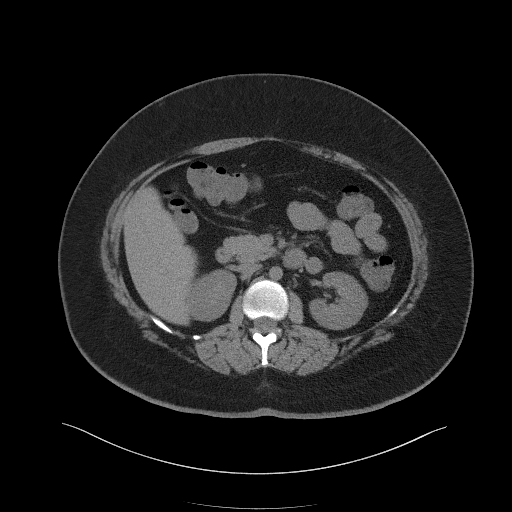
[im 69/107  bone]
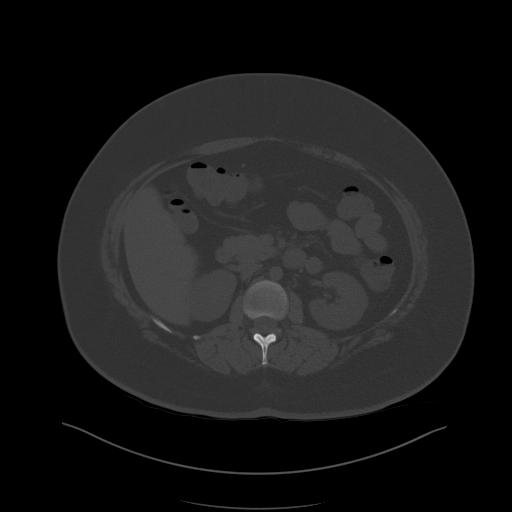
[im 75/107  soft-tissue]
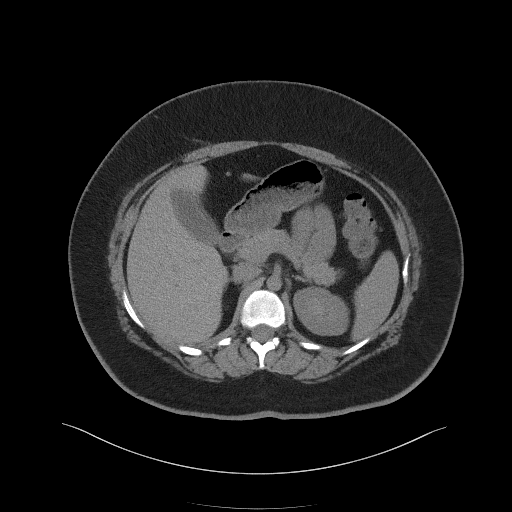
[im 82/107  soft-tissue]
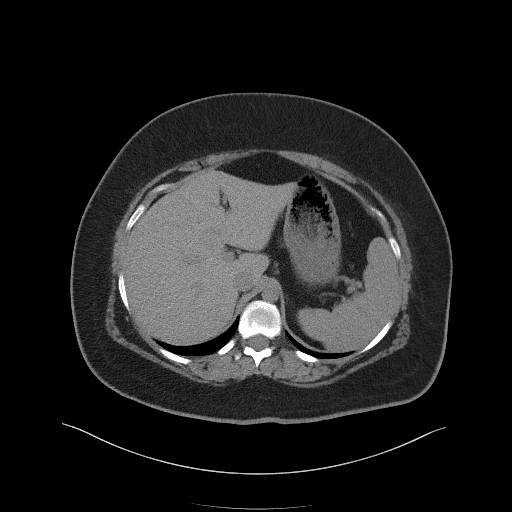
[im 94/107  soft-tissue]
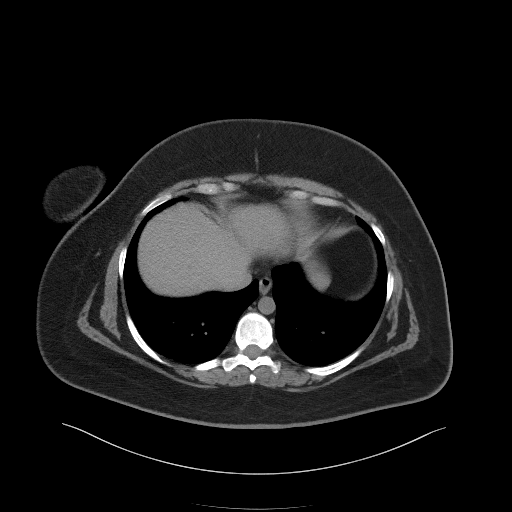
[im 100/107  soft-tissue]
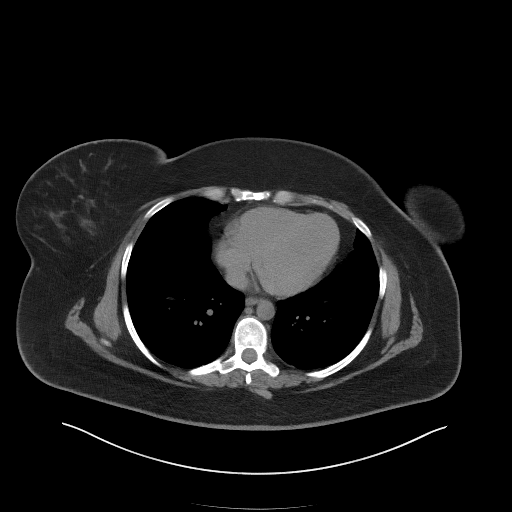

[Series 5: coronal st · coronal · 0.89mm/px · 3 of 128 slices shown]
[im 43/128  soft-tissue]
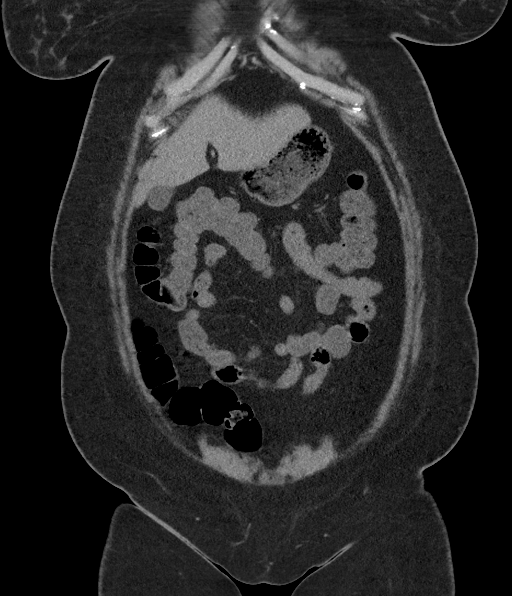
[im 57/128  soft-tissue]
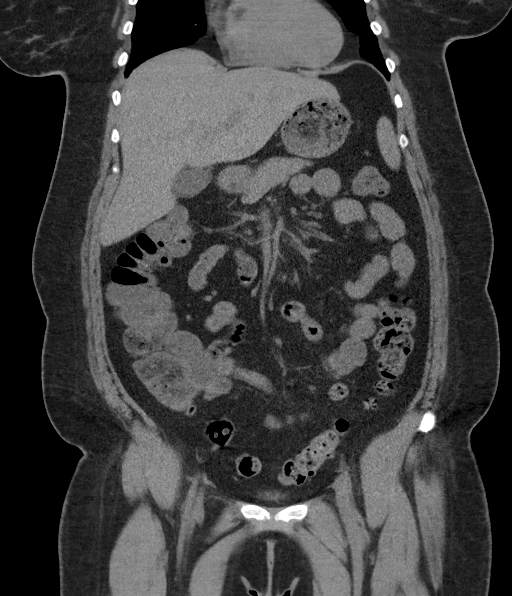
[im 71/128  soft-tissue]
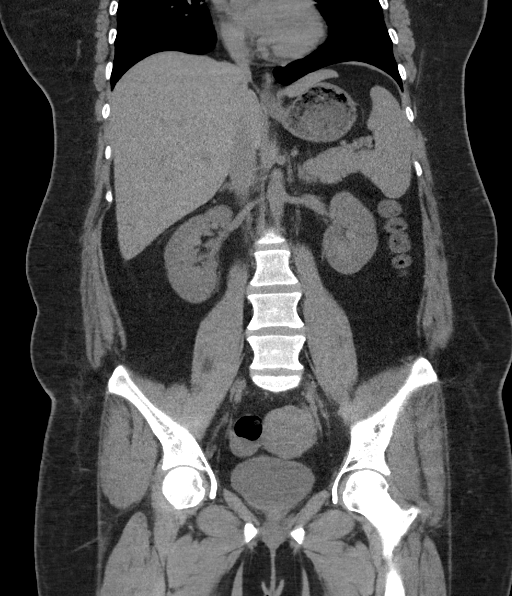

[16 of 46 positions shown; findings below may reference images not displayed]

FINDINGS: Lower chest: Unremarkable.

Hepatobiliary: Liver measures 19.7 cm in length. There is calcific
density in the fundus of the gallbladder. There is mild wall
thickening in gallbladder. There is no dilation of bile ducts.

Pancreas: No focal abnormality is seen.

Spleen: Unremarkable.

Adrenals/Urinary Tract: Adrenals are not enlarged. There is no
hydronephrosis. There is 4 mm calculus in the midportion of right
kidney. Ureters are not dilated. Urinary bladder is not distended.

Stomach/Bowel: Stomach is unremarkable. Small bowel loops are not
dilated. Appendix is not dilated. There is no significant wall
thickening in the colon. There is no pericolic stranding or fluid
collection.

Vascular/Lymphatic: Unremarkable.

Reproductive: Mild lobulations are seen in the margin of uterus
suggesting possible small fibroids. There is small caliber ring-like
structure in the vaginal canal possibly contraceptive device.

Other: There is no ascites or pneumoperitoneum. Small umbilical
hernia containing fat is seen.

Musculoskeletal: Bulging of annulus is seen causing extrinsic
pressure over the ventral margin of thecal sac and encroachment of
neural foramina at L4-L5 and L5-S1 levels.
IMPRESSION: There is no evidence of intestinal obstruction or pneumoperitoneum.
There is no hydronephrosis. Appendix is not dilated.

Gallbladder stone. There is mild diffuse wall thickening in
gallbladder. This may suggest acute or chronic inflammation. There
is no dilation of bile ducts. If clinically warranted, gallbladder
sonogram may be considered.

There is 4 mm nonobstructing right renal stone. Enlarged liver.
Possible small uterine fibroids. Lumbar spondylosis with
encroachment of neural foramina at L4-L5 and L5-S1 levels.

## 2022-07-02 NOTE — Therapy (Signed)
OUTPATIENT PHYSICAL THERAPY SHOULDER TREATMENT   Patient Name: Kelly Campbell MRN: [id number] DOB:1990-10-31, 31 y.o., female Today's Date: 07/02/2022   PT End of Session - 07/02/22 1721     Visit Number 6    Number of Visits 10    Date for PT Re-Evaluation 07/09/22    Authorization Type FOTO.    PT Start Time 0400    PT Stop Time 0452    PT Time Calculation (min) 52 min    Activity Tolerance Patient tolerated treatment well    Behavior During Therapy [facility] for tasks assessed/performed             Past Medical History:  Diagnosis Date   Preeclampsia    Rheumatoid arthritis ([place])    Past Surgical History:  Procedure Laterality Date   CESAREAN SECTION     Patient Active Problem List   Diagnosis Date Noted   History of juvenile rheumatoid arthritis 02/18/2022   Swelling of toe of right foot 02/18/2022   Bilateral foot pain 02/18/2022   Vitamin D deficiency 05/21/2021   Encounter for surveillance of vaginal ring hormonal contraceptive device 02/14/2020   Anxiety and depression 11/10/2018   PTSD (post-traumatic stress disorder) 11/10/2018   Depression, recurrent ([place]) 01/15/2017    REFERRING PROVIDER: Vonna Kotyk Dettinger MD  REFERRING DIAG: Bursitis of both shoulders.  THERAPY DIAG:  Acute pain of right shoulder  Acute pain of left shoulder  Rationale for Evaluation and Treatment Rehabilitation  ONSET DATE: About a month ago.  SUBJECTIVE:                                                                                                                                                                                      SUBJECTIVE STATEMENT: Rt shoulder 4/10, left 3/10. PERTINENT HISTORY: RA.  PAIN:  See above. OBJECTIVE:   PATIENT SURVEYS:  FOTO    Modalities   Combo e'stim/US at 1.50 W/CM2 x 8 minutes to each shoulder (16 minutes total) minutes to right  shoulder f/b STW/M x 8 minutes to patient's right shoulder including IASTM f/b Pre-mod e'stim at  80-150 Hz x 20 minutes to each shoulder.   ASSESSMENT:  CLINICAL IMPRESSION:  Patient states that last treatment was helpful and she slept better.  Good response to today's treatment as well.  OBJECTIVE IMPAIRMENTS decreased activity tolerance and pain.    GOALS:   LONG TERM GOALS: Target date: 08/06/2022  (Remove Blue Hyperlink)  Ind with an HEP. Baseline:  Goal status: INITIAL  2.  Sleep undisturbed 6 hours. Baseline:  Goal status: INITIAL  3.  Perform ADL's with pain not > 2-3/10. Baseline:  Goal status: INITIAL  PLAN: PT FREQUENCY: 2x/week  PT DURATION: other: 5 weeks.  PLANNED INTERVENTIONS: Therapeutic exercises, Therapeutic activity, Patient/Family education, Dry Needling, Electrical stimulation, Cryotherapy, Moist heat, Vasopneumatic device, Ultrasound, and Manual therapy  PLAN FOR NEXT SESSION: Combo e'stim/US, STW/M, RW4.   Shan Valdes, [place], PT 07/02/2022, 5:23 PM

## 2022-07-06 ENCOUNTER — Encounter: Payer: Self-pay | Admitting: Family Medicine

## 2022-07-06 ENCOUNTER — Ambulatory Visit: Payer: BC Managed Care – PPO | Admitting: Family Medicine

## 2022-07-06 VITALS — BP 129/82 | HR 103 | Temp 97.4°F | Ht 67.0 in | Wt 258.0 lb

## 2022-07-06 DIAGNOSIS — E785 Hyperlipidemia, unspecified: Secondary | ICD-10-CM

## 2022-07-06 DIAGNOSIS — G43009 Migraine without aura, not intractable, without status migrainosus: Secondary | ICD-10-CM | POA: Diagnosis not present

## 2022-07-06 MED ORDER — SEMAGLUTIDE-WEIGHT MANAGEMENT 0.5 MG/0.5ML ~~LOC~~ SOAJ: 0.5000 mg | SUBCUTANEOUS | 0 refills | Status: AC

## 2022-07-06 MED ORDER — SEMAGLUTIDE-WEIGHT MANAGEMENT 1.7 MG/0.75ML ~~LOC~~ SOAJ: 1.7000 mg | SUBCUTANEOUS | 0 refills | Status: DC

## 2022-07-06 MED ORDER — SEMAGLUTIDE-WEIGHT MANAGEMENT 1 MG/0.5ML ~~LOC~~ SOAJ: 1.0000 mg | SUBCUTANEOUS | 0 refills | Status: AC

## 2022-07-06 MED ORDER — SEMAGLUTIDE-WEIGHT MANAGEMENT 0.25 MG/0.5ML ~~LOC~~ SOAJ: 0.2500 mg | SUBCUTANEOUS | 0 refills | Status: AC

## 2022-07-06 MED ORDER — SEMAGLUTIDE-WEIGHT MANAGEMENT 2.4 MG/0.75ML ~~LOC~~ SOAJ: 2.4000 mg | SUBCUTANEOUS | 1 refills | Status: DC

## 2022-07-06 NOTE — Addendum Note (Signed)
Addended by: Caryl Pina on: 07/06/2022 04:07 PM   Modules accepted: Orders

## 2022-07-06 NOTE — Progress Notes (Addendum)
BP 129/82   Pulse (!) 103   Temp (!) 97.4 F (36.3 C)   Ht '5\' 7"'$  (1.702 m)   Wt 258 lb (117 kg)   SpO2 99%   BMI 40.41 kg/m    Subjective:   Patient ID: Kelly Campbell, female    DOB: 07-27-1991, 31 y.o.   MRN: [id number]  HPI: Kelly Campbell is a 31 y.o. female presenting on 07/06/2022 for Medical Management of Chronic Issues, Anxiety, Depression, and Migraine   HPI Migraine recheck Patient is coming in for migraine recheck.  She currently takes Topamax for prevention and the Maxalt for emergent or important.  She says the Maxalt does not work as well when she is at work, when she can lay down and rest it works well.  She still getting migraines a couple times a month and mostly they are frontal to temporal and sometimes one-sided sometimes both.  She denies any visual effects from it.  Relevant past medical, surgical, family and social history reviewed and updated as indicated. Interim medical history since our last visit reviewed. Allergies and medications reviewed and updated.  Review of Systems  Constitutional:  Negative for chills and fever.  Eyes:  Negative for visual disturbance.  Respiratory:  Negative for chest tightness and shortness of breath.   Cardiovascular:  Negative for chest pain and leg swelling.  Musculoskeletal:  Negative for back pain and gait problem.  Skin:  Negative for rash.  Neurological:  Positive for headaches. Negative for dizziness and light-headedness.  Psychiatric/Behavioral:  Negative for agitation and behavioral problems.   All other systems reviewed and are negative.   Per HPI unless specifically indicated above   Allergies as of 07/06/2022       Reactions   Penicillins         Medication List        Accurate as of July 06, 2022  4:07 PM. If you have any questions, ask your nurse or doctor.          albuterol 108 (90 Base) MCG/ACT inhaler Commonly known as: VENTOLIN HFA Inhale 1-2 puffs into the lungs every 6 (six)  hours as needed for wheezing or shortness of breath.   buPROPion 300 MG 24 hr tablet Commonly known as: Wellbutrin XL Take 1 tablet (300 mg total) by mouth daily.   diclofenac 75 MG EC tablet Commonly known as: VOLTAREN Take 1 tablet (75 mg total) by mouth 2 (two) times daily.   etonogestrel-ethinyl estradiol 0.12-0.015 MG/24HR vaginal ring Commonly known as: EluRyng INSERT 1 RING VAGINALLY AND LEAVE IN PLACE FOR 3 CONSECUTIVE WEEKS THEN REMOVE FOR 1 WEEK   fluticasone 50 MCG/ACT nasal spray Commonly known as: FLONASE Place 2 sprays into both nostrils daily.   hydrOXYzine 25 MG capsule Commonly known as: VISTARIL TAKE 1 CAPSULE BY MOUTH TWICE DAILY AS NEEDED   rizatriptan 10 MG tablet Commonly known as: Maxalt Take 1 tablet (10 mg total) by mouth as needed for migraine. May repeat in 2 hours if needed   Semaglutide-Weight Management 0.25 MG/0.5ML Soaj Inject 0.25 mg into the skin once a week for 28 days. Started by: Worthy Rancher, MD   Semaglutide-Weight Management 0.5 MG/0.5ML Soaj Inject 0.5 mg into the skin once a week for 28 days. Start taking on: August 04, 2022 Started by: Fransisca Kaufmann Zygmunt Mcglinn, MD   Semaglutide-Weight Management 1 MG/0.5ML Soaj Inject 1 mg into the skin once a week for 28 days. Start taking on: September 02, 2022 Started by:  Fransisca Kaufmann Buster Schueller, MD   Semaglutide-Weight Management 1.7 MG/0.75ML Soaj Inject 1.7 mg into the skin once a week for 28 days. Start taking on: October 01, 2022 Started by: Fransisca Kaufmann Flordia Kassem, MD   Semaglutide-Weight Management 2.4 MG/0.75ML Soaj Inject 2.4 mg into the skin once a week. Start taking on: October 30, 2022 Started by: Fransisca Kaufmann Isley Zinni, MD   sertraline 100 MG tablet Commonly known as: ZOLOFT TAKE 1 & 1/2 (ONE & ONE-HALF) TABLETS BY MOUTH ONCE DAILY   topiramate 100 MG tablet Commonly known as: Topamax Take 1 tablet (100 mg total) by mouth at bedtime as needed.   Vitamin D (Ergocalciferol) 1.25 MG  (50000 UNIT) Caps capsule Commonly known as: DRISDOL Take 1 capsule by mouth once a week         Objective:   BP 129/82   Pulse (!) 103   Temp (!) 97.4 F (36.3 C)   Ht '5\' 7"'$  (1.702 m)   Wt 258 lb (117 kg)   SpO2 99%   BMI 40.41 kg/m   Wt Readings from Last 3 Encounters:  07/06/22 258 lb (117 kg)  05/27/22 265 lb (120.2 kg)  04/03/22 260 lb (117.9 kg)    Physical Exam Vitals and nursing note reviewed.  Constitutional:      Appearance: Normal appearance.  Neurological:     General: No focal deficit present.     Mental Status: She is alert and oriented to person, place, and time.  Psychiatric:        Mood and Affect: Mood normal.        Behavior: Behavior normal.        Thought Content: Thought content normal.       Assessment & Plan:   Problem List Items Addressed This Visit       Cardiovascular and Mediastinum   Migraine without aura and without status migrainosus, not intractable - Primary   Other Visit Diagnoses     Morbid obesity ([place])       Relevant Medications   Semaglutide-Weight Management 0.25 MG/0.5ML SOAJ   Semaglutide-Weight Management 0.5 MG/0.5ML SOAJ (Start on 08/04/2022)   Semaglutide-Weight Management 1 MG/0.5ML SOAJ (Start on 09/02/2022)   Semaglutide-Weight Management 1.7 MG/0.75ML SOAJ (Start on 10/01/2022)   Semaglutide-Weight Management 2.4 MG/0.75ML SOAJ (Start on 10/30/2022)   Dyslipidemia           Gave samples for Nurtec that she can try instead of the Maxalt, if it does well then we can give her a prescription for Nurtec Follow up plan: Return if symptoms worsen or fail to improve.  Counseling provided for all of the vaccine components No orders of the defined types were placed in this encounter.   Caryl Pina, MD [facility] 07/06/2022, 4:07 PM

## 2022-07-07 ENCOUNTER — Ambulatory Visit: Payer: BC Managed Care – PPO | Admitting: Physical Therapy

## 2022-07-07 ENCOUNTER — Encounter: Payer: Self-pay | Admitting: Physical Therapy

## 2022-07-07 DIAGNOSIS — M25512 Pain in left shoulder: Secondary | ICD-10-CM

## 2022-07-07 DIAGNOSIS — M25511 Pain in right shoulder: Secondary | ICD-10-CM | POA: Diagnosis not present

## 2022-07-07 NOTE — Therapy (Signed)
OUTPATIENT PHYSICAL THERAPY SHOULDER TREATMENT   Patient Name: Datra Clary MRN: [id number] DOB:[birth year], 31 y.o., female Today's Date: 07/07/2022   PT End of Session - 07/07/22 1700     Visit Number 7    Number of Visits 10    Date for PT Re-Evaluation 07/09/22    Authorization Type FOTO.    PT Start Time 0400    PT Stop Time 0450    PT Time Calculation (min) 50 min    Activity Tolerance Patient tolerated treatment well    Behavior During Therapy [facility] for tasks assessed/performed             Past Medical History:  Diagnosis Date   Preeclampsia    Rheumatoid arthritis ([place])    Past Surgical History:  Procedure Laterality Date   CESAREAN SECTION     Patient Active Problem List   Diagnosis Date Noted   Migraine without aura and without status migrainosus, not intractable 07/06/2022   History of juvenile rheumatoid arthritis 02/18/2022   Swelling of toe of right foot 02/18/2022   Bilateral foot pain 02/18/2022   Vitamin D deficiency 05/21/2021   Encounter for surveillance of vaginal ring hormonal contraceptive device 02/14/2020   Anxiety and depression 11/10/2018   PTSD (post-traumatic stress disorder) 11/10/2018   Depression, recurrent ([place]) 01/15/2017    REFERRING PROVIDER: Vonna Kotyk Dettinger MD  REFERRING DIAG: Bursitis of both shoulders.  THERAPY DIAG:  Acute pain of right shoulder  Acute pain of left shoulder  Rationale for Evaluation and Treatment Rehabilitation  ONSET DATE: About a month ago.  SUBJECTIVE:                                                                                                                                                                                      SUBJECTIVE STATEMENT: Rt shoulder 3/10, left 2/10. PERTINENT HISTORY: RA.  PAIN:  See above. OBJECTIVE:   PATIENT SURVEYS:  FOTO    Modalities   UBE at 30 RPM's x 6 minutes f/b Combo e'stim/US at 1.50 W/CM2 x 9 minutes to right shoulder  f/b STW/M including  IASTM x 8 minutes to patient's right shoulder f/b Pre-mod e'stim at 80-150 Hz x 20 minutes to each shoulder.   ASSESSMENT:  CLINICAL IMPRESSION:  Patient progressing well.  Less palpable pain over bilateral middle deltoids.   OBJECTIVE IMPAIRMENTS decreased activity tolerance and pain.    GOALS:   LONG TERM GOALS: Target date: 08/11/2022  (Remove Blue Hyperlink)  Ind with an HEP. Baseline:  Goal status: INITIAL  2.  Sleep undisturbed 6 hours. Baseline:  Goal status: INITIAL  3.  Perform ADL's with pain not > 2-3/10. Baseline:  Goal status: INITIAL   PLAN: PT FREQUENCY: 2x/week  PT DURATION: other: 5 weeks.  PLANNED INTERVENTIONS: Therapeutic exercises, Therapeutic activity, Patient/Family education, Dry Needling, Electrical stimulation, Cryotherapy, Moist heat, Vasopneumatic device, Ultrasound, and Manual therapy  PLAN FOR NEXT SESSION: Combo e'stim/US, STW/M, RW4.   Abass Misener, [place], PT 07/07/2022, 5:02 PM

## 2022-07-09 ENCOUNTER — Ambulatory Visit: Payer: BC Managed Care – PPO | Admitting: *Deleted

## 2022-07-09 DIAGNOSIS — M25511 Pain in right shoulder: Secondary | ICD-10-CM | POA: Diagnosis not present

## 2022-07-09 DIAGNOSIS — M25512 Pain in left shoulder: Secondary | ICD-10-CM

## 2022-07-09 NOTE — Therapy (Signed)
OUTPATIENT PHYSICAL THERAPY SHOULDER TREATMENT   Patient Name: Kelly Campbell MRN: [id number] DOB:Apr 16, 1991, 31 y.o., female Today's Date: 07/09/2022   PT End of Session - 07/09/22 1607     Visit Number 8    Number of Visits 10    Authorization Type FOTO.    PT Start Time 1600    PT Stop Time 1650    PT Time Calculation (min) 50 min    Equipment Utilized During Treatment Oxygen             Past Medical History:  Diagnosis Date   Preeclampsia    Rheumatoid arthritis ([place])    Past Surgical History:  Procedure Laterality Date   CESAREAN SECTION     Patient Active Problem List   Diagnosis Date Noted   Migraine without aura and without status migrainosus, not intractable 07/06/2022   History of juvenile rheumatoid arthritis 02/18/2022   Swelling of toe of right foot 02/18/2022   Bilateral foot pain 02/18/2022   Vitamin D deficiency 05/21/2021   Encounter for surveillance of vaginal ring hormonal contraceptive device 02/14/2020   Anxiety and depression 11/10/2018   PTSD (post-traumatic stress disorder) 11/10/2018   Depression, recurrent ([place]) 01/15/2017    REFERRING PROVIDER: Vonna Kotyk Dettinger MD  REFERRING DIAG: Bursitis of both shoulders.  THERAPY DIAG:  Acute pain of right shoulder  Acute pain of left shoulder  Rationale for Evaluation and Treatment Rehabilitation  ONSET DATE: About a month ago.  SUBJECTIVE:                                                                                                                                                                                      SUBJECTIVE STATEMENT:  Doing about the same. Rt shoulder 3/10, left shldr 2/10. Not feeling well do to migraine H/A PERTINENT HISTORY: RA.  PAIN:  See above. OBJECTIVE:   PATIENT SURVEYS:  FOTO                                    Today's Treatment     07-09-22   UBE at 31 RPM's x 8 minutes f/b Combo e'stim/US at 1.50 W/CM2 x 8 mins each shldr (16 total)   f/b STW/M  including IASTM x 6 minutes to patient's right shoulder and LT shldr  f/b Pre-mod e'stim at 80-150 Hz x 20 minutes to each shoulder. Discussed and demonstrated performing scapular retractions and being aware of her shldr blade position while at work to decrease anterior tilt of shldr blade.  ASSESSMENT:  CLINICAL IMPRESSION:  Patient not feeling well today  due to  migraine. Rx focused on therex as well as modalities for both sldrs. Discussed posture and performing scapular retractions and being aware of what position her scapula is in while at work and to try to avoid anterior tilting of scapula.   OBJECTIVE IMPAIRMENTS decreased activity tolerance and pain.    GOALS:   LONG TERM GOALS: Target date: 08/13/2022  (Remove Blue Hyperlink)  Ind with an HEP. Baseline:  Goal status: INITIAL  2.  Sleep undisturbed 6 hours. Baseline:  Goal status: INITIAL  3.  Perform ADL's with pain not > 2-3/10. Baseline:  Goal status: INITIAL   PLAN: PT FREQUENCY: 2x/week  PT DURATION: other: 5 weeks.  PLANNED INTERVENTIONS: Therapeutic exercises, Therapeutic activity, Patient/Family education, Dry Needling, Electrical stimulation, Cryotherapy, Moist heat, Vasopneumatic device, Ultrasound, and Manual therapy  PLAN FOR NEXT SESSION: Combo e'stim/US, STW/M, RW4.   Denard Tuminello,CHRIS, PTA 07/09/2022, 5:08 PM

## 2022-07-14 ENCOUNTER — Encounter: Payer: Self-pay | Admitting: Family Medicine

## 2022-07-14 ENCOUNTER — Ambulatory Visit: Payer: BC Managed Care – PPO | Attending: Family Medicine | Admitting: Physical Therapy

## 2022-07-14 ENCOUNTER — Encounter: Payer: Self-pay | Admitting: Physical Therapy

## 2022-07-14 DIAGNOSIS — M25511 Pain in right shoulder: Secondary | ICD-10-CM | POA: Insufficient documentation

## 2022-07-14 DIAGNOSIS — M25512 Pain in left shoulder: Secondary | ICD-10-CM | POA: Diagnosis not present

## 2022-07-14 NOTE — Therapy (Signed)
OUTPATIENT PHYSICAL THERAPY SHOULDER TREATMENT   Patient Name: Kelly Campbell MRN: [id number] DOB:May 18, 1991, 31 y.o., female Today's Date: 07/14/2022   PT End of Session - 07/14/22 1756     Visit Number 9    Number of Visits 10    PT Start Time 0444    PT Stop Time 0534    PT Time Calculation (min) 50 min    Activity Tolerance Patient tolerated treatment well    Behavior During Therapy [facility] for tasks assessed/performed              Past Medical History:  Diagnosis Date   Preeclampsia    Rheumatoid arthritis ([place])    Past Surgical History:  Procedure Laterality Date   CESAREAN SECTION     Patient Active Problem List   Diagnosis Date Noted   Migraine without aura and without status migrainosus, not intractable 07/06/2022   History of juvenile rheumatoid arthritis 02/18/2022   Swelling of toe of right foot 02/18/2022   Bilateral foot pain 02/18/2022   Vitamin D deficiency 05/21/2021   Encounter for surveillance of vaginal ring hormonal contraceptive device 02/14/2020   Anxiety and depression 11/10/2018   PTSD (post-traumatic stress disorder) 11/10/2018   Depression, recurrent ([place]) 01/15/2017    REFERRING PROVIDER: Vonna Kotyk Dettinger MD  REFERRING DIAG: Bursitis of both shoulders.  THERAPY DIAG:  Acute pain of right shoulder  Rationale for Evaluation and Treatment Rehabilitation  ONSET DATE: About a month ago.  SUBJECTIVE:                                                                                                                                                                                      SUBJECTIVE STATEMENT: Flu shot in left shoulder today. PERTINENT HISTORY: RA.  PAIN:  Right shoulder 3/30. OBJECTIVE:                                    Today's Treatment     07/14/22   UBE at 90 RPM's x 8 minutes f/b Combo e'stim/US at 1.50 W/CM2 x 10 mins to right shoulder.   f/b STW/M including IASTM x 5 minutes to patient's right shoulder and LT  shldr  f/b IFC at 80-150 Hz on 40% scan x 20 minutes.e'stim. Discussed and demonstrated performing scapular retractions and being aware of her shldr blade position while at work to decrease anterior tilt of shldr blade.  ASSESSMENT:  CLINICAL IMPRESSION:  Pain in right shoulder has been consistently lowered.  Very good response to tx and less palpable pain over her right middle deltoid.  OBJECTIVE IMPAIRMENTS decreased  activity tolerance and pain.    GOALS:   LONG TERM GOALS: Target date: 08/18/2022  (Remove Blue Hyperlink)  Ind with an HEP. Baseline:  Goal status: INITIAL  2.  Sleep undisturbed 6 hours. Baseline:  Goal status: INITIAL  3.  Perform ADL's with pain not > 2-3/10. Baseline:  Goal status: INITIAL   PLAN: PT FREQUENCY: 2x/week  PT DURATION: other: 5 weeks.  PLANNED INTERVENTIONS: Therapeutic exercises, Therapeutic activity, Patient/Family education, Dry Needling, Electrical stimulation, Cryotherapy, Moist heat, Vasopneumatic device, Ultrasound, and Manual therapy  PLAN FOR NEXT SESSION: Combo e'stim/US, STW/M, RW4.   Shakari Qazi, [place], PT 07/14/2022, 5:57 PM

## 2022-07-16 ENCOUNTER — Ambulatory Visit: Payer: BC Managed Care – PPO | Admitting: Physical Therapy

## 2022-07-16 ENCOUNTER — Encounter: Payer: Self-pay | Admitting: Physical Therapy

## 2022-07-16 DIAGNOSIS — M25512 Pain in left shoulder: Secondary | ICD-10-CM | POA: Diagnosis not present

## 2022-07-16 DIAGNOSIS — M25511 Pain in right shoulder: Secondary | ICD-10-CM | POA: Diagnosis not present

## 2022-07-16 NOTE — Therapy (Signed)
OUTPATIENT PHYSICAL THERAPY SHOULDER TREATMENT   Patient Name: Kelly Campbell MRN: [id number] DOB:[birth year], 31 y.o., female Today's Date: 07/16/2022   PT End of Session - 07/16/22 1623     Visit Number 10    Number of Visits 10    PT Start Time 0352    PT Stop Time 0440    PT Time Calculation (min) 48 min    Activity Tolerance Patient tolerated treatment well    Behavior During Therapy [facility] for tasks assessed/performed              Past Medical History:  Diagnosis Date   Preeclampsia    Rheumatoid arthritis ([place])    Past Surgical History:  Procedure Laterality Date   CESAREAN SECTION     Patient Active Problem List   Diagnosis Date Noted   Migraine without aura and without status migrainosus, not intractable 07/06/2022   History of juvenile rheumatoid arthritis 02/18/2022   Swelling of toe of right foot 02/18/2022   Bilateral foot pain 02/18/2022   Vitamin D deficiency 05/21/2021   Encounter for surveillance of vaginal ring hormonal contraceptive device 02/14/2020   Anxiety and depression 11/10/2018   PTSD (post-traumatic stress disorder) 11/10/2018   Depression, recurrent ([place]) 01/15/2017    REFERRING PROVIDER: Vonna Kotyk Dettinger MD  REFERRING DIAG: Bursitis of both shoulders.  THERAPY DIAG:  Acute pain of right shoulder  Acute pain of left shoulder  Rationale for Evaluation and Treatment Rehabilitation  ONSET DATE: About a month ago.  SUBJECTIVE:                                                                                                                                                                                      SUBJECTIVE STATEMENT: Flu shot in left shoulder today. PERTINENT HISTORY: RA.  PAIN:  Right shoulder 2/10. OBJECTIVE:                                    Today's Treatment     07/16/22   UBE at 90 RPM's x 10 minutes f/b Combo e'stim/US at 1.50 W/CM2 x  8 mins to right shoulder.   f/b STW/M  x 5 minutes to patient's right  shoulder and LT shldr  f/b IFC at 80-150 Hz on 40% scan x 20 minutes.e'stim. Discussed and demonstrated performing scapular retractions and being aware of her shldr blade position while at work to decrease anterior tilt of shldr blade.  ASSESSMENT:  CLINICAL IMPRESSION:  Pain in right shoulder has been consistently lowered.  Very good response to tx and less palpable pain over her right  middle deltoid.  OBJECTIVE IMPAIRMENTS decreased activity tolerance and pain.    GOALS:   LONG TERM GOALS: Target date: 08/20/2022  (Remove Blue Hyperlink)  Ind with an HEP. Baseline:  Goal status: MET  2.  Sleep undisturbed 6 hours. Baseline:  Goal status: Much better sleep with less pain.  3.  Perform ADL's with pain not > 2-3/10. Baseline:  Goal status: today, 2/10.   PLAN: PT FREQUENCY: 2x/week  PT DURATION: other: 5 weeks.  PLANNED INTERVENTIONS: Therapeutic exercises, Therapeutic activity, Patient/Family education, Dry Needling, Electrical stimulation, Cryotherapy, Moist heat, Vasopneumatic device, Ultrasound, and Manual therapy  PLAN FOR NEXT SESSION: Combo e'stim/US, STW/M, RW4.  PHYSICAL THERAPY DISCHARGE SUMMARY  Visits from Start of Care: 10  Current functional level related to goals / functional outcomes: See above.   Remaining deficits: Minimal right shoulder pain.   Education / Equipment: HEP.   Patient agrees to discharge. Patient goals were partially met. Patient is being discharged due to being pleased with the current functional level.   Hudsyn Champine, [place], PT 07/16/2022, 4:48 PM

## 2022-07-17 NOTE — Telephone Encounter (Signed)
Patient called to check on the status of her PA. Please call.

## 2022-08-04 ENCOUNTER — Other Ambulatory Visit: Payer: Self-pay | Admitting: *Deleted

## 2022-10-13 ENCOUNTER — Ambulatory Visit: Payer: BC Managed Care – PPO | Admitting: Family Medicine

## 2022-10-13 ENCOUNTER — Encounter: Payer: Self-pay | Admitting: Family Medicine

## 2022-10-13 VITALS — BP 141/78 | Temp 97.7°F | Ht 67.0 in | Wt 263.6 lb

## 2022-10-13 DIAGNOSIS — J069 Acute upper respiratory infection, unspecified: Secondary | ICD-10-CM

## 2022-10-13 MED ORDER — OSELTAMIVIR PHOSPHATE 75 MG PO CAPS: 75.0000 mg | ORAL_CAPSULE | Freq: Two times a day (BID) | ORAL | 0 refills | Status: DC

## 2022-10-13 NOTE — Progress Notes (Signed)
Subjective:  Patient ID: Kelly Campbell, female    DOB: [birth year]  Age: 32 y.o. MRN: [id number]  CC: Cough and Nasal Congestion   HPI Kelly Campbell presents for dry cough. Bad sore throat. Onset yesterday morning. Body aches. No fever. Home Covid was negative.      10/13/2022    3:02 PM 10/13/2022    2:45 PM 07/06/2022    2:28 PM  Depression screen PHQ 2/9  Decreased Interest 1 0 1  Down, Depressed, Hopeless 1 0 1  PHQ - 2 Score 2 0 2  Altered sleeping 3  1  Tired, decreased energy 3  2  Change in appetite 1  1  Feeling bad or failure about yourself  1  1  Trouble concentrating 2  2  Moving slowly or fidgety/restless 0  1  Suicidal thoughts 0  0  PHQ-9 Score 12  10  Difficult doing work/chores Very difficult  Very difficult    History Kelly Campbell has a past medical history of Preeclampsia and Rheumatoid arthritis (Max).   Kelly Campbell has a past surgical history that includes Cesarean section.   Kelly Campbell family history includes Asthma in Kelly Campbell mother and sister; Diabetes in Kelly Campbell mother; Kidney disease in Kelly Campbell mother; Neuropathy in Kelly Campbell mother; Rheum arthritis in Kelly Campbell maternal grandmother.Kelly Campbell that Kelly Campbell quit smoking about 14 months ago. Kelly Campbell smoking use included cigarettes. Kelly Campbell has a 2.50 pack-year smoking history. Kelly Campbell has never been exposed to tobacco smoke. Kelly Campbell has never used smokeless tobacco. Kelly Campbell that Kelly Campbell does not drink alcohol and does not use drugs.    ROS Review of Systems  Constitutional:  Positive for appetite change, chills and fever.  HENT:  Positive for congestion and rhinorrhea. Negative for ear pain, nosebleeds, postnasal drip, sinus pressure and sore throat.   Respiratory:  Negative for chest tightness and shortness of breath.   Cardiovascular:  Negative for chest pain.  Musculoskeletal:  Positive for myalgias.  Skin:  Negative for rash.  Neurological:  Positive for headaches.    Objective:  BP (!) 141/78   Temp 97.7 F (36.5 C)   Ht '5\' 7"'$  (1.702 m)   Wt 263  lb 9.6 oz (119.6 kg)   SpO2 98%   BMI 41.29 kg/m   BP Readings from Last 3 Encounters:  10/13/22 (!) 141/78  07/06/22 129/82  05/27/22 126/85    Wt Readings from Last 3 Encounters:  10/13/22 263 lb 9.6 oz (119.6 kg)  07/06/22 258 lb (117 kg)  05/27/22 265 lb (120.2 kg)     Physical Exam Constitutional:      Appearance: Kelly Campbell is well-developed.  HENT:     Head: Normocephalic and atraumatic.     Right Ear: Tympanic membrane and external ear normal. No decreased hearing noted.     Left Ear: Tympanic membrane and external ear normal. No decreased hearing noted.     Nose: Mucosal edema present.     Right Sinus: No frontal sinus tenderness.     Left Sinus: No frontal sinus tenderness.     Mouth/Throat:     Pharynx: No oropharyngeal exudate or posterior oropharyngeal erythema.  Neck:     Meningeal: Brudzinski's sign absent.  Pulmonary:     Effort: No respiratory distress.     Breath sounds: Normal breath sounds.  Lymphadenopathy:     Head:     Right side of head: No preauricular adenopathy.     Left side of head: No preauricular adenopathy.     Cervical:  Right cervical: No superficial cervical adenopathy.    Left cervical: No superficial cervical adenopathy.       Assessment & Plan:   Kelly Campbell was seen today for cough and nasal congestion.  Diagnoses and all orders for this visit:  Upper respiratory infection with cough and congestion -     COVID-19, Flu A+B and RSV  Other orders -     oseltamivir (TAMIFLU) 75 MG capsule; Take 1 capsule (75 mg total) by mouth 2 (two) times daily.       I am having Kelly Campbell start on oseltamivir. I am also having Kelly Campbell maintain Kelly Campbell fluticasone, rizatriptan, etonogestrel-ethinyl estradiol, albuterol, buPROPion, diclofenac, hydrOXYzine, sertraline, topiramate, Vitamin D (Ergocalciferol), Semaglutide-Weight Management, and Semaglutide-Weight Management.  Allergies as of 10/13/2022       Reactions   Penicillins          Medication List        Accurate as of October 13, 2022  3:12 PM. If you have any questions, ask your nurse or doctor.          albuterol 108 (90 Base) MCG/ACT inhaler Commonly known as: VENTOLIN HFA Inhale 1-2 puffs into the lungs every 6 (six) hours as needed for wheezing or shortness of breath.   buPROPion 300 MG 24 hr tablet Commonly known as: Wellbutrin XL Take 1 tablet (300 mg total) by mouth daily.   diclofenac 75 MG EC tablet Commonly known as: VOLTAREN Take 1 tablet (75 mg total) by mouth 2 (two) times daily.   etonogestrel-ethinyl estradiol 0.12-0.015 MG/24HR vaginal ring Commonly known as: EluRyng INSERT 1 RING VAGINALLY AND LEAVE IN PLACE FOR 3 CONSECUTIVE WEEKS THEN REMOVE FOR 1 WEEK   fluticasone 50 MCG/ACT nasal spray Commonly known as: FLONASE Place 2 sprays into both nostrils daily.   hydrOXYzine 25 MG capsule Commonly known as: VISTARIL TAKE 1 CAPSULE BY MOUTH TWICE DAILY AS NEEDED   oseltamivir 75 MG capsule Commonly known as: Tamiflu Take 1 capsule (75 mg total) by mouth 2 (two) times daily. Started by: Claretta Fraise, MD   rizatriptan 10 MG tablet Commonly known as: Maxalt Take 1 tablet (10 mg total) by mouth as needed for migraine. May repeat in 2 hours if needed   Semaglutide-Weight Management 1.7 MG/0.75ML Soaj Inject 1.7 mg into the skin once a week for 28 days.   Semaglutide-Weight Management 2.4 MG/0.75ML Soaj Inject 2.4 mg into the skin once a week. Start taking on: October 30, 2022   sertraline 100 MG tablet Commonly known as: ZOLOFT TAKE 1 & 1/2 (ONE & ONE-HALF) TABLETS BY MOUTH ONCE DAILY   topiramate 100 MG tablet Commonly known as: Topamax Take 1 tablet (100 mg total) by mouth at bedtime as needed.   Vitamin D (Ergocalciferol) 1.25 MG (50000 UNIT) Caps capsule Commonly known as: DRISDOL Take 1 capsule by mouth once a week         Follow-up: No follow-ups on file.  Claretta Fraise, M.D.

## 2022-10-14 DIAGNOSIS — R070 Pain in throat: Secondary | ICD-10-CM | POA: Diagnosis not present

## 2022-10-14 LAB — COVID-19, FLU A+B AND RSV
Influenza A, NAA: NOT DETECTED
Influenza B, NAA: NOT DETECTED
RSV, NAA: NOT DETECTED
SARS-CoV-2, NAA: NOT DETECTED

## 2022-10-15 ENCOUNTER — Ambulatory Visit: Payer: BC Managed Care – PPO | Admitting: Family Medicine

## 2022-10-15 ENCOUNTER — Encounter: Payer: Self-pay | Admitting: Family Medicine

## 2022-10-15 VITALS — BP 106/75 | HR 103 | Temp 97.8°F | Ht 67.0 in | Wt 261.0 lb

## 2022-10-15 DIAGNOSIS — F419 Anxiety disorder, unspecified: Secondary | ICD-10-CM | POA: Diagnosis not present

## 2022-10-15 DIAGNOSIS — F4310 Post-traumatic stress disorder, unspecified: Secondary | ICD-10-CM

## 2022-10-15 DIAGNOSIS — G43009 Migraine without aura, not intractable, without status migrainosus: Secondary | ICD-10-CM

## 2022-10-15 DIAGNOSIS — F339 Major depressive disorder, recurrent, unspecified: Secondary | ICD-10-CM

## 2022-10-15 MED ORDER — TOPIRAMATE 100 MG PO TABS: 100.0000 mg | ORAL_TABLET | Freq: Every evening | ORAL | 1 refills | Status: DC | PRN

## 2022-10-15 MED ORDER — SERTRALINE HCL 100 MG PO TABS: ORAL_TABLET | ORAL | 1 refills | Status: DC

## 2022-10-15 MED ORDER — BUPROPION HCL ER (XL) 300 MG PO TB24: 300.0000 mg | ORAL_TABLET | Freq: Every day | ORAL | 1 refills | Status: DC

## 2022-10-15 MED ORDER — VITAMIN D (ERGOCALCIFEROL) 1.25 MG (50000 UNIT) PO CAPS: 50000.0000 [IU] | ORAL_CAPSULE | ORAL | 3 refills | Status: DC

## 2022-10-15 NOTE — Progress Notes (Signed)
BP 106/75   Pulse (!) 103   Temp 97.8 F (36.6 C)   Ht '5\' 7"'$  (1.702 m)   Wt 261 lb (118.4 kg)   SpO2 97%   BMI 40.88 kg/m    Subjective:   Patient ID: Kelly Campbell, female    DOB: 09-01-1991, 32 y.o.   MRN: [id number]  HPI: Kelly Campbell is a 32 y.o. female presenting on 10/15/2022 for Medical Management of Chronic Issues, Migraine (/), and Depression   HPI Anxiety depression recheck Patient is coming in today for anxiety depression recheck.  She feels like she is doing okay with her anxiety and depression.  She does feel like it has built up a little more recently.  She has been using the hydroxyzine a little more frequently, about twice a week she has gotten overwhelmed with anxiety but it is still better than she had been a lot before and she is content with using the hydroxyzine on the days but needs.  She is having a little more social anxiety when she goes out to the store and other social situations.  She prefers not to change her medication at this point because she feels like it is helping and we will just use the hydroxyzine little more frequently when she needs it.    10/15/2022    3:31 PM 10/13/2022    3:02 PM 10/13/2022    2:45 PM 07/06/2022    2:28 PM 05/27/2022    8:23 AM  Depression screen PHQ 2/9  Decreased Interest 1 1 0 1 1  Down, Depressed, Hopeless 1 1 0 1 1  PHQ - 2 Score 2 2 0 2 2  Altered sleeping '2 3  1 3  '$ Tired, decreased energy '3 3  2 3  '$ Change in appetite '1 1  1 '$ 0  Feeling bad or failure about yourself  0 '1  1 1  '$ Trouble concentrating '1 2  2 1  '$ Moving slowly or fidgety/restless 0 0  1 1  Suicidal thoughts 0 0  0 0  PHQ-9 Score '9 12  10 11  '$ Difficult doing work/chores Very difficult Very difficult  Very difficult Very difficult     Relevant past medical, surgical, family and social history reviewed and updated as indicated. Interim medical history since our last visit reviewed. Allergies and medications reviewed and updated.  Review of Systems   Constitutional:  Negative for chills and fever.  Eyes:  Negative for visual disturbance.  Respiratory:  Negative for chest tightness and shortness of breath.   Cardiovascular:  Negative for chest pain and leg swelling.  Skin:  Negative for rash.  Neurological:  Negative for dizziness, light-headedness and headaches.  Psychiatric/Behavioral:  Positive for decreased concentration. Negative for agitation, behavioral problems, self-injury, sleep disturbance and suicidal ideas. The patient is nervous/anxious.   All other systems reviewed and are negative.   Per HPI unless specifically indicated above   Allergies as of 10/15/2022       Reactions   Penicillins         Medication List        Accurate as of October 15, 2022  4:01 PM. If you have any questions, ask your nurse or doctor.          STOP taking these medications    oseltamivir 75 MG capsule Commonly known as: Tamiflu Stopped by: Worthy Rancher, MD   Semaglutide-Weight Management 1.7 MG/0.75ML Soaj Stopped by: Worthy Rancher, MD   Semaglutide-Weight Management 2.4 MG/0.75ML  Soaj Stopped by: Worthy Rancher, MD       TAKE these medications    albuterol 108 (90 Base) MCG/ACT inhaler Commonly known as: VENTOLIN HFA Inhale 1-2 puffs into the lungs every 6 (six) hours as needed for wheezing or shortness of breath.   buPROPion 300 MG 24 hr tablet Commonly known as: Wellbutrin XL Take 1 tablet (300 mg total) by mouth daily.   diclofenac 75 MG EC tablet Commonly known as: VOLTAREN Take 1 tablet (75 mg total) by mouth 2 (two) times daily.   etonogestrel-ethinyl estradiol 0.12-0.015 MG/24HR vaginal ring Commonly known as: EluRyng INSERT 1 RING VAGINALLY AND LEAVE IN PLACE FOR 3 CONSECUTIVE WEEKS THEN REMOVE FOR 1 WEEK   fluticasone 50 MCG/ACT nasal spray Commonly known as: FLONASE Place 2 sprays into both nostrils daily.   hydrOXYzine 25 MG capsule Commonly known as: VISTARIL TAKE 1 CAPSULE BY  MOUTH TWICE DAILY AS NEEDED   rizatriptan 10 MG tablet Commonly known as: Maxalt Take 1 tablet (10 mg total) by mouth as needed for migraine. May repeat in 2 hours if needed   sertraline 100 MG tablet Commonly known as: ZOLOFT TAKE 1 & 1/2 (ONE & ONE-HALF) TABLETS BY MOUTH ONCE DAILY   topiramate 100 MG tablet Commonly known as: Topamax Take 1 tablet (100 mg total) by mouth at bedtime as needed.   Vitamin D (Ergocalciferol) 1.25 MG (50000 UNIT) Caps capsule Commonly known as: DRISDOL Take 1 capsule (50,000 Units total) by mouth once a week.         Objective:   BP 106/75   Pulse (!) 103   Temp 97.8 F (36.6 C)   Ht '5\' 7"'$  (1.702 m)   Wt 261 lb (118.4 kg)   SpO2 97%   BMI 40.88 kg/m   Wt Readings from Last 3 Encounters:  10/15/22 261 lb (118.4 kg)  10/13/22 263 lb 9.6 oz (119.6 kg)  07/06/22 258 lb (117 kg)    Physical Exam Vitals and nursing note reviewed.  Constitutional:      General: She is not in acute distress.    Appearance: She is well-developed. She is not diaphoretic.  Eyes:     Conjunctiva/sclera: Conjunctivae normal.  Cardiovascular:     Rate and Rhythm: Normal rate and regular rhythm.     Heart sounds: Normal heart sounds. No murmur heard. Pulmonary:     Effort: Pulmonary effort is normal. No respiratory distress.     Breath sounds: Normal breath sounds. No wheezing.  Abdominal:     General: Abdomen is flat. Bowel sounds are normal. There is no distension.     Tenderness: There is no abdominal tenderness. There is no guarding or rebound.  Musculoskeletal:        General: No swelling or tenderness. Normal range of motion.  Skin:    General: Skin is warm and dry.     Findings: No rash.  Neurological:     Mental Status: She is alert and oriented to person, place, and time.     Coordination: Coordination normal.  Psychiatric:        Behavior: Behavior normal.       Assessment & Plan:   Problem List Items Addressed This Visit        Cardiovascular and Mediastinum   Migraine without aura and without status migrainosus, not intractable   Relevant Medications   sertraline (ZOLOFT) 100 MG tablet   topiramate (TOPAMAX) 100 MG tablet   buPROPion (WELLBUTRIN XL) 300 MG 24  hr tablet     Other   Depression, recurrent (HCC) - Primary   Relevant Medications   sertraline (ZOLOFT) 100 MG tablet   buPROPion (WELLBUTRIN XL) 300 MG 24 hr tablet   Anxiety and depression   Relevant Medications   sertraline (ZOLOFT) 100 MG tablet   buPROPion (WELLBUTRIN XL) 300 MG 24 hr tablet   PTSD (post-traumatic stress disorder)   Relevant Medications   sertraline (ZOLOFT) 100 MG tablet   buPROPion (WELLBUTRIN XL) 300 MG 24 hr tablet    Continue current medicine for now but discussed the possibility of doing Abilify or Zyprexa in the future, but at this point she does not want to transition to 1 of those because she is doing okay and will just use the hydroxyzine more frequently as needed. Follow up plan: Return in about 3 months (around 01/14/2023), or if symptoms worsen or fail to improve, for Anxiety depression recheck.  Counseling provided for all of the vaccine components No orders of the defined types were placed in this encounter.   Caryl Pina, MD [facility] 10/15/2022, 4:01 PM

## 2022-11-02 ENCOUNTER — Encounter: Payer: Self-pay | Admitting: Family Medicine

## 2022-12-07 ENCOUNTER — Encounter: Payer: Self-pay | Admitting: Family Medicine

## 2022-12-07 ENCOUNTER — Ambulatory Visit: Payer: BC Managed Care – PPO | Admitting: Family Medicine

## 2022-12-07 VITALS — BP 121/88 | HR 90 | Ht 67.0 in | Wt 266.0 lb

## 2022-12-07 DIAGNOSIS — G43009 Migraine without aura, not intractable, without status migrainosus: Secondary | ICD-10-CM | POA: Diagnosis not present

## 2022-12-07 DIAGNOSIS — F339 Major depressive disorder, recurrent, unspecified: Secondary | ICD-10-CM

## 2022-12-07 DIAGNOSIS — F419 Anxiety disorder, unspecified: Secondary | ICD-10-CM

## 2022-12-07 DIAGNOSIS — R21 Rash and other nonspecific skin eruption: Secondary | ICD-10-CM

## 2022-12-07 DIAGNOSIS — F4310 Post-traumatic stress disorder, unspecified: Secondary | ICD-10-CM | POA: Diagnosis not present

## 2022-12-07 MED ORDER — NURTEC 75 MG PO TBDP: 1.0000 | ORAL_TABLET | Freq: Every day | ORAL | 2 refills | Status: DC | PRN

## 2022-12-07 NOTE — Progress Notes (Signed)
BP 121/88   Pulse 90   Ht '5\' 7"'$  (1.702 m)   Wt 266 lb (120.7 kg)   SpO2 97%   BMI 41.66 kg/m    Subjective:   Patient ID: Kelly Campbell, female    DOB: [birth year], 32 y.o.   MRN: [id number]  HPI: Kelly Campbell is a 32 y.o. female presenting on 12/07/2022 for requesting mental health referral and Rash (Left lower extremity. Red, enlarging and sore. Present for months.)   HPI Anxiety and depression and mental health Patient is coming in today for anxiety depression and mental health issues.  She feels like things are not getting better with anxiety and depression.  She is currently taking Wellbutrin and Zoloft and they do help but she wants a more extensive evaluation and diagnosis along with counseling.  Will place referral for her.  She also has a rash on her left lower extremity that started a smaller spot a few months ago and recommended that she use topical moisturizers and eczema cream and she feels like it is not gotten better and its worsened and is about doubled in size from what it was.  She denies any pain with it but does feel some irritation there.  Patient uses sample for Nurtec for migraines and she felt like it did not do better than the Maxalt wants to see if her insurance will cover a prescription for it.  Relevant past medical, surgical, family and social history reviewed and updated as indicated. Interim medical history since our last visit reviewed. Allergies and medications reviewed and updated.  Review of Systems  Constitutional:  Negative for chills and fever.  Eyes:  Negative for visual disturbance.  Respiratory:  Negative for chest tightness and shortness of breath.   Cardiovascular:  Negative for chest pain and leg swelling.  Musculoskeletal:  Negative for back pain and gait problem.  Skin:  Positive for rash.  Neurological:  Positive for headaches. Negative for dizziness and light-headedness.  Psychiatric/Behavioral:  Positive for dysphoric mood and  sleep disturbance. Negative for agitation, behavioral problems, self-injury and suicidal ideas. The patient is nervous/anxious.   All other systems reviewed and are negative.   Per HPI unless specifically indicated above   Allergies as of 12/07/2022       Reactions   Penicillins         Medication List        Accurate as of December 07, 2022  4:48 PM. If you have any questions, ask your nurse or doctor.          albuterol 108 (90 Base) MCG/ACT inhaler Commonly known as: VENTOLIN HFA Inhale 1-2 puffs into the lungs every 6 (six) hours as needed for wheezing or shortness of breath.   buPROPion 300 MG 24 hr tablet Commonly known as: Wellbutrin XL Take 1 tablet (300 mg total) by mouth daily.   diclofenac 75 MG EC tablet Commonly known as: VOLTAREN Take 1 tablet (75 mg total) by mouth 2 (two) times daily.   etonogestrel-ethinyl estradiol 0.12-0.015 MG/24HR vaginal ring Commonly known as: EluRyng INSERT 1 RING VAGINALLY AND LEAVE IN PLACE FOR 3 CONSECUTIVE WEEKS THEN REMOVE FOR 1 WEEK   fluticasone 50 MCG/ACT nasal spray Commonly known as: FLONASE Place 2 sprays into both nostrils daily.   hydrOXYzine 25 MG capsule Commonly known as: VISTARIL TAKE 1 CAPSULE BY MOUTH TWICE DAILY AS NEEDED   Nurtec 75 MG Tbdp Generic drug: Rimegepant Sulfate Take 1 tablet (75 mg total) by mouth daily as  needed. Started by: Worthy Rancher, MD   rizatriptan 10 MG tablet Commonly known as: Maxalt Take 1 tablet (10 mg total) by mouth as needed for migraine. May repeat in 2 hours if needed   sertraline 100 MG tablet Commonly known as: ZOLOFT TAKE 1 & 1/2 (ONE & ONE-HALF) TABLETS BY MOUTH ONCE DAILY   topiramate 100 MG tablet Commonly known as: Topamax Take 1 tablet (100 mg total) by mouth at bedtime as needed.   Vitamin D (Ergocalciferol) 1.25 MG (50000 UNIT) Caps capsule Commonly known as: DRISDOL Take 1 capsule (50,000 Units total) by mouth once a week.          Objective:   BP 121/88   Pulse 90   Ht '5\' 7"'$  (1.702 m)   Wt 266 lb (120.7 kg)   SpO2 97%   BMI 41.66 kg/m   Wt Readings from Last 3 Encounters:  12/07/22 266 lb (120.7 kg)  10/15/22 261 lb (118.4 kg)  10/13/22 263 lb 9.6 oz (119.6 kg)    Physical Exam Vitals and nursing note reviewed.  Constitutional:      General: She is not in acute distress.    Appearance: She is well-developed. She is not diaphoretic.  Eyes:     Conjunctiva/sclera: Conjunctivae normal.  Musculoskeletal:        General: No tenderness. Normal range of motion.  Skin:    General: Skin is warm and dry.     Findings: Rash (Left lower extremity, slightly scaly patch about doubled in size, 8 cm x 5 cm) present.  Neurological:     Mental Status: She is alert and oriented to person, place, and time.     Coordination: Coordination normal.  Psychiatric:        Mood and Affect: Mood is anxious and depressed.        Behavior: Behavior normal.        Thought Content: Thought content does not include suicidal ideation. Thought content does not include suicidal plan.       Assessment & Plan:   Problem List Items Addressed This Visit       Cardiovascular and Mediastinum   Migraine without aura and without status migrainosus, not intractable - Primary   Relevant Medications   Rimegepant Sulfate (NURTEC) 75 MG TBDP     Other   Depression, recurrent ([place])   Relevant Orders   Ambulatory referral to Psychiatry   Anxiety and depression   Relevant Orders   Ambulatory referral to Psychiatry   PTSD (post-traumatic stress disorder)   Relevant Orders   Ambulatory referral to Psychiatry   Other Visit Diagnoses     Rash       Left lateral lower extremity rash, spreading, eczema cream not helping, place referral to dermatology   Relevant Orders   Ambulatory referral to Dermatology       Will refer to dermatology for the rash and psychiatry for anxiety and depression. Follow up plan: Return in about 3  months (around 03/07/2023), or if symptoms worsen or fail to improve, for Anxiety depression and rash follow-up.  Counseling provided for all of the vaccine components Orders Placed This Encounter  Procedures   Ambulatory referral to Psychiatry   Ambulatory referral to Dermatology    Caryl Pina, MD [facility] 12/07/2022, 4:48 PM

## 2022-12-08 ENCOUNTER — Other Ambulatory Visit: Payer: Self-pay | Admitting: Family Medicine

## 2022-12-08 ENCOUNTER — Telehealth: Payer: Self-pay

## 2022-12-08 DIAGNOSIS — F32A Depression, unspecified: Secondary | ICD-10-CM

## 2022-12-08 DIAGNOSIS — F339 Major depressive disorder, recurrent, unspecified: Secondary | ICD-10-CM

## 2022-12-08 DIAGNOSIS — F4310 Post-traumatic stress disorder, unspecified: Secondary | ICD-10-CM

## 2022-12-08 NOTE — Telephone Encounter (Signed)
(  Key: [id number])  Nurtec  Your information has been sent to [facility].

## 2022-12-11 NOTE — Telephone Encounter (Addendum)
Pharmacy Patient Advocate Encounter  Received notification from [facility] that the request for prior authorization for Nurtec ODT has been denied due to No denial letter received, calling insurance to send.

## 2022-12-29 DIAGNOSIS — F331 Major depressive disorder, recurrent, moderate: Secondary | ICD-10-CM | POA: Diagnosis not present

## 2022-12-29 DIAGNOSIS — F411 Generalized anxiety disorder: Secondary | ICD-10-CM | POA: Diagnosis not present

## 2022-12-29 DIAGNOSIS — Z79899 Other long term (current) drug therapy: Secondary | ICD-10-CM | POA: Diagnosis not present

## 2023-01-14 ENCOUNTER — Encounter: Payer: Self-pay | Admitting: Family Medicine

## 2023-01-14 ENCOUNTER — Ambulatory Visit: Payer: BC Managed Care – PPO | Admitting: Family Medicine

## 2023-01-14 ENCOUNTER — Telehealth: Payer: Self-pay

## 2023-01-14 VITALS — BP 100/79 | HR 100 | Ht 67.0 in | Wt 267.0 lb

## 2023-01-14 DIAGNOSIS — F4310 Post-traumatic stress disorder, unspecified: Secondary | ICD-10-CM | POA: Diagnosis not present

## 2023-01-14 DIAGNOSIS — G43009 Migraine without aura, not intractable, without status migrainosus: Secondary | ICD-10-CM | POA: Diagnosis not present

## 2023-01-14 DIAGNOSIS — F32A Depression, unspecified: Secondary | ICD-10-CM

## 2023-01-14 DIAGNOSIS — F419 Anxiety disorder, unspecified: Secondary | ICD-10-CM | POA: Diagnosis not present

## 2023-01-14 DIAGNOSIS — F339 Major depressive disorder, recurrent, unspecified: Secondary | ICD-10-CM

## 2023-01-14 MED ORDER — UBRELVY 50 MG PO TABS: 50.0000 mg | ORAL_TABLET | Freq: Every day | ORAL | 2 refills | Status: DC | PRN

## 2023-01-14 MED ORDER — RIZATRIPTAN BENZOATE 10 MG PO TABS: 10.0000 mg | ORAL_TABLET | ORAL | 3 refills | Status: DC | PRN

## 2023-01-14 NOTE — Telephone Encounter (Signed)
[name] (Key: [id number]) PA Case ID #: [id number] Rx #: [id number] Need Help? Call us at [phone] Status sent iconSent to Plan today Drug Roselyn Meier 50MG  tablets ePA cloud Probation officer PA Form [phone] NCPDP)

## 2023-01-14 NOTE — Progress Notes (Signed)
BP 100/79   Pulse 100   Ht 5\' 7"  (1.702 m)   Wt 267 lb (121.1 kg)   SpO2 98%   BMI 41.82 kg/m    Subjective:   Patient ID: Kelly Campbell, female    DOB: 1991-08-01, 32 y.o.   MRN: [id number]  HPI: Kelly Campbell is a 32 y.o. female presenting on 01/14/2023 for Medical Management of Chronic Issues, Depression, and Migraine   HPI Depression recheck Patient is coming in for depression recheck.  She is currently taking Wellbutrin and Zoloft and topiramate. [facility] is [facility] and is doing counseling. And is seeing Dr May. They increased zoloft to 200 and buspirone 7.5 3 times a day.  She feels like it is helping. She denies suicidal ideations. She starts counseling next week.   Migraine recheck She is coming in for recheck of her migraines.  She is currently using Maxalt and topiramate for her migraines. The Maxalt was out of date she says her headaches have been a little more frequent or little more severe but she has not had Maxalt or Nurtec and Nurtec was not covered by her insurance.  Relevant past medical, surgical, family and social history reviewed and updated as indicated. Interim medical history since our last visit reviewed. Allergies and medications reviewed and updated.  Review of Systems  Constitutional:  Negative for chills and fever.  Eyes:  Negative for redness and visual disturbance.  Respiratory:  Negative for chest tightness and shortness of breath.   Cardiovascular:  Negative for chest pain and leg swelling.  Genitourinary:  Negative for difficulty urinating and dysuria.  Musculoskeletal:  Negative for back pain and gait problem.  Skin:  Negative for rash.  Neurological:  Positive for headaches. Negative for dizziness and light-headedness.  Psychiatric/Behavioral:  Negative for agitation and behavioral problems.   All other systems reviewed and are negative.   Per HPI unless specifically indicated above   Allergies as of 01/14/2023       Reactions    Penicillins         Medication List        Accurate as of January 14, 2023  2:30 PM. If you have any questions, ask your nurse or doctor.          STOP taking these medications    hydrOXYzine 25 MG capsule Commonly known as: VISTARIL Stopped by: Fransisca Kaufmann Eather Chaires, MD   Nurtec 75 MG Tbdp Generic drug: Rimegepant Sulfate Stopped by: Fransisca Kaufmann Lexxus Underhill, MD       TAKE these medications    albuterol 108 (90 Base) MCG/ACT inhaler Commonly known as: VENTOLIN HFA Inhale 1-2 puffs into the lungs every 6 (six) hours as needed for wheezing or shortness of breath.   buPROPion 300 MG 24 hr tablet Commonly known as: Wellbutrin XL Take 1 tablet (300 mg total) by mouth daily.   busPIRone 7.5 MG tablet Commonly known as: BUSPAR Take 7.5 mg by mouth 3 (three) times daily.   diclofenac 75 MG EC tablet Commonly known as: VOLTAREN Take 1 tablet (75 mg total) by mouth 2 (two) times daily.   etonogestrel-ethinyl estradiol 0.12-0.015 MG/24HR vaginal ring Commonly known as: EluRyng INSERT 1 RING VAGINALLY AND LEAVE IN PLACE FOR 3 CONSECUTIVE WEEKS THEN REMOVE FOR 1 WEEK   fluticasone 50 MCG/ACT nasal spray Commonly known as: FLONASE Place 2 sprays into both nostrils daily.   rizatriptan 10 MG tablet Commonly known as: Maxalt Take 1 tablet (10 mg total) by mouth as needed  for migraine. May repeat in 2 hours if needed   sertraline 100 MG tablet Commonly known as: ZOLOFT Take 200 mg by mouth daily. What changed: Another medication with the same name was removed. Continue taking this medication, and follow the directions you see here. Changed by: Fransisca Kaufmann Meredeth Furber, MD   topiramate 100 MG tablet Commonly known as: Topamax Take 1 tablet (100 mg total) by mouth at bedtime as needed.   Ubrelvy 50 MG Tabs Generic drug: Ubrogepant Take 1 tablet (50 mg total) by mouth daily as needed. Started by: Worthy Rancher, MD   Vitamin D (Ergocalciferol) 1.25 MG (50000 UNIT) Caps  capsule Commonly known as: DRISDOL Take 1 capsule (50,000 Units total) by mouth once a week.         Objective:   BP 100/79   Pulse 100   Ht 5\' 7"  (1.702 m)   Wt 267 lb (121.1 kg)   SpO2 98%   BMI 41.82 kg/m   Wt Readings from Last 3 Encounters:  01/14/23 267 lb (121.1 kg)  12/07/22 266 lb (120.7 kg)  10/15/22 261 lb (118.4 kg)    Physical Exam Vitals and nursing note reviewed.  Constitutional:      General: She is not in acute distress.    Appearance: She is well-developed. She is not diaphoretic.  Eyes:     Conjunctiva/sclera: Conjunctivae normal.  Cardiovascular:     Rate and Rhythm: Normal rate and regular rhythm.     Heart sounds: Normal heart sounds. No murmur heard. Pulmonary:     Effort: Pulmonary effort is normal. No respiratory distress.     Breath sounds: Normal breath sounds. No wheezing.  Musculoskeletal:        General: No tenderness. Normal range of motion.  Skin:    General: Skin is warm and dry.     Findings: No rash.  Neurological:     Mental Status: She is alert and oriented to person, place, and time.     Coordination: Coordination normal.  Psychiatric:        Behavior: Behavior normal.       Assessment & Plan:   Problem List Items Addressed This Visit       Cardiovascular and Mediastinum   Migraine without aura and without status migrainosus, not intractable   Relevant Medications   rizatriptan (MAXALT) 10 MG tablet   Ubrogepant (UBRELVY) 50 MG TABS   sertraline (ZOLOFT) 100 MG tablet     Other   Depression, recurrent   Relevant Medications   busPIRone (BUSPAR) 7.5 MG tablet   sertraline (ZOLOFT) 100 MG tablet   Anxiety and depression - Primary   Relevant Medications   busPIRone (BUSPAR) 7.5 MG tablet   sertraline (ZOLOFT) 100 MG tablet   PTSD (post-traumatic stress disorder)   Relevant Medications   busPIRone (BUSPAR) 7.5 MG tablet   sertraline (ZOLOFT) 100 MG tablet    Plan patient is seeing psychiatry and has made  adjustments on her medicines and she feels like she is doing little better with that.  Her migraines have been worse but she has been out of the Maxalt and has not hide anything to use acutely for migraines.  She is still taking the topiramate for prevention.  She thinks she may be having a little more frequently but mainly its just been more severe because she has not had anything to take to stop them Follow up plan: Return in about 3 months (around 04/15/2023), or if symptoms worsen or  fail to improve, for physical and bloodwork.  Counseling provided for all of the vaccine components No orders of the defined types were placed in this encounter.   Caryl Pina, MD [facility] 01/14/2023, 2:30 PM

## 2023-01-20 NOTE — Telephone Encounter (Signed)
Pharmacy Patient Advocate Encounter  Received notification from [employer] that the request for prior authorization for Kelly Campbell has been denied due to .    Please be advised we currently do not have a Pharmacist to review denials, therefore you will need to process appeals accordingly as needed. Thanks for your support at this time.   You may call [phone] or fax [phone], to appeal.

## 2023-01-21 DIAGNOSIS — F331 Major depressive disorder, recurrent, moderate: Secondary | ICD-10-CM | POA: Diagnosis not present

## 2023-01-21 DIAGNOSIS — F411 Generalized anxiety disorder: Secondary | ICD-10-CM | POA: Diagnosis not present

## 2023-01-21 NOTE — Telephone Encounter (Signed)
I do not know how this came back as not having tried all those medicines, she currently has an active prescription of Maxalt and we are trying for Bernita Raisin because it is not fully controlling it on the worst headaches.  She has tried Nurtec as well because she was given a sample for it.  Today requests that we try multiple triptans?

## 2023-01-28 DIAGNOSIS — F331 Major depressive disorder, recurrent, moderate: Secondary | ICD-10-CM | POA: Diagnosis not present

## 2023-01-28 DIAGNOSIS — F411 Generalized anxiety disorder: Secondary | ICD-10-CM | POA: Diagnosis not present

## 2023-02-12 MED ORDER — SUMATRIPTAN SUCCINATE 25 MG PO TABS: 25.0000 mg | ORAL_TABLET | ORAL | 0 refills | Status: DC | PRN

## 2023-02-12 NOTE — Telephone Encounter (Signed)
Per prior notes, pt needs to have tried at least 2 triptans. Her trial of Maxalt (rizatriptan) has been documented and reported in [place]. Has pt tried any other triptans? Please be advised that we do not currently have a pharmacist to process appeals and will need an appeal letter from the office, providing clinical rationale as to why pt is unable to trial a second triptan.

## 2023-02-12 NOTE — Telephone Encounter (Signed)
Pt has been informed.  States that she has already picked up the [place] and that it was only a few dollars.    Informed that Imitrex is a the pharmacy based off of the denial for Bernita Raisin and needed to try two triptans to get an approval.  Pt will call pharmacy to verify cost and refills of Ubrelvy and will continue it if helpful.  Otherwise, we can try for Nurtec in the future after she tried Imitrex if needed.  Pt states that she will call back to make aware that the Bernita Raisin is really covered by her insurance.

## 2023-02-12 NOTE — Telephone Encounter (Signed)
Please let the patient know that her insurance company came back and said that if we want to try Ubrelvy or Nurtec one of the newer agents and get it covered that she will have to try at least 2 different triptans so I have sent a prescription for Imitrex, after she finishes up with her Maxalt she can try the Imitrex in the future and then we can say we have tried it and try again for some of these newer agents in the future.

## 2023-02-25 DIAGNOSIS — F331 Major depressive disorder, recurrent, moderate: Secondary | ICD-10-CM | POA: Diagnosis not present

## 2023-02-25 DIAGNOSIS — F909 Attention-deficit hyperactivity disorder, unspecified type: Secondary | ICD-10-CM | POA: Diagnosis not present

## 2023-02-25 DIAGNOSIS — F411 Generalized anxiety disorder: Secondary | ICD-10-CM | POA: Diagnosis not present

## 2023-03-12 DIAGNOSIS — F909 Attention-deficit hyperactivity disorder, unspecified type: Secondary | ICD-10-CM | POA: Diagnosis not present

## 2023-03-12 DIAGNOSIS — F331 Major depressive disorder, recurrent, moderate: Secondary | ICD-10-CM | POA: Diagnosis not present

## 2023-03-12 DIAGNOSIS — F411 Generalized anxiety disorder: Secondary | ICD-10-CM | POA: Diagnosis not present

## 2023-03-25 ENCOUNTER — Ambulatory Visit: Payer: BC Managed Care – PPO | Admitting: Family Medicine

## 2023-03-25 ENCOUNTER — Encounter: Payer: Self-pay | Admitting: Family Medicine

## 2023-03-25 DIAGNOSIS — Z6841 Body Mass Index (BMI) 40.0 and over, adult: Secondary | ICD-10-CM

## 2023-03-25 MED ORDER — TIRZEPATIDE-WEIGHT MANAGEMENT 2.5 MG/0.5ML ~~LOC~~ SOAJ: 2.5000 mg | SUBCUTANEOUS | 0 refills | Status: DC

## 2023-03-25 NOTE — Progress Notes (Signed)
BP 130/82   Pulse (!) 101   Ht 5\' 7"  (1.702 m)   Wt 262 lb (118.8 kg)   SpO2 98%   BMI 41.04 kg/m    Subjective:   Patient ID: Kelly Campbell, female    DOB: [birth year], 32 y.o.   MRN: [id number]  HPI: Kelly Campbell is a 32 y.o. female presenting on 03/25/2023 for Medical Management of Chronic Issues and weight loss management (States that Reginal Lutes is not covered by her insurance. Psych wanted pt to discuss other options)   HPI Morbid obesity and weight loss Patient is coming in to discuss morbid obesity and weight loss again.  The [facility] was not covered.  We discussed other options including Contrave but she is already on Wellbutrin and phentermine but she is already on a stimulant in Vyvanse.  We discussed healthy weight and wellness clinic as an option versus stepdown on Rybelsus.  Patient does not have diabetes so coverage of symptoms medicines would be less.  We discussed orlistat with diarrhea is main symptom and she already has some loose stools I do not think that is a great option for her.  Relevant past medical, surgical, family and social history reviewed and updated as indicated. Interim medical history since our last visit reviewed. Allergies and medications reviewed and updated.  Review of Systems  Constitutional:  Negative for chills and fever.  Eyes:  Negative for visual disturbance.  Respiratory:  Negative for chest tightness and shortness of breath.   Cardiovascular:  Negative for chest pain and leg swelling.  Genitourinary:  Negative for difficulty urinating and dysuria.  Musculoskeletal:  Negative for back pain and gait problem.  Skin:  Negative for rash.  Neurological:  Negative for dizziness, light-headedness and headaches.  Psychiatric/Behavioral:  Negative for agitation and behavioral problems.   All other systems reviewed and are negative.   Per HPI unless specifically indicated above   Allergies as of 03/25/2023       Reactions   Penicillins          Medication List        Accurate as of March 25, 2023  3:18 PM. If you have any questions, ask your nurse or doctor.          STOP taking these medications    SUMAtriptan 25 MG tablet Commonly known as: Imitrex Stopped by: Nils Pyle, MD       TAKE these medications    albuterol 108 (90 Base) MCG/ACT inhaler Commonly known as: VENTOLIN HFA Inhale 1-2 puffs into the lungs every 6 (six) hours as needed for wheezing or shortness of breath.   buPROPion 300 MG 24 hr tablet Commonly known as: Wellbutrin XL Take 1 tablet (300 mg total) by mouth daily.   busPIRone 7.5 MG tablet Commonly known as: BUSPAR Take 7.5 mg by mouth 3 (three) times daily.   diclofenac 75 MG EC tablet Commonly known as: VOLTAREN Take 1 tablet (75 mg total) by mouth 2 (two) times daily.   etonogestrel-ethinyl estradiol 0.12-0.015 MG/24HR vaginal ring Commonly known as: EluRyng INSERT 1 RING VAGINALLY AND LEAVE IN PLACE FOR 3 CONSECUTIVE WEEKS THEN REMOVE FOR 1 WEEK   fluticasone 50 MCG/ACT nasal spray Commonly known as: FLONASE Place 2 sprays into both nostrils daily.   sertraline 100 MG tablet Commonly known as: ZOLOFT Take 200 mg by mouth daily.   tirzepatide 2.5 MG/0.5ML Pen Commonly known as: ZEPBOUND Inject 2.5 mg into the skin once a week. Started by: Elige Radon  Alorah Mcree, MD   topiramate 100 MG tablet Commonly known as: Topamax Take 1 tablet (100 mg total) by mouth at bedtime as needed.   Ubrelvy 50 MG Tabs Generic drug: Ubrogepant Take 1 tablet (50 mg total) by mouth daily as needed.   Vitamin D (Ergocalciferol) 1.25 MG (50000 UNIT) Caps capsule Commonly known as: DRISDOL Take 1 capsule (50,000 Units total) by mouth once a week.         Objective:   BP 130/82   Pulse (!) 101   Ht 5\' 7"  (1.702 m)   Wt 262 lb (118.8 kg)   SpO2 98%   BMI 41.04 kg/m   Wt Readings from Last 3 Encounters:  03/25/23 262 lb (118.8 kg)  01/14/23 267 lb (121.1 kg)  12/07/22 266  lb (120.7 kg)    Physical Exam Vitals and nursing note reviewed.  Constitutional:      General: She is not in acute distress.    Appearance: She is well-developed. She is not diaphoretic.  Eyes:     Conjunctiva/sclera: Conjunctivae normal.  Cardiovascular:     Rate and Rhythm: Normal rate and regular rhythm.     Heart sounds: Normal heart sounds. No murmur heard. Pulmonary:     Effort: Pulmonary effort is normal. No respiratory distress.     Breath sounds: Normal breath sounds. No wheezing.  Musculoskeletal:        General: No tenderness. Normal range of motion.  Skin:    General: Skin is warm and dry.     Findings: No rash.  Neurological:     Mental Status: She is alert and oriented to person, place, and time.     Coordination: Coordination normal.  Psychiatric:        Behavior: Behavior normal.       Assessment & Plan:   Problem List Items Addressed This Visit   None Visit Diagnoses     Morbid obesity (HCC)    -  Primary   Relevant Medications   tirzepatide (ZEPBOUND) 2.5 MG/0.5ML Pen      Patient's BMI is >30 mg/m2.  Patient's current BMI is Body mass index is 41.04 kg/m.[name]  Patient is currently enrolled in a healthy eating plan along with encouraged exercise.  Yes Patient has contraindications to phentermine, Contrave & Qsymia (contains phentermine), she is already on phentermine and Vyvanse so she could not do anything with the stimulant or the Wellbutrin in it..  Patient does not have a personal or family history of medullary thyroid carcinoma (MTC) or Multiple Endocrine Neoplasia syndrome type 2 (MEN 2).   Follow up plan: Return in about 3 months (around 06/25/2023), or if symptoms worsen or fail to improve, for Weight loss recheck.  Counseling provided for all of the vaccine components No orders of the defined types were placed in this encounter.   Arville Care, MD [facility] 03/25/2023, 3:18 PM

## 2023-04-08 DIAGNOSIS — F331 Major depressive disorder, recurrent, moderate: Secondary | ICD-10-CM | POA: Diagnosis not present

## 2023-04-08 DIAGNOSIS — F909 Attention-deficit hyperactivity disorder, unspecified type: Secondary | ICD-10-CM | POA: Diagnosis not present

## 2023-04-08 DIAGNOSIS — F411 Generalized anxiety disorder: Secondary | ICD-10-CM | POA: Diagnosis not present

## 2023-04-21 ENCOUNTER — Ambulatory Visit: Payer: BC Managed Care – PPO | Admitting: Family Medicine

## 2023-04-21 ENCOUNTER — Telehealth: Payer: Self-pay | Admitting: Family Medicine

## 2023-04-21 ENCOUNTER — Other Ambulatory Visit (HOSPITAL_COMMUNITY)
Admission: RE | Admit: 2023-04-21 | Discharge: 2023-04-21 | Disposition: A | Discharge status: Home / Self Care | Payer: BC Managed Care – PPO | Source: Ambulatory Visit | Attending: Family Medicine | Admitting: Family Medicine

## 2023-04-21 ENCOUNTER — Encounter: Payer: Self-pay | Admitting: Family Medicine

## 2023-04-21 VITALS — BP 109/70 | HR 96 | Ht 67.0 in | Wt 258.0 lb

## 2023-04-21 DIAGNOSIS — Z01419 Encounter for gynecological examination (general) (routine) without abnormal findings: Secondary | ICD-10-CM

## 2023-04-21 DIAGNOSIS — F32A Depression, unspecified: Secondary | ICD-10-CM

## 2023-04-21 DIAGNOSIS — Z01411 Encounter for gynecological examination (general) (routine) with abnormal findings: Secondary | ICD-10-CM | POA: Diagnosis not present

## 2023-04-21 DIAGNOSIS — F4310 Post-traumatic stress disorder, unspecified: Secondary | ICD-10-CM | POA: Diagnosis not present

## 2023-04-21 DIAGNOSIS — F419 Anxiety disorder, unspecified: Secondary | ICD-10-CM

## 2023-04-21 DIAGNOSIS — Z136 Encounter for screening for cardiovascular disorders: Secondary | ICD-10-CM | POA: Diagnosis not present

## 2023-04-21 DIAGNOSIS — F339 Major depressive disorder, recurrent, unspecified: Secondary | ICD-10-CM

## 2023-04-21 DIAGNOSIS — Z0279 Encounter for issue of other medical certificate: Secondary | ICD-10-CM

## 2023-04-21 MED ORDER — BUPROPION HCL ER (XL) 300 MG PO TB24: 300.0000 mg | ORAL_TABLET | Freq: Every day | ORAL | 1 refills | Status: DC

## 2023-04-21 NOTE — Progress Notes (Signed)
BP 109/70   Pulse 96   Ht 5\' 7"  (1.702 m)   Wt 258 lb (117 kg)   SpO2 98%   BMI 40.41 kg/m    Subjective:   Patient ID: Kelly Campbell, female    DOB: 1991/09/17, 32 y.o.   MRN: [id number]  HPI: Kelly Campbell is a 32 y.o. female presenting on 04/21/2023 for Medical Management of Chronic Issues (CPE with pap), Obesity, Anxiety, and Depression   HPI Well woman exam Patient denies any chest pain, shortness of breath, headaches or vision issues, abdominal complaints, diarrhea, nausea, vomiting, or joint issues.  She does fight some fungal issues especially under her breasts and in her groin.  She is using Goldbond powder and some Desitin and they do help.  She is doing little bit better right now but she works in a hot and humid place does affect her.  Anxiety depression and PTSD recheck Patient is currently taking Wellbutrin and buspirone and Zoloft and topiramate.  She feels like she is doing well with both her anxiety and her migraines.  She says the only challenges that she is running to is she gets ADHD medicine and it has been out of stock as well.  Relevant past medical, surgical, family and social history reviewed and updated as indicated. Interim medical history since our last visit reviewed. Allergies and medications reviewed and updated.  Review of Systems  Constitutional:  Negative for chills and fever.  HENT:  Negative for congestion, ear discharge, ear pain and tinnitus.   Eyes:  Negative for pain, redness and visual disturbance.  Respiratory:  Negative for cough, chest tightness, shortness of breath and wheezing.   Cardiovascular:  Negative for chest pain, palpitations and leg swelling.  Gastrointestinal:  Negative for abdominal pain, blood in stool, constipation and diarrhea.  Genitourinary:  Negative for difficulty urinating, dysuria and hematuria.  Musculoskeletal:  Negative for back pain, gait problem and myalgias.  Skin:  Positive for rash. Negative for color  change.  Neurological:  Negative for dizziness, weakness, light-headedness and headaches.  Psychiatric/Behavioral:  Negative for agitation, behavioral problems and suicidal ideas.   All other systems reviewed and are negative.   Per HPI unless specifically indicated above   Allergies as of 04/21/2023       Reactions   Penicillins         Medication List        Accurate as of April 21, 2023  3:27 PM. If you have any questions, ask your nurse or doctor.          STOP taking these medications    tirzepatide 2.5 MG/0.5ML Pen Commonly known as: ZEPBOUND Stopped by: Elige Radon Almira Phetteplace, MD       TAKE these medications    albuterol 108 (90 Base) MCG/ACT inhaler Commonly known as: VENTOLIN HFA Inhale 1-2 puffs into the lungs every 6 (six) hours as needed for wheezing or shortness of breath.   buPROPion 300 MG 24 hr tablet Commonly known as: Wellbutrin XL Take 1 tablet (300 mg total) by mouth daily.   busPIRone 7.5 MG tablet Commonly known as: BUSPAR Take 7.5 mg by mouth 3 (three) times daily.   diclofenac 75 MG EC tablet Commonly known as: VOLTAREN Take 1 tablet (75 mg total) by mouth 2 (two) times daily.   etonogestrel-ethinyl estradiol 0.12-0.015 MG/24HR vaginal ring Commonly known as: EluRyng INSERT 1 RING VAGINALLY AND LEAVE IN PLACE FOR 3 CONSECUTIVE WEEKS THEN REMOVE FOR 1 WEEK   fluticasone 50  MCG/ACT nasal spray Commonly known as: FLONASE Place 2 sprays into both nostrils daily.   sertraline 100 MG tablet Commonly known as: ZOLOFT Take 200 mg by mouth daily.   topiramate 100 MG tablet Commonly known as: Topamax Take 1 tablet (100 mg total) by mouth at bedtime as needed.   Ubrelvy 50 MG Tabs Generic drug: Ubrogepant Take 1 tablet (50 mg total) by mouth daily as needed.   Vitamin D (Ergocalciferol) 1.25 MG (50000 UNIT) Caps capsule Commonly known as: DRISDOL Take 1 capsule (50,000 Units total) by mouth once a week.         Objective:    BP 109/70   Pulse 96   Ht 5\' 7"  (1.702 m)   Wt 258 lb (117 kg)   SpO2 98%   BMI 40.41 kg/m   Wt Readings from Last 3 Encounters:  04/21/23 258 lb (117 kg)  03/25/23 262 lb (118.8 kg)  01/14/23 267 lb (121.1 kg)    Physical Exam Vitals and nursing note reviewed.  Constitutional:      General: She is not in acute distress.    Appearance: She is well-developed. She is not diaphoretic.  Eyes:     Conjunctiva/sclera: Conjunctivae normal.     Pupils: Pupils are equal, round, and reactive to light.  Neck:     Thyroid: No thyromegaly.  Cardiovascular:     Rate and Rhythm: Normal rate and regular rhythm.     Heart sounds: Normal heart sounds. No murmur heard. Pulmonary:     Effort: Pulmonary effort is normal. No respiratory distress.     Breath sounds: Normal breath sounds. No wheezing.  Chest:  Breasts:    Breasts are symmetrical.     Right: No inverted nipple, mass, nipple discharge, skin change or tenderness.     Left: No inverted nipple, mass, nipple discharge, skin change or tenderness.  Abdominal:     General: Bowel sounds are normal. There is no distension.     Palpations: Abdomen is soft.     Tenderness: There is no abdominal tenderness. There is no guarding or rebound.  Genitourinary:    Exam position: Supine.     Labia:        Right: No rash or lesion.        Left: No rash or lesion.      Vagina: Normal.     Cervix: No cervical motion tenderness, discharge or friability.     Uterus: Not deviated, not enlarged, not fixed and not tender.      Adnexa:        Right: No mass or tenderness.         Left: No mass or tenderness.    Musculoskeletal:        General: No tenderness. Normal range of motion.     Cervical back: Neck supple.  Lymphadenopathy:     Cervical: No cervical adenopathy.  Skin:    General: Skin is warm and dry.     Findings: Rash (Little spots of yeast dermatitis under her breasts and in her groin.  Not too bad right now) present.  Neurological:      Mental Status: She is alert and oriented to person, place, and time.     Coordination: Coordination normal.  Psychiatric:        Behavior: Behavior normal.       Assessment & Plan:   Problem List Items Addressed This Visit       Other   Depression, recurrent (HCC)  Relevant Medications   buPROPion (WELLBUTRIN XL) 300 MG 24 hr tablet   Anxiety and depression   Relevant Medications   buPROPion (WELLBUTRIN XL) 300 MG 24 hr tablet   PTSD (post-traumatic stress disorder)   Relevant Medications   buPROPion (WELLBUTRIN XL) 300 MG 24 hr tablet   Other Visit Diagnoses     Well woman exam with routine gynecological exam    -  Primary   Relevant Orders   CBC with Differential/Platelet   CMP14+EGFR   Lipid panel   TSH   Cytology - PAP([place])       Will check blood work today, continue current medicine. Follow up plan: Return in about 3 months (around 07/22/2023), or if symptoms worsen or fail to improve, for Anxiety and PTSD recheck.  Counseling provided for all of the vaccine components Orders Placed This Encounter  Procedures   CBC with Differential/Platelet   CMP14+EGFR   Lipid panel   TSH    Arville Care, MD [facility] 04/21/2023, 3:27 PM

## 2023-04-21 NOTE — Telephone Encounter (Signed)
PT dropped off FMLA forms to be completed and signed.  Form Fee Paid? (Y/N)       YES     If NO, form is placed on front office manager desk to hold until payment received. If YES, then form will be placed in the RX/HH Nurse Coordinators box for completion.  Form will not be processed until payment is received

## 2023-04-22 LAB — CBC WITH DIFFERENTIAL/PLATELET
Basophils Absolute: 0 10*3/uL (ref 0.0–0.2)
Basos: 0 %
EOS (ABSOLUTE): 0.1 10*3/uL (ref 0.0–0.4)
Eos: 1 %
Hematocrit: 36.7 % (ref 34.0–46.6)
Hemoglobin: 12 g/dL (ref 11.1–15.9)
Immature Grans (Abs): 0 10*3/uL (ref 0.0–0.1)
Immature Granulocytes: 0 %
Lymphocytes Absolute: 2.2 10*3/uL (ref 0.7–3.1)
Lymphs: 23 %
MCH: 27.6 pg (ref 26.6–33.0)
MCHC: 32.7 g/dL (ref 31.5–35.7)
MCV: 84 fL (ref 79–97)
Monocytes Absolute: 0.6 10*3/uL (ref 0.1–0.9)
Monocytes: 6 %
Neutrophils Absolute: 6.6 10*3/uL (ref 1.4–7.0)
Neutrophils: 70 %
Platelets: 302 10*3/uL (ref 150–450)
RBC: 4.35 x10E6/uL (ref 3.77–5.28)
RDW: 13 % (ref 11.7–15.4)
WBC: 9.5 10*3/uL (ref 3.4–10.8)

## 2023-04-22 LAB — CMP14+EGFR
ALT: 14 IU/L (ref 0–32)
AST: 13 IU/L (ref 0–40)
Albumin: 4 g/dL (ref 3.9–4.9)
Alkaline Phosphatase: 101 IU/L (ref 44–121)
BUN/Creatinine Ratio: 8 — ABNORMAL LOW (ref 9–23)
BUN: 6 mg/dL (ref 6–20)
Bilirubin Total: <0.2 mg/dL (ref 0.0–1.2)
CO2: 16 mmol/L — ABNORMAL LOW (ref 20–29)
Calcium: 9 mg/dL (ref 8.7–10.2)
Chloride: 108 mmol/L — ABNORMAL HIGH (ref 96–106)
Creatinine, Ser: 0.71 mg/dL (ref 0.57–1.00)
Globulin, Total: 2.6 g/dL (ref 1.5–4.5)
Glucose: 104 mg/dL — ABNORMAL HIGH (ref 70–99)
Potassium: 4 mmol/L (ref 3.5–5.2)
Sodium: 139 mmol/L (ref 134–144)
Total Protein: 6.6 g/dL (ref 6.0–8.5)
eGFR: 117 mL/min/{1.73_m2} (ref >59)

## 2023-04-22 LAB — TSH: TSH: 1.19 u[IU]/mL (ref 0.450–4.500)

## 2023-04-22 LAB — LIPID PANEL
Chol/HDL Ratio: 3.5 ratio (ref 0.0–4.4)
Cholesterol, Total: 148 mg/dL (ref 100–199)
HDL: 42 mg/dL (ref >39)
LDL Chol Calc (NIH): 81 mg/dL (ref 0–99)
Triglycerides: 144 mg/dL (ref 0–149)
VLDL Cholesterol Cal: 25 mg/dL (ref 5–40)

## 2023-04-22 NOTE — Telephone Encounter (Signed)
Information completed and forwarded to PCP 

## 2023-04-23 LAB — CYTOLOGY - PAP
Chlamydia: NEGATIVE
Comment: NEGATIVE
Comment: NORMAL
Diagnosis: NEGATIVE
Neisseria Gonorrhea: NEGATIVE

## 2023-04-23 NOTE — Telephone Encounter (Signed)
PCP completed and signed FMLA forms. They have been faxed to [employer] at fax number [phone]. Patient has been contacted and informed they are complete, copy at front.

## 2023-06-09 DIAGNOSIS — F909 Attention-deficit hyperactivity disorder, unspecified type: Secondary | ICD-10-CM | POA: Diagnosis not present

## 2023-06-09 DIAGNOSIS — F331 Major depressive disorder, recurrent, moderate: Secondary | ICD-10-CM | POA: Diagnosis not present

## 2023-06-09 DIAGNOSIS — F411 Generalized anxiety disorder: Secondary | ICD-10-CM | POA: Diagnosis not present

## 2023-07-22 ENCOUNTER — Encounter: Payer: Self-pay | Admitting: Family Medicine

## 2023-07-22 ENCOUNTER — Ambulatory Visit: Payer: BC Managed Care – PPO | Admitting: Family Medicine

## 2023-07-22 VITALS — BP 125/80 | HR 109 | Ht 67.0 in | Wt 246.0 lb

## 2023-07-22 DIAGNOSIS — F4310 Post-traumatic stress disorder, unspecified: Secondary | ICD-10-CM | POA: Diagnosis not present

## 2023-07-22 DIAGNOSIS — F339 Major depressive disorder, recurrent, unspecified: Secondary | ICD-10-CM | POA: Diagnosis not present

## 2023-07-22 DIAGNOSIS — F419 Anxiety disorder, unspecified: Secondary | ICD-10-CM | POA: Diagnosis not present

## 2023-07-22 NOTE — Progress Notes (Signed)
BP 125/80   Pulse (!) 109   Ht 5\' 7"  (1.702 m)   Wt 246 lb (111.6 kg)   SpO2 98%   BMI 38.53 kg/m    Subjective:   Patient ID: Kelly Campbell, female    DOB: 04-30-1991, 32 y.o.   MRN: [id number]  HPI: Kelly Campbell is a 31 y.o. female presenting on 07/22/2023 for Medical Management of Chronic Issues, Anxiety, and Depression   HPI Depression and anxiety recheck Patient is coming in today for depression and anxiety recheck.  She is currently taking Zoloft 200 mg daily and Wellbutrin 300 mg daily and buspirone as needed.  Patient not doing as well right now.  She says she had a lot of stressors with her work and her job and they moved her to an isolated desk because she was talking and had focus issues and then they also have had talked with her about being late and she feels like she is possibly in trouble to lose her job and that is been causing a lot of stress and making her not sleep at night because she does not want to wake up late miss her job.  She denies any suicidal ideations or thoughts of hurting herself but just has a lot of anxiety and not sleeping well and stress.  She was given trazodone by her psychiatry and she has been taking 25-50 concerned about whether or not she has not been able to wake up from it.    07/22/2023    2:14 PM 04/21/2023    3:01 PM 03/25/2023    2:51 PM 01/14/2023    2:37 PM 12/07/2022    4:16 PM  Depression screen PHQ 2/9  Decreased Interest 1 1 1 2 1   Down, Depressed, Hopeless 2 0 0 1 1  PHQ - 2 Score 3 1 1 3 2   Altered sleeping 3 2 0 3 2  Tired, decreased energy 3 3 1 3 3   Change in appetite 0 0 1 2 0  Feeling bad or failure about yourself  3 1 0 1 0  Trouble concentrating 3 2 2 3 2   Moving slowly or fidgety/restless 0 1 2 1 1   Suicidal thoughts 0 0 0 0 0  PHQ-9 Score 15 10 7 16 10   Difficult doing work/chores Extremely dIfficult Very difficult Very difficult Very difficult Very difficult     Relevant past medical, surgical, family and  social history reviewed and updated as indicated. Interim medical history since our last visit reviewed. Allergies and medications reviewed and updated.  Review of Systems  Constitutional:  Negative for chills and fever.  Eyes:  Negative for visual disturbance.  Respiratory:  Negative for chest tightness and shortness of breath.   Cardiovascular:  Negative for chest pain and leg swelling.  Genitourinary:  Negative for difficulty urinating and dysuria.  Skin:  Negative for rash.  Neurological:  Negative for dizziness, light-headedness and headaches.  Psychiatric/Behavioral:  Positive for dysphoric mood and sleep disturbance. Negative for agitation, behavioral problems, self-injury and suicidal ideas. The patient is nervous/anxious.   All other systems reviewed and are negative.   Per HPI unless specifically indicated above   Allergies as of 07/22/2023       Reactions   Penicillins         Medication List        Accurate as of July 22, 2023  2:44 PM. If you have any questions, ask your nurse or doctor.  STOP taking these medications    diclofenac 75 MG EC tablet Commonly known as: VOLTAREN Stopped by: Elige Radon Shandelle Borrelli       TAKE these medications    albuterol 108 (90 Base) MCG/ACT inhaler Commonly known as: VENTOLIN HFA Inhale 1-2 puffs into the lungs every 6 (six) hours as needed for wheezing or shortness of breath.   buPROPion 300 MG 24 hr tablet Commonly known as: Wellbutrin XL Take 1 tablet (300 mg total) by mouth daily.   busPIRone 7.5 MG tablet Commonly known as: BUSPAR Take 7.5 mg by mouth 3 (three) times daily.   etonogestrel-ethinyl estradiol 0.12-0.015 MG/24HR vaginal ring Commonly known as: EluRyng INSERT 1 RING VAGINALLY AND LEAVE IN PLACE FOR 3 CONSECUTIVE WEEKS THEN REMOVE FOR 1 WEEK   fluticasone 50 MCG/ACT nasal spray Commonly known as: FLONASE Place 2 sprays into both nostrils daily.   sertraline 100 MG tablet Commonly  known as: ZOLOFT Take 200 mg by mouth daily.   topiramate 100 MG tablet Commonly known as: Topamax Take 1 tablet (100 mg total) by mouth at bedtime as needed.   Ubrelvy 50 MG Tabs Generic drug: Ubrogepant Take 1 tablet (50 mg total) by mouth daily as needed.   Vitamin D (Ergocalciferol) 1.25 MG (50000 UNIT) Caps capsule Commonly known as: DRISDOL Take 1 capsule (50,000 Units total) by mouth once a week.         Objective:   BP 125/80   Pulse (!) 109   Ht 5\' 7"  (1.702 m)   Wt 246 lb (111.6 kg)   SpO2 98%   BMI 38.53 kg/m   Wt Readings from Last 3 Encounters:  07/22/23 246 lb (111.6 kg)  04/21/23 258 lb (117 kg)  03/25/23 262 lb (118.8 kg)    Physical Exam Vitals and nursing note reviewed.  Constitutional:      Appearance: Normal appearance.  Neurological:     Mental Status: She is alert.  Psychiatric:        Mood and Affect: Mood is anxious and depressed. Affect is tearful.        Thought Content: Thought content does not include suicidal ideation. Thought content does not include suicidal plan.       Assessment & Plan:   Problem List Items Addressed This Visit       Other   Depression, recurrent (HCC)   Anxiety and depression - Primary   PTSD (post-traumatic stress disorder)    Patient will talk to her psychiatrist first and she wants to see about increasing the trazodone.  She feels that she can get better sleep and she will feel a lot better during the day.  Obviously she has some stressors but she feels like before the job stressors she was doing really well and her anxiety medicines and does not want to adjust those today. Follow up plan: Return in about 3 months (around 10/22/2023), or if symptoms worsen or fail to improve, for Depression and anxiety.  Counseling provided for all of the vaccine components No orders of the defined types were placed in this encounter.   Arville Care, MD [facility] 07/22/2023, 2:44  PM

## 2023-07-28 DIAGNOSIS — F331 Major depressive disorder, recurrent, moderate: Secondary | ICD-10-CM | POA: Diagnosis not present

## 2023-07-28 DIAGNOSIS — F411 Generalized anxiety disorder: Secondary | ICD-10-CM | POA: Diagnosis not present

## 2023-07-28 DIAGNOSIS — F909 Attention-deficit hyperactivity disorder, unspecified type: Secondary | ICD-10-CM | POA: Diagnosis not present

## 2023-08-26 DIAGNOSIS — F909 Attention-deficit hyperactivity disorder, unspecified type: Secondary | ICD-10-CM | POA: Diagnosis not present

## 2023-08-26 DIAGNOSIS — F331 Major depressive disorder, recurrent, moderate: Secondary | ICD-10-CM | POA: Diagnosis not present

## 2023-08-26 DIAGNOSIS — F411 Generalized anxiety disorder: Secondary | ICD-10-CM | POA: Diagnosis not present

## 2023-09-20 ENCOUNTER — Telehealth: Payer: Self-pay | Admitting: *Deleted

## 2023-09-20 DIAGNOSIS — Z3009 Encounter for other general counseling and advice on contraception: Secondary | ICD-10-CM

## 2023-09-20 MED ORDER — ETONOGESTREL-ETHINYL ESTRADIOL 0.12-0.015 MG/24HR VA RING
VAGINAL_RING | VAGINAL | 1 refills | Status: DC
Start: 2023-09-20 — End: 2024-05-29

## 2023-09-20 NOTE — Telephone Encounter (Signed)
Copied from CRM [id number]. Topic: Clinical - Medication Refill >> Sep 20, 2023  8:59 AM Adelina Mings wrote: Most Recent Primary Care Visit:  Provider: Arville Care A  Department: [facility]  Visit Type: OFFICE VISIT  Date: 07/22/2023  Medication: etonogestrel-ethinyl estradiol (ELURYNG) 0.12-0.015 MG/24HR vaginal ring  Has the patient contacted their pharmacy? No (Agent: If no, request that the patient contact the pharmacy for the refill. If patient does not wish to contact the pharmacy document the reason why and proceed with request.) (Agent: If yes, when and what did the pharmacy advise?)  Is this the correct pharmacy for this prescription? Yes If no, delete pharmacy and type the correct one.  This is the patient's preferred pharmacy:  [facility] [address] [place] - [address] [place] [address] [place] [address] Phone: [phone] Fax: [phone]   Has the prescription been filled recently? No  Is the patient out of the medication? Yes  Has the patient been seen for an appointment in the last year OR does the patient have an upcoming appointment? Yes  Can we respond through MyChart? Yes  Agent: Please be advised that Rx refills may take up to 3 business days. We ask that you follow-up with your pharmacy.

## 2023-09-20 NOTE — Telephone Encounter (Signed)
Aware refills sent into pharmacy until July

## 2023-09-23 DIAGNOSIS — F411 Generalized anxiety disorder: Secondary | ICD-10-CM | POA: Diagnosis not present

## 2023-09-23 DIAGNOSIS — F331 Major depressive disorder, recurrent, moderate: Secondary | ICD-10-CM | POA: Diagnosis not present

## 2023-09-23 DIAGNOSIS — F909 Attention-deficit hyperactivity disorder, unspecified type: Secondary | ICD-10-CM | POA: Diagnosis not present

## 2023-10-03 ENCOUNTER — Other Ambulatory Visit: Payer: Self-pay | Admitting: Family Medicine

## 2023-10-03 DIAGNOSIS — G43009 Migraine without aura, not intractable, without status migrainosus: Secondary | ICD-10-CM

## 2023-10-15 ENCOUNTER — Other Ambulatory Visit (HOSPITAL_COMMUNITY): Payer: Self-pay

## 2023-10-15 ENCOUNTER — Telehealth: Payer: Self-pay

## 2023-10-15 NOTE — Telephone Encounter (Signed)
 Pharmacy Patient Advocate Encounter   Received notification from CoverMyMeds that prior authorization for Ubrelvy  50MG  tablets is required/requested.   Insurance verification completed.   The patient is insured through [id number] .   Per test claim: PA required; PA submitted to above mentioned insurance via CoverMyMeds Key/confirmation #/EOC [id number] Status is pending

## 2023-10-20 ENCOUNTER — Other Ambulatory Visit (HOSPITAL_COMMUNITY): Payer: Self-pay

## 2023-10-20 NOTE — Telephone Encounter (Signed)
 Pharmacy Patient Advocate Encounter  Received notification from [facility] that Prior Authorization for Ubrelvy  50MG  tablets  has been APPROVED from 10/19/2023 to 10/18/2024. Ran test claim, Copay is $0.00. This test claim was processed through [facility]- copay amounts may vary at other pharmacies due to pharmacy/plan contracts, or as the patient moves through the different stages of their insurance plan.   PA #/Case ID/Reference #: PA Case ID #: [id number]

## 2023-10-25 ENCOUNTER — Ambulatory Visit: Payer: BC Managed Care – PPO | Admitting: Family Medicine

## 2023-10-25 ENCOUNTER — Encounter: Payer: Self-pay | Admitting: Family Medicine

## 2023-10-25 DIAGNOSIS — F339 Major depressive disorder, recurrent, unspecified: Secondary | ICD-10-CM | POA: Diagnosis not present

## 2023-10-25 DIAGNOSIS — G43009 Migraine without aura, not intractable, without status migrainosus: Secondary | ICD-10-CM

## 2023-10-25 DIAGNOSIS — F32A Depression, unspecified: Secondary | ICD-10-CM

## 2023-10-25 DIAGNOSIS — F419 Anxiety disorder, unspecified: Secondary | ICD-10-CM

## 2023-10-25 DIAGNOSIS — F4310 Post-traumatic stress disorder, unspecified: Secondary | ICD-10-CM | POA: Diagnosis not present

## 2023-10-25 MED ORDER — BUPROPION HCL ER (XL) 300 MG PO TB24
300.0000 mg | ORAL_TABLET | Freq: Every day | ORAL | 1 refills | Status: DC
Start: 2023-10-25 — End: 2024-02-25

## 2023-10-25 MED ORDER — TOPIRAMATE 100 MG PO TABS
100.0000 mg | ORAL_TABLET | Freq: Every evening | ORAL | 1 refills | Status: DC | PRN
Start: 2023-10-25 — End: 2024-02-25

## 2023-10-25 NOTE — Progress Notes (Signed)
 BP 122/81   Pulse (!) 102   Ht 5' 7 (1.702 m)   Wt 245 lb (111.1 kg)   SpO2 99%   BMI 38.37 kg/m    Subjective:   Patient ID: Kelly Campbell, female    DOB: [birth year], 33 y.o.   MRN: [id number]  HPI: Kelly Campbell is a 33 y.o. female presenting on 10/25/2023 for Medical Management of Chronic Issues, Anxiety, and Depression  Anxiety and Depression Patient is coming in for an anxiety and depression recheck. She is currently taking bupropion , buspirone, and sertraline . She feels like her symptoms are adequately controlled with this regimen. She did experience more stress over the holiday season since there was less time for her to decompress with family and friends constantly present. She also was recently prescribed Vyvance by another provider, and she feels like this medicine is helping with her anxiety as well since she is able to focus better.     10/25/2023    2:50 PM 07/22/2023    2:14 PM 04/21/2023    3:01 PM  PHQ9 SCORE ONLY  PHQ-9 Total Score 11 15 10        10/25/2023    2:51 PM 07/22/2023    2:14 PM 04/21/2023    3:02 PM 03/25/2023    2:51 PM  GAD 7 : Generalized Anxiety Score  Nervous, Anxious, on Edge 2 3 1 2   Control/stop worrying 1 3 1 1   Worry too much - different things 1 3 1 1   Trouble relaxing 1 3 1 2   Restless 1 2 0 2  Easily annoyed or irritable 2 2 1 1   Afraid - awful might happen 0 2 0 0  Total GAD 7 Score 8 18 5 9   Anxiety Difficulty  Extremely difficult Very difficult Very difficult   Birth Control/Menorrhagia Patient is currently prescribed NuvaRing. She reports her periods have been manageable while taking this medicine, but she is trying to wean herself off birth control since her husband will have a vasectomy soon. Her first period off the NuvaRing was very heavy as she was bleeding though both tampons and pads. She would like to keep the NuvaRing active on her medication list just in case she needs it for period control.  Relevant past medical,  surgical, family and social history reviewed and updated as indicated. Interim medical history since our last visit reviewed. Allergies and medications reviewed and updated.  Review of Systems  Constitutional:  Negative for chills, fatigue and fever.  HENT:  Negative for congestion and sore throat.   Eyes:  Negative for visual disturbance.  Respiratory:  Negative for chest tightness and shortness of breath.   Cardiovascular:  Negative for chest pain and palpitations.  Gastrointestinal:  Positive for constipation. Negative for abdominal pain and diarrhea.  Genitourinary:  Negative for difficulty urinating, dysuria and frequency.  Musculoskeletal:  Negative for myalgias.  Neurological:  Negative for syncope and light-headedness.  Psychiatric/Behavioral:  Positive for dysphoric mood. Negative for self-injury and suicidal ideas. The patient is nervous/anxious.     Per HPI unless specifically indicated above   Allergies as of 10/25/2023       Reactions   Penicillins         Medication List        Accurate as of October 25, 2023  3:11 PM. If you have any questions, ask your nurse or doctor.          albuterol  108 (90 Base) MCG/ACT inhaler Commonly known as: VENTOLIN  HFA  Inhale 1-2 puffs into the lungs every 6 (six) hours as needed for wheezing or shortness of breath.   buPROPion  300 MG 24 hr tablet Commonly known as: Wellbutrin  XL Take 1 tablet (300 mg total) by mouth daily.   busPIRone 7.5 MG tablet Commonly known as: BUSPAR Take 7.5 mg by mouth 3 (three) times daily.   etonogestrel -ethinyl estradiol  0.12-0.015 MG/24HR vaginal ring Commonly known as: EluRyng INSERT 1 RING VAGINALLY AND LEAVE IN PLACE FOR 3 CONSECUTIVE WEEKS THEN REMOVE FOR 1 WEEK   fluticasone  50 MCG/ACT nasal spray Commonly known as: FLONASE  Place 2 sprays into both nostrils daily.   lisdexamfetamine 30 MG capsule Commonly known as: VYVANSE Take 30 mg by mouth every morning.   sertraline  100 MG  tablet Commonly known as: ZOLOFT  Take 200 mg by mouth daily.   topiramate  100 MG tablet Commonly known as: TOPAMAX  Take 1 tablet (100 mg total) by mouth at bedtime as needed.   Ubrelvy  50 MG Tabs Generic drug: Ubrogepant  Take 1 tablet (50 mg total) by mouth daily as needed.   Vitamin D  (Ergocalciferol ) 1.25 MG (50000 UNIT) Caps capsule Commonly known as: DRISDOL  Take 1 capsule (50,000 Units total) by mouth once a week.         Objective:   BP 122/81   Pulse (!) 102   Ht 5' 7 (1.702 m)   Wt 245 lb (111.1 kg)   SpO2 99%   BMI 38.37 kg/m   Wt Readings from Last 3 Encounters:  10/25/23 245 lb (111.1 kg)  07/22/23 246 lb (111.6 kg)  04/21/23 258 lb (117 kg)    Physical Exam Vitals and nursing note reviewed.  Constitutional:      Appearance: Normal appearance. She is obese.  HENT:     Head: Normocephalic and atraumatic.     Right Ear: External ear normal.     Left Ear: External ear normal.  Eyes:     Conjunctiva/sclera: Conjunctivae normal.  Cardiovascular:     Rate and Rhythm: Normal rate and regular rhythm.     Heart sounds: Normal heart sounds.  Pulmonary:     Effort: Pulmonary effort is normal.     Breath sounds: Normal breath sounds. No wheezing or rales.  Abdominal:     General: Abdomen is flat. There is no distension.     Palpations: Abdomen is soft.     Tenderness: There is no abdominal tenderness. There is no right CVA tenderness or left CVA tenderness.  Musculoskeletal:     Cervical back: Normal range of motion and neck supple. No tenderness.  Skin:    General: Skin is warm and dry.  Neurological:     Mental Status: She is alert and oriented to person, place, and time.  Psychiatric:        Attention and Perception: Attention and perception normal.        Mood and Affect: Mood and affect normal.        Speech: Speech normal.        Behavior: Behavior normal. Behavior is cooperative.        Thought Content: Thought content normal.        Cognition  and Memory: Cognition and memory normal.        Judgment: Judgment normal.    Assessment & Plan:   Problem List Items Addressed This Visit       Cardiovascular and Mediastinum   Migraine without aura and without status migrainosus, not intractable   Relevant Medications  buPROPion  (WELLBUTRIN  XL) 300 MG 24 hr tablet   topiramate  (TOPAMAX ) 100 MG tablet     Other   Depression, recurrent (HCC)   Relevant Medications   buPROPion  (WELLBUTRIN  XL) 300 MG 24 hr tablet   Anxiety and depression   Relevant Medications   buPROPion  (WELLBUTRIN  XL) 300 MG 24 hr tablet   PTSD (post-traumatic stress disorder)   Relevant Medications   buPROPion  (WELLBUTRIN  XL) 300 MG 24 hr tablet    Patient feels symptoms of anxiety and depression are adequately controlled with current medication regimen, so will not make any adjustments. Will keep NuvaRing on medication list just in case patient would like to restart for period control. Counseled patient on the importance of using alternative methods for contraception until husband's vasectomy is proven successful.   Follow up plan: Return in about 3 months (around 01/23/2024), or if symptoms worsen or fail to improve, for Anxiety depression recheck.  Counseling provided for all of the vaccine components No orders of the defined types were placed in this encounter.   Vernell Music, Medical Student [facility] 10/25/2023, 3:11 PM  Patient seen and examined with Vernell Music, medical student, agree with assessment and plan above.  Patient is not currently using NuvaRing but would like to keep it on her active with because she may restart it for period control home.  Feels like she is doing well with anxiety depression stable continue current medicine. Fonda Levins, MD [facility] 11/01/2023, 7:51 AM

## 2023-11-03 DIAGNOSIS — F411 Generalized anxiety disorder: Secondary | ICD-10-CM | POA: Diagnosis not present

## 2023-11-03 DIAGNOSIS — F331 Major depressive disorder, recurrent, moderate: Secondary | ICD-10-CM | POA: Diagnosis not present

## 2023-11-03 DIAGNOSIS — F909 Attention-deficit hyperactivity disorder, unspecified type: Secondary | ICD-10-CM | POA: Diagnosis not present

## 2023-11-12 DIAGNOSIS — F331 Major depressive disorder, recurrent, moderate: Secondary | ICD-10-CM | POA: Diagnosis not present

## 2023-11-12 DIAGNOSIS — F411 Generalized anxiety disorder: Secondary | ICD-10-CM | POA: Diagnosis not present

## 2023-11-12 DIAGNOSIS — F909 Attention-deficit hyperactivity disorder, unspecified type: Secondary | ICD-10-CM | POA: Diagnosis not present

## 2023-12-20 ENCOUNTER — Other Ambulatory Visit: Payer: Self-pay | Admitting: Family Medicine

## 2023-12-23 DIAGNOSIS — M5432 Sciatica, left side: Secondary | ICD-10-CM | POA: Diagnosis not present

## 2023-12-23 DIAGNOSIS — M5431 Sciatica, right side: Secondary | ICD-10-CM | POA: Diagnosis not present

## 2023-12-23 DIAGNOSIS — S335XXA Sprain of ligaments of lumbar spine, initial encounter: Secondary | ICD-10-CM | POA: Diagnosis not present

## 2023-12-27 DIAGNOSIS — M5431 Sciatica, right side: Secondary | ICD-10-CM | POA: Diagnosis not present

## 2023-12-27 DIAGNOSIS — M5432 Sciatica, left side: Secondary | ICD-10-CM | POA: Diagnosis not present

## 2023-12-27 DIAGNOSIS — S335XXA Sprain of ligaments of lumbar spine, initial encounter: Secondary | ICD-10-CM | POA: Diagnosis not present

## 2023-12-28 DIAGNOSIS — S335XXA Sprain of ligaments of lumbar spine, initial encounter: Secondary | ICD-10-CM | POA: Diagnosis not present

## 2023-12-28 DIAGNOSIS — M5431 Sciatica, right side: Secondary | ICD-10-CM | POA: Diagnosis not present

## 2023-12-28 DIAGNOSIS — M5432 Sciatica, left side: Secondary | ICD-10-CM | POA: Diagnosis not present

## 2023-12-30 DIAGNOSIS — M5432 Sciatica, left side: Secondary | ICD-10-CM | POA: Diagnosis not present

## 2023-12-30 DIAGNOSIS — S335XXA Sprain of ligaments of lumbar spine, initial encounter: Secondary | ICD-10-CM | POA: Diagnosis not present

## 2023-12-30 DIAGNOSIS — M5431 Sciatica, right side: Secondary | ICD-10-CM | POA: Diagnosis not present

## 2024-01-04 DIAGNOSIS — S335XXA Sprain of ligaments of lumbar spine, initial encounter: Secondary | ICD-10-CM | POA: Diagnosis not present

## 2024-01-04 DIAGNOSIS — M5431 Sciatica, right side: Secondary | ICD-10-CM | POA: Diagnosis not present

## 2024-01-04 DIAGNOSIS — M5432 Sciatica, left side: Secondary | ICD-10-CM | POA: Diagnosis not present

## 2024-01-05 DIAGNOSIS — M5432 Sciatica, left side: Secondary | ICD-10-CM | POA: Diagnosis not present

## 2024-01-05 DIAGNOSIS — S335XXA Sprain of ligaments of lumbar spine, initial encounter: Secondary | ICD-10-CM | POA: Diagnosis not present

## 2024-01-05 DIAGNOSIS — M5431 Sciatica, right side: Secondary | ICD-10-CM | POA: Diagnosis not present

## 2024-01-10 DIAGNOSIS — F331 Major depressive disorder, recurrent, moderate: Secondary | ICD-10-CM | POA: Diagnosis not present

## 2024-01-10 DIAGNOSIS — F909 Attention-deficit hyperactivity disorder, unspecified type: Secondary | ICD-10-CM | POA: Diagnosis not present

## 2024-01-10 DIAGNOSIS — F411 Generalized anxiety disorder: Secondary | ICD-10-CM | POA: Diagnosis not present

## 2024-01-12 DIAGNOSIS — M5432 Sciatica, left side: Secondary | ICD-10-CM | POA: Diagnosis not present

## 2024-01-12 DIAGNOSIS — S335XXA Sprain of ligaments of lumbar spine, initial encounter: Secondary | ICD-10-CM | POA: Diagnosis not present

## 2024-01-12 DIAGNOSIS — M5431 Sciatica, right side: Secondary | ICD-10-CM | POA: Diagnosis not present

## 2024-01-18 DIAGNOSIS — M5432 Sciatica, left side: Secondary | ICD-10-CM | POA: Diagnosis not present

## 2024-01-18 DIAGNOSIS — M5431 Sciatica, right side: Secondary | ICD-10-CM | POA: Diagnosis not present

## 2024-01-18 DIAGNOSIS — S335XXA Sprain of ligaments of lumbar spine, initial encounter: Secondary | ICD-10-CM | POA: Diagnosis not present

## 2024-01-21 ENCOUNTER — Ambulatory Visit: Payer: BC Managed Care – PPO | Admitting: Family Medicine

## 2024-01-24 DIAGNOSIS — S335XXA Sprain of ligaments of lumbar spine, initial encounter: Secondary | ICD-10-CM | POA: Diagnosis not present

## 2024-01-24 DIAGNOSIS — M5432 Sciatica, left side: Secondary | ICD-10-CM | POA: Diagnosis not present

## 2024-01-24 DIAGNOSIS — M5431 Sciatica, right side: Secondary | ICD-10-CM | POA: Diagnosis not present

## 2024-01-26 ENCOUNTER — Encounter: Payer: Self-pay | Admitting: Family Medicine

## 2024-01-27 DIAGNOSIS — M5431 Sciatica, right side: Secondary | ICD-10-CM | POA: Diagnosis not present

## 2024-01-27 DIAGNOSIS — M5432 Sciatica, left side: Secondary | ICD-10-CM | POA: Diagnosis not present

## 2024-01-27 DIAGNOSIS — S335XXA Sprain of ligaments of lumbar spine, initial encounter: Secondary | ICD-10-CM | POA: Diagnosis not present

## 2024-02-02 DIAGNOSIS — M5432 Sciatica, left side: Secondary | ICD-10-CM | POA: Diagnosis not present

## 2024-02-02 DIAGNOSIS — S335XXA Sprain of ligaments of lumbar spine, initial encounter: Secondary | ICD-10-CM | POA: Diagnosis not present

## 2024-02-02 DIAGNOSIS — M5431 Sciatica, right side: Secondary | ICD-10-CM | POA: Diagnosis not present

## 2024-02-09 DIAGNOSIS — F411 Generalized anxiety disorder: Secondary | ICD-10-CM | POA: Diagnosis not present

## 2024-02-09 DIAGNOSIS — F331 Major depressive disorder, recurrent, moderate: Secondary | ICD-10-CM | POA: Diagnosis not present

## 2024-02-09 DIAGNOSIS — F909 Attention-deficit hyperactivity disorder, unspecified type: Secondary | ICD-10-CM | POA: Diagnosis not present

## 2024-02-10 DIAGNOSIS — S335XXA Sprain of ligaments of lumbar spine, initial encounter: Secondary | ICD-10-CM | POA: Diagnosis not present

## 2024-02-10 DIAGNOSIS — M5432 Sciatica, left side: Secondary | ICD-10-CM | POA: Diagnosis not present

## 2024-02-10 DIAGNOSIS — M5431 Sciatica, right side: Secondary | ICD-10-CM | POA: Diagnosis not present

## 2024-02-18 ENCOUNTER — Encounter (HOSPITAL_COMMUNITY): Payer: Self-pay

## 2024-02-25 ENCOUNTER — Ambulatory Visit: Admitting: Family Medicine

## 2024-02-25 ENCOUNTER — Encounter: Payer: Self-pay | Admitting: Family Medicine

## 2024-02-25 VITALS — BP 111/76 | HR 96 | Ht 67.0 in | Wt 251.0 lb

## 2024-02-25 DIAGNOSIS — F4310 Post-traumatic stress disorder, unspecified: Secondary | ICD-10-CM

## 2024-02-25 DIAGNOSIS — F419 Anxiety disorder, unspecified: Secondary | ICD-10-CM

## 2024-02-25 DIAGNOSIS — F339 Major depressive disorder, recurrent, unspecified: Secondary | ICD-10-CM | POA: Diagnosis not present

## 2024-02-25 DIAGNOSIS — F32A Depression, unspecified: Secondary | ICD-10-CM

## 2024-02-25 DIAGNOSIS — G43009 Migraine without aura, not intractable, without status migrainosus: Secondary | ICD-10-CM | POA: Diagnosis not present

## 2024-02-25 MED ORDER — BUPROPION HCL ER (XL) 300 MG PO TB24
300.0000 mg | ORAL_TABLET | Freq: Every day | ORAL | 3 refills | Status: AC
Start: 2024-02-25 — End: ?

## 2024-02-25 MED ORDER — TOPIRAMATE 100 MG PO TABS: 150.0000 mg | ORAL_TABLET | Freq: Every evening | ORAL | 3 refills | Status: AC | PRN

## 2024-02-25 MED ORDER — UBRELVY 50 MG PO TABS: 50.0000 mg | ORAL_TABLET | Freq: Every day | ORAL | 2 refills | Status: AC | PRN

## 2024-02-25 NOTE — Progress Notes (Signed)
 BP 111/76   Pulse 96   Ht 5\' 7"  (1.702 m)   Wt 251 lb (113.9 kg)   SpO2 97%   BMI 39.31 kg/m    Subjective:   Patient ID: Kelly Campbell, female    DOB: Feb 06, 1991, 33 y.o.   MRN: [id number]  HPI: Kelly Campbell is a 32 y.o. female presenting on 02/25/2024 for Medical Management of Chronic Issues, Anxiety, and Depression   HPI Depression anxiety and PTSD recheck Patient feels like she is doing okay on her anxiety and depression medicines.  She has been dealing with the loss of her family and her 93 year old cousin who died in an automobile accident just last month and then she also had a loss of her dog that had to put down last week as well.  She has been working with counseling on this and does not feel like her medication is helping her through this process but she does feel like it is weighing on her some.  She does not feel like she needs to change her medications and is going to continue working with counseling through this.    02/25/2024    2:55 PM 10/25/2023    2:50 PM 07/22/2023    2:14 PM 04/21/2023    3:01 PM 03/25/2023    2:51 PM  Depression screen PHQ 2/9  Decreased Interest 1 1 1 1 1   Down, Depressed, Hopeless 1 1 2  0 0  PHQ - 2 Score 2 2 3 1 1   Altered sleeping 2 3 3 2  0  Tired, decreased energy 2 3 3 3 1   Change in appetite 1 1 0 0 1  Feeling bad or failure about yourself  0 0 3 1 0  Trouble concentrating 2 1 3 2 2   Moving slowly or fidgety/restless 1 1 0 1 2  Suicidal thoughts 0 0 0 0 0  PHQ-9 Score 10 11 15 10 7   Difficult doing work/chores Very difficult Very difficult Extremely dIfficult Very difficult Very difficult     Migraine recheck Patient has been having more migraines more frequently and she is always taken the Topamax  to help with the migraines and then she takes the Ubrelvy  to help and the migraines when she gets them and she feels like the Ubrelvy  is still working but she has been a little more stressed and having migraines more often and wonders  if we can do anything to help with that.  Relevant past medical, surgical, family and social history reviewed and updated as indicated. Interim medical history since our last visit reviewed. Allergies and medications reviewed and updated.  Review of Systems  Constitutional:  Negative for chills and fever.  HENT:  Negative for congestion, ear discharge and ear pain.   Eyes:  Negative for redness and visual disturbance.  Respiratory:  Negative for chest tightness and shortness of breath.   Cardiovascular:  Negative for chest pain and leg swelling.  Genitourinary:  Negative for difficulty urinating and dysuria.  Musculoskeletal:  Negative for back pain and gait problem.  Skin:  Negative for rash.  Neurological:  Positive for headaches. Negative for dizziness and light-headedness.  Psychiatric/Behavioral:  Negative for agitation and behavioral problems.   All other systems reviewed and are negative.   Per HPI unless specifically indicated above   Allergies as of 02/25/2024       Reactions   Penicillins         Medication List        Accurate as of  Feb 25, 2024  3:15 PM. If you have any questions, ask your nurse or doctor.          albuterol  108 (90 Base) MCG/ACT inhaler Commonly known as: VENTOLIN  HFA Inhale 1-2 puffs into the lungs every 6 (six) hours as needed for wheezing or shortness of breath.   buPROPion  300 MG 24 hr tablet Commonly known as: Wellbutrin  XL Take 1 tablet (300 mg total) by mouth daily.   busPIRone 7.5 MG tablet Commonly known as: BUSPAR Take 7.5 mg by mouth 3 (three) times daily.   etonogestrel -ethinyl estradiol  0.12-0.015 MG/24HR vaginal ring Commonly known as: EluRyng INSERT 1 RING VAGINALLY AND LEAVE IN PLACE FOR 3 CONSECUTIVE WEEKS THEN REMOVE FOR 1 WEEK   fluticasone  50 MCG/ACT nasal spray Commonly known as: FLONASE  Place 2 sprays into both nostrils daily.   lisdexamfetamine 30 MG capsule Commonly known as: VYVANSE Take 30 mg by mouth  every morning.   sertraline  100 MG tablet Commonly known as: ZOLOFT  Take 200 mg by mouth daily.   topiramate  100 MG tablet Commonly known as: TOPAMAX  Take 1.5 tablets (150 mg total) by mouth at bedtime as needed. What changed: how much to take Changed by: Lucio Sabin Keeven Matty   Ubrelvy  50 MG Tabs Generic drug: Ubrogepant  Take 1 tablet (50 mg total) by mouth daily as needed.   Vitamin D  (Ergocalciferol ) 1.25 MG (50000 UNIT) Caps capsule Commonly known as: DRISDOL  Take 1 capsule by mouth once a week         Objective:   BP 111/76   Pulse 96   Ht 5\' 7"  (1.702 m)   Wt 251 lb (113.9 kg)   SpO2 97%   BMI 39.31 kg/m   Wt Readings from Last 3 Encounters:  02/25/24 251 lb (113.9 kg)  10/25/23 245 lb (111.1 kg)  07/22/23 246 lb (111.6 kg)    Physical Exam Vitals and nursing note reviewed.  Constitutional:      General: She is not in acute distress.    Appearance: She is well-developed. She is not diaphoretic.  Eyes:     Conjunctiva/sclera: Conjunctivae normal.  Cardiovascular:     Rate and Rhythm: Normal rate and regular rhythm.     Heart sounds: Normal heart sounds. No murmur heard. Pulmonary:     Effort: Pulmonary effort is normal. No respiratory distress.     Breath sounds: Normal breath sounds. No wheezing.  Musculoskeletal:        General: No swelling.  Skin:    General: Skin is warm and dry.     Findings: No rash.  Neurological:     Mental Status: She is alert and oriented to person, place, and time.     Coordination: Coordination normal.  Psychiatric:        Behavior: Behavior normal.       Assessment & Plan:   Problem List Items Addressed This Visit       Cardiovascular and Mediastinum   Migraine without aura and without status migrainosus, not intractable   Relevant Medications   buPROPion  (WELLBUTRIN  XL) 300 MG 24 hr tablet   topiramate  (TOPAMAX ) 100 MG tablet   Ubrogepant  (UBRELVY ) 50 MG TABS     Other   Depression, recurrent (HCC) -  Primary   Relevant Medications   buPROPion  (WELLBUTRIN  XL) 300 MG 24 hr tablet   Anxiety and depression   Relevant Medications   buPROPion  (WELLBUTRIN  XL) 300 MG 24 hr tablet   PTSD (post-traumatic stress disorder)   Relevant Medications  buPROPion  (WELLBUTRIN  XL) 300 MG 24 hr tablet    Increase topamax  to 150 mg and continue current medication Follow up plan: Return in about 3 months (around 05/27/2024), or if symptoms worsen or fail to improve, for migraine and anxiety.  Counseling provided for all of the vaccine components No orders of the defined types were placed in this encounter.   Jolyne Needs, MD [facility] 02/25/2024, 3:15 PM

## 2024-03-09 DIAGNOSIS — M5432 Sciatica, left side: Secondary | ICD-10-CM | POA: Diagnosis not present

## 2024-03-09 DIAGNOSIS — S335XXA Sprain of ligaments of lumbar spine, initial encounter: Secondary | ICD-10-CM | POA: Diagnosis not present

## 2024-03-09 DIAGNOSIS — M5431 Sciatica, right side: Secondary | ICD-10-CM | POA: Diagnosis not present

## 2024-03-15 DIAGNOSIS — F331 Major depressive disorder, recurrent, moderate: Secondary | ICD-10-CM | POA: Diagnosis not present

## 2024-03-15 DIAGNOSIS — F909 Attention-deficit hyperactivity disorder, unspecified type: Secondary | ICD-10-CM | POA: Diagnosis not present

## 2024-03-15 DIAGNOSIS — F411 Generalized anxiety disorder: Secondary | ICD-10-CM | POA: Diagnosis not present

## 2024-03-15 DIAGNOSIS — Z5181 Encounter for therapeutic drug level monitoring: Secondary | ICD-10-CM | POA: Diagnosis not present

## 2024-03-22 DIAGNOSIS — M47816 Spondylosis without myelopathy or radiculopathy, lumbar region: Secondary | ICD-10-CM | POA: Diagnosis not present

## 2024-03-22 DIAGNOSIS — G8929 Other chronic pain: Secondary | ICD-10-CM | POA: Diagnosis not present

## 2024-03-22 DIAGNOSIS — M545 Low back pain, unspecified: Secondary | ICD-10-CM | POA: Diagnosis not present

## 2024-04-10 DIAGNOSIS — F909 Attention-deficit hyperactivity disorder, unspecified type: Secondary | ICD-10-CM | POA: Diagnosis not present

## 2024-04-10 DIAGNOSIS — F331 Major depressive disorder, recurrent, moderate: Secondary | ICD-10-CM | POA: Diagnosis not present

## 2024-04-10 DIAGNOSIS — F411 Generalized anxiety disorder: Secondary | ICD-10-CM | POA: Diagnosis not present

## 2024-04-17 ENCOUNTER — Other Ambulatory Visit: Payer: Self-pay | Admitting: Family Medicine

## 2024-04-25 ENCOUNTER — Telehealth: Payer: Self-pay | Admitting: Family Medicine

## 2024-04-25 DIAGNOSIS — Z0279 Encounter for issue of other medical certificate: Secondary | ICD-10-CM

## 2024-04-25 NOTE — Telephone Encounter (Signed)
PT dropped off FMLA forms to be completed and signed.  Form Fee Paid? (Y/N)       YES     If NO, form is placed on front office manager desk to hold until payment received. If YES, then form will be placed in the RX/HH Nurse Coordinators box for completion.  Form will not be processed until payment is received

## 2024-04-27 NOTE — Telephone Encounter (Signed)
 PCP completed and signed FMLA forms. They have been faxed to [employer] at fax number [phone]. Patient has been contacted and informed they are complete. Copy at front desk.

## 2024-05-29 ENCOUNTER — Ambulatory Visit: Admitting: Family Medicine

## 2024-05-29 ENCOUNTER — Encounter: Payer: Self-pay | Admitting: Family Medicine

## 2024-05-29 VITALS — BP 124/87 | HR 87 | Ht 67.0 in | Wt 254.0 lb

## 2024-05-29 DIAGNOSIS — F419 Anxiety disorder, unspecified: Secondary | ICD-10-CM | POA: Diagnosis not present

## 2024-05-29 DIAGNOSIS — F4310 Post-traumatic stress disorder, unspecified: Secondary | ICD-10-CM | POA: Diagnosis not present

## 2024-05-29 DIAGNOSIS — F339 Major depressive disorder, recurrent, unspecified: Secondary | ICD-10-CM | POA: Diagnosis not present

## 2024-05-29 DIAGNOSIS — Z3044 Encounter for surveillance of vaginal ring hormonal contraceptive device: Secondary | ICD-10-CM | POA: Diagnosis not present

## 2024-05-29 DIAGNOSIS — Z3009 Encounter for other general counseling and advice on contraception: Secondary | ICD-10-CM

## 2024-05-29 DIAGNOSIS — M7752 Other enthesopathy of left foot: Secondary | ICD-10-CM

## 2024-05-29 NOTE — Progress Notes (Signed)
 BP 124/87   Pulse 87   Ht 5' 7 (1.702 m)   Wt 254 lb (115.2 kg)   SpO2 96%   BMI 39.78 kg/m    Subjective:   Patient ID: Kelly Campbell, female    DOB: 08/15/1991, 33 y.o.   MRN: [id number]  HPI: Kelly Campbell is a 33 y.o. female presenting on 05/29/2024 for Medical Management of Chronic Issues, Depression, Anxiety, and heal pain   Discussed the use of AI scribe software for clinical note transcription with the patient, who gave verbal consent to proceed.  History of Present Illness   Kelly Campbell is a 33 year old female with depression, anxiety, and PTSD who presents for a recheck and surveillance of NuvaRing.  She is currently taking buspirone, Wellbutrin , and Zoloft  for her mental health conditions. She experiences moments of emotional pain and overstimulation, affecting her interactions with others. The frequency of these episodes varies depending on the situation. 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 No current thoughts of self-harm or suicide.  She is not currently using the NuvaRing and reports irregular menstrual cycles, typically late, which she expected based on her history before starting birth control. She tracks her cycles using an app. Her husband has had a vasectomy, so pregnancy is not a concern. Her periods were initially quite heavy after stopping the NuvaRing, but the most recent cycle was less severe, although still heavy.  She reports experiencing heel pain in her left foot for the past two months. The pain is localized and worsens with certain movements. She has a history of plantar fasciitis and has been doing physical therapy exercises for it. Working on concrete floors may exacerbate the pain. She has previously seen a podiatrist, who noted the presence of bone spurs, though they were not deemed concerning at the time.  Her son is starting  middle school, which is causing some stress at home. He is participating in a therapy program called [facility] and has an IEP meeting scheduled for the beginning of the school year. She has restarted his Vyvanse to help him adjust to the new school environment.          Relevant past medical, surgical, family and social history reviewed and updated as indicated. Interim medical history since our last visit reviewed. Allergies and medications reviewed and updated.  Review of Systems  Constitutional:  Negative for chills and fever.  HENT:  Negative for congestion, ear discharge and ear pain.   Eyes:  Negative for redness and visual disturbance.  Respiratory:  Negative for chest tightness and shortness of breath.   Cardiovascular:  Negative for chest pain and leg swelling.  Genitourinary:  Negative for difficulty urinating and dysuria.  Musculoskeletal:  Positive for arthralgias and myalgias. Negative for back pain and gait problem.  Skin:  Negative for rash.  Neurological:  Negative for light-headedness and headaches.  Psychiatric/Behavioral:  Negative for agitation and behavioral problems.   All other systems reviewed and are negative.   Per HPI unless specifically indicated above   Allergies as of 05/29/2024       Reactions   Penicillins         Medication List        Accurate as of May 29, 2024  3:18 PM. If you have any questions, ask your nurse or doctor.  albuterol  108 (90 Base) MCG/ACT inhaler Commonly known as: VENTOLIN  HFA Inhale 1-2 puffs into the lungs every 6 (six) hours as needed for wheezing or shortness of breath.   buPROPion  300 MG 24 hr tablet Commonly known as: Wellbutrin  XL Take 1 tablet (300 mg total) by mouth daily.   busPIRone 7.5 MG tablet Commonly known as: BUSPAR Take 7.5 mg by mouth 3 (three) times daily.   etonogestrel -ethinyl estradiol  0.12-0.015 MG/24HR vaginal ring Commonly known as: EluRyng INSERT 1 RING VAGINALLY  AND LEAVE IN PLACE FOR 3 CONSECUTIVE WEEKS THEN REMOVE FOR 1 WEEK   fluticasone  50 MCG/ACT nasal spray Commonly known as: FLONASE  Place 2 sprays into both nostrils daily.   lisdexamfetamine 30 MG capsule Commonly known as: VYVANSE Take 30 mg by mouth every morning.   sertraline  100 MG tablet Commonly known as: ZOLOFT  Take 200 mg by mouth daily.   topiramate  100 MG tablet Commonly known as: TOPAMAX  Take 1.5 tablets (150 mg total) by mouth at bedtime as needed.   Ubrelvy  50 MG Tabs Generic drug: Ubrogepant  Take 1 tablet (50 mg total) by mouth daily as needed.   Vitamin D  (Ergocalciferol ) 1.25 MG (50000 UNIT) Caps capsule Commonly known as: DRISDOL  Take 1 capsule by mouth once a week         Objective:   BP 124/87   Pulse 87   Ht 5' 7 (1.702 m)   Wt 254 lb (115.2 kg)   SpO2 96%   BMI 39.78 kg/m   Wt Readings from Last 3 Encounters:  05/29/24 254 lb (115.2 kg)  02/25/24 251 lb (113.9 kg)  10/25/23 245 lb (111.1 kg)    Physical Exam Musculoskeletal:       Feet:    Physical Exam   CHEST: Lungs clear to auscultation bilaterally. CARDIOVASCULAR: Regular heart sounds. MUSCULOSKELETAL: Bursitis in left foot with pain on stretching heel.         Assessment & Plan:   Problem List Items Addressed This Visit       Other   Depression, recurrent (HCC) - Primary   Anxiety and depression   PTSD (post-traumatic stress disorder)   Encounter for surveillance of vaginal ring hormonal contraceptive device   Other Visit Diagnoses       Birth control counseling         Left calcaneal bursitis              Depression, Anxiety Disorder, and Post-Traumatic Stress Disorder (PTSD) Managed with buspirone, Wellbutrin , and Zoloft . Reports overstimulation and stress in situational contexts. No self-harm or suicidal ideation. - Continue buspirone, Wellbutrin , and Zoloft . - Monitor symptoms and adjust treatment as necessary.  Left Heel Bursitis Left heel pain  consistent with bursitis, exacerbated by certain footwear and activities. No Achilles tendon involvement. Differential includes bone spur, but location is classic for bursitis. - Perform daily stretching exercises for the Achilles and calf. - Apply ice to the affected area daily. - Wear appropriate footwear to avoid pressure on the heel. - Consider referral to podiatrist Dr. Alona for possible bursa injection if symptoms persist.  Irregular Menstrual Cycles Irregular cycles noted after discontinuation of NuvaRing. Consistent with pre-birth control patterns. Pregnancy not a concern due to husband's vasectomy. - Monitor menstrual cycles for changes. - Consider reinitiating NuvaRing if irregular cycles become problematic.      Follow up plan: Return in about 3 months (around 08/29/2024), or if symptoms worsen or fail to improve, for Anxiety depression.  Counseling provided for all of the  vaccine components No orders of the defined types were placed in this encounter.   Fonda Levins, MD [facility] 05/29/2024, 3:18 PM

## 2024-06-13 ENCOUNTER — Encounter: Payer: Self-pay | Admitting: Podiatry

## 2024-06-13 ENCOUNTER — Ambulatory Visit (INDEPENDENT_AMBULATORY_CARE_PROVIDER_SITE_OTHER)

## 2024-06-13 ENCOUNTER — Ambulatory Visit (INDEPENDENT_AMBULATORY_CARE_PROVIDER_SITE_OTHER): Admitting: Podiatry

## 2024-06-13 DIAGNOSIS — M722 Plantar fascial fibromatosis: Secondary | ICD-10-CM

## 2024-06-13 DIAGNOSIS — R52 Pain, unspecified: Secondary | ICD-10-CM | POA: Diagnosis not present

## 2024-06-13 MED ORDER — TRIAMCINOLONE ACETONIDE 10 MG/ML IJ SUSP
5.0000 mg | Freq: Once | INTRAMUSCULAR | Status: AC
  Administered 2024-06-13: 5 mg via INTRAMUSCULAR

## 2024-06-13 NOTE — Patient Instructions (Signed)

## 2024-06-14 NOTE — Progress Notes (Signed)
 Subjective:  Patient ID: Kelly Campbell, female    DOB: 17-Nov-1990,  MRN: [id number]  Chief Complaint  Patient presents with   Plantar Fasciitis    Rm11 bilateral heel pain left worse than right/1 month/ice and stretching    Discussed the use of AI scribe software for clinical note transcription with the patient, who gave verbal consent to proceed.  History of Present Illness Kelly Campbell is a 33 year old female who presents with left heel pain.  She has been experiencing left heel pain for about a month, with the left side being worse than the right. The pain is described as a burning sensation localized to the heel, worsening with activity and sometimes accompanied by swelling. Prolonged standing exacerbates the pain. There is no history of recent injuries. She manages the pain with icing and stretching, similar to previous physical therapy regimens. She has experienced similar heel pain episodes in the past, which were managed with exercises and braces. Wearing supportive shoes has helped her manage symptoms for extended periods without medical intervention. She is on her feet all day, which worsens her heel pain.      Objective:    Physical Exam General: AAO x3, NAD  Dermatological: Skin is warm, dry and supple bilateral.  There are no open sores, no preulcerative lesions, no rash or signs of infection present.  Vascular: Dorsalis Pedis artery and Posterior Tibial artery pedal pulses are 2/4 bilateral with immedate capillary fill time.  There is no pain with calf compression, swelling, warmth, erythema.   Neruologic: Grossly intact via light touch bilateral.  Negative tinel sign.  Musculoskeletal: Tenderness to palpation along the plantar medial tubercle of the calcaneus at the insertion of plantar fascia on the left > right foot. There is no pain along the course of the plantar fascia within the arch of the foot. Plantar fascia appears to be intact. There is no pain with lateral  compression of the calcaneus or pain with vibratory sensation. There is no pain along the course or insertion of the achilles tendon. No other areas of tenderness to bilateral lower extremities.  Gait: Unassisted, Nonantalgic.         Results RADIOLOGY Foot X-ray: Normal alignment, mild toe curling, joint space maintained, small heel spur on right heel, small heel spur on left heel.   Assessment:   1. Plantar fasciitis of left foot   2. Plantar fasciitis of right foot      Plan:  Patient was evaluated and treated and all questions answered.  Assessment and Plan Assessment & Plan Bilateral plantar fasciitis Bilateral plantar fasciitis with left side worse than right. Pain due to inflammation around the plantar fascia, not heel spur. X-rays show heel spurs. - Administered steroid injection in both heels. Discussed post-injection care including icing and Tylenol for pain. - Advised daily icing using a frozen water bottle under the heel. - Provided exercise worksheet for rehabilitation. - Supplied plantar fascial braces for support, especially during work. - Ensured use of shoes with good arch support, recommended [employer].  Procedure: Injection Tendon/Ligament Discussed alternatives, risks, complications and verbal consent was obtained.  Location: Bilateral plantar fascia at the glabrous junction; medial approach. Skin Prep: Alcohol. Injectate: 0.5cc 0.5% marcaine plain, 0.5 cc 2% lidocaine plain and, 1 cc kenalog  10. Disposition: Patient tolerated procedure well. Injection site dressed with a band-aid.  Post-injection care was discussed and return precautions discussed.     Return if symptoms worsen or fail to improve.   Donnice  JONELLE Fees DPM

## 2024-06-22 DIAGNOSIS — F411 Generalized anxiety disorder: Secondary | ICD-10-CM | POA: Diagnosis not present

## 2024-06-22 DIAGNOSIS — F909 Attention-deficit hyperactivity disorder, unspecified type: Secondary | ICD-10-CM | POA: Diagnosis not present

## 2024-06-22 DIAGNOSIS — F331 Major depressive disorder, recurrent, moderate: Secondary | ICD-10-CM | POA: Diagnosis not present

## 2024-07-05 DIAGNOSIS — F909 Attention-deficit hyperactivity disorder, unspecified type: Secondary | ICD-10-CM | POA: Diagnosis not present

## 2024-07-05 DIAGNOSIS — F411 Generalized anxiety disorder: Secondary | ICD-10-CM | POA: Diagnosis not present

## 2024-07-05 DIAGNOSIS — F331 Major depressive disorder, recurrent, moderate: Secondary | ICD-10-CM | POA: Diagnosis not present

## 2024-07-25 ENCOUNTER — Other Ambulatory Visit: Payer: Self-pay | Admitting: Family Medicine

## 2024-08-03 DIAGNOSIS — F909 Attention-deficit hyperactivity disorder, unspecified type: Secondary | ICD-10-CM | POA: Diagnosis not present

## 2024-08-03 DIAGNOSIS — F331 Major depressive disorder, recurrent, moderate: Secondary | ICD-10-CM | POA: Diagnosis not present

## 2024-08-03 DIAGNOSIS — F411 Generalized anxiety disorder: Secondary | ICD-10-CM | POA: Diagnosis not present

## 2024-08-30 ENCOUNTER — Ambulatory Visit: Payer: Self-pay | Admitting: Family Medicine

## 2024-08-30 ENCOUNTER — Encounter: Payer: Self-pay | Admitting: Family Medicine

## 2024-08-30 VITALS — BP 117/71 | HR 101 | Temp 97.9°F | Wt 265.0 lb

## 2024-08-30 DIAGNOSIS — F4310 Post-traumatic stress disorder, unspecified: Secondary | ICD-10-CM

## 2024-08-30 DIAGNOSIS — F339 Major depressive disorder, recurrent, unspecified: Secondary | ICD-10-CM | POA: Diagnosis not present

## 2024-08-30 DIAGNOSIS — R310 Gross hematuria: Secondary | ICD-10-CM | POA: Diagnosis not present

## 2024-08-30 DIAGNOSIS — F419 Anxiety disorder, unspecified: Secondary | ICD-10-CM | POA: Diagnosis not present

## 2024-08-30 NOTE — Progress Notes (Signed)
 BP 117/71   Pulse (!) 101   Temp 97.9 F (36.6 C)   Wt 265 lb (120.2 kg)   SpO2 99%   BMI 41.50 kg/m    Subjective:   Patient ID: Kelly Campbell, female    DOB: 1991-06-21, 33 y.o.   MRN: [id number]  HPI: Kelly Campbell is a 33 y.o. female presenting on 08/30/2024 for Medical Management of Chronic Issues, Anxiety, and Depression   Discussed the use of AI scribe software for clinical note transcription with the patient, who gave verbal consent to proceed.  History of Present Illness   Kelly Campbell is a 33 year old female who presents for a three-month checkup to evaluate the effectiveness of Wellbutrin  and Zoloft  for mood and depression.  Mood disturbance and depression - Currently taking Wellbutrin  and Zoloft  for mood and depression - Improved emotional state and mood since starting current medications - Continues regular counseling sessions in psychiatry - No issues or new recommendations from psychiatry  Abdominal pain and hematuria - Severe pain began last Friday morning, initially localized to one side and then migrated to the abdomen - Pain was accompanied by hematuria - Pain subsided by late Saturday afternoon but recurred last night - No prior history of nephrolithiasis - Family history significant for a great-grandfather with kidney stones - Maintaining good hydration with cranberry juice and water in response to symptoms           08/30/2024    4:01 PM 05/29/2024    3:04 PM 02/25/2024    2:55 PM 10/25/2023    2:50 PM 07/22/2023    2:14 PM  Depression screen PHQ 2/9  Decreased Interest 1 1 1 1 1   Down, Depressed, Hopeless 1 0 1 1 2   PHQ - 2 Score 2 1 2 2 3   Altered sleeping 1 2 2 3 3   Tired, decreased energy 1 3 2 3 3   Change in appetite 1 0 1 1 0  Feeling bad or failure about yourself  1 1 0 0 3  Trouble concentrating 1 1 2 1 3   Moving slowly or fidgety/restless 0 1 1 1  0  Suicidal thoughts 0 0 0 0 0  PHQ-9 Score 7 9  10  11  15    Difficult doing  work/chores Somewhat difficult Very difficult Very difficult Very difficult Extremely dIfficult     Data saved with a previous flowsheet row definition      Relevant past medical, surgical, family and social history reviewed and updated as indicated. Interim medical history since our last visit reviewed. Allergies and medications reviewed and updated.  Review of Systems  Constitutional:  Negative for chills and fever.  Eyes:  Negative for visual disturbance.  Respiratory:  Negative for chest tightness and shortness of breath.   Cardiovascular:  Negative for chest pain and leg swelling.  Gastrointestinal:  Negative for abdominal pain, constipation, diarrhea, nausea and vomiting.  Genitourinary:  Positive for dysuria, hematuria and urgency. Negative for decreased urine volume, difficulty urinating, vaginal bleeding, vaginal discharge and vaginal pain.  Musculoskeletal:  Negative for back pain and gait problem.  Skin:  Negative for rash.  Neurological:  Negative for dizziness, light-headedness and headaches.  Psychiatric/Behavioral:  Negative for agitation and behavioral problems.   All other systems reviewed and are negative.   Per HPI unless specifically indicated above   Allergies as of 08/30/2024       Reactions   Penicillins         Medication List  Accurate as of August 30, 2024  4:16 PM. If you have any questions, ask your nurse or doctor.          albuterol  108 (90 Base) MCG/ACT inhaler Commonly known as: VENTOLIN  HFA Inhale 1-2 puffs into the lungs every 6 (six) hours as needed for wheezing or shortness of breath.   buPROPion  300 MG 24 hr tablet Commonly known as: Wellbutrin  XL Take 1 tablet (300 mg total) by mouth daily.   busPIRone 7.5 MG tablet Commonly known as: BUSPAR Take 7.5 mg by mouth 3 (three) times daily.   fluticasone  50 MCG/ACT nasal spray Commonly known as: FLONASE  Place 2 sprays into both nostrils daily.   lisdexamfetamine 30 MG  capsule Commonly known as: VYVANSE Take 30 mg by mouth every morning.   sertraline  100 MG tablet Commonly known as: ZOLOFT  Take 200 mg by mouth daily.   topiramate  100 MG tablet Commonly known as: TOPAMAX  Take 1.5 tablets (150 mg total) by mouth at bedtime as needed.   Ubrelvy  50 MG Tabs Generic drug: Ubrogepant  Take 1 tablet (50 mg total) by mouth daily as needed.   Vitamin D  (Ergocalciferol ) 1.25 MG (50000 UNIT) Caps capsule Commonly known as: DRISDOL  Take 1 capsule by mouth once a week         Objective:   BP 117/71   Pulse (!) 101   Temp 97.9 F (36.6 C)   Wt 265 lb (120.2 kg)   SpO2 99%   BMI 41.50 kg/m   Wt Readings from Last 3 Encounters:  08/30/24 265 lb (120.2 kg)  05/29/24 254 lb (115.2 kg)  02/25/24 251 lb (113.9 kg)    Physical Exam Physical Exam   CHEST: Lungs clear to auscultation bilaterally. CARDIOVASCULAR: Heart regular rate and rhythm. ABDOMEN: Abdomen non-tender. SKIN: No rash.         Assessment & Plan:   Problem List Items Addressed This Visit       Other   Depression, recurrent - Primary   Anxiety and depression   PTSD (post-traumatic stress disorder)   Other Visit Diagnoses       Gross hematuria       Relevant Orders   Urinalysis, Complete   Urine Culture          Depression and anxiety disorder Well-managed with Wellbutrin  and Zoloft . No issues with psychiatric counseling. - Continue Wellbutrin  and Zoloft . - Continue psychiatric counseling.  Hematuria and possible nephrolithiasis Intermittent flank pain and hematuria suggest nephrolithiasis. Differential includes nephrolithiasis and UTI. Urinalysis and CT scan for diagnosis. - Obtained urine sample for urinalysis to check for hematuria, crystals, and infection. - Encouraged increased hydration to flush out potential stone. - Advised to contact if urinalysis results indicate significant findings.       Follow up plan: Return in about 3 months (around  11/30/2024), or if symptoms worsen or fail to improve, for Physical exam.  Counseling provided for all of the vaccine components Orders Placed This Encounter  Procedures   Urine Culture   Urinalysis, Complete    Fonda Levins, MD [facility] 08/30/2024, 4:16 PM

## 2024-09-01 ENCOUNTER — Ambulatory Visit: Payer: Self-pay | Admitting: Family Medicine

## 2024-09-01 DIAGNOSIS — R0602 Shortness of breath: Secondary | ICD-10-CM | POA: Diagnosis not present

## 2024-09-01 DIAGNOSIS — R0789 Other chest pain: Secondary | ICD-10-CM | POA: Diagnosis not present

## 2024-09-01 DIAGNOSIS — R079 Chest pain, unspecified: Secondary | ICD-10-CM | POA: Diagnosis not present

## 2024-09-01 LAB — URINALYSIS, COMPLETE
Glucose, UA: NEGATIVE
Leukocytes,UA: NEGATIVE
Nitrite, UA: NEGATIVE
Specific Gravity, UA: >=1.03 — AB (ref 1.005–1.030)
Urobilinogen, Ur: 0.2 mg/dL (ref 0.2–1.0)
pH, UA: 6 (ref 5.0–7.5)

## 2024-09-01 LAB — URINE CULTURE

## 2024-09-05 ENCOUNTER — Encounter (HOSPITAL_COMMUNITY): Payer: Self-pay

## 2024-09-05 ENCOUNTER — Emergency Department (HOSPITAL_COMMUNITY)
Admission: EM | Admit: 2024-09-05 | Discharge: 2024-09-06 | Disposition: A | Discharge status: Home / Self Care | Attending: Emergency Medicine | Admitting: Emergency Medicine

## 2024-09-05 ENCOUNTER — Other Ambulatory Visit: Payer: Self-pay

## 2024-09-05 DIAGNOSIS — M545 Low back pain, unspecified: Secondary | ICD-10-CM | POA: Diagnosis not present

## 2024-09-05 DIAGNOSIS — K8020 Calculus of gallbladder without cholecystitis without obstruction: Secondary | ICD-10-CM | POA: Diagnosis not present

## 2024-09-05 DIAGNOSIS — N200 Calculus of kidney: Secondary | ICD-10-CM | POA: Diagnosis not present

## 2024-09-05 DIAGNOSIS — M5126 Other intervertebral disc displacement, lumbar region: Secondary | ICD-10-CM | POA: Diagnosis not present

## 2024-09-05 LAB — URINALYSIS, ROUTINE W REFLEX MICROSCOPIC
Bilirubin Urine: NEGATIVE
Glucose, UA: NEGATIVE mg/dL
Ketones, ur: NEGATIVE mg/dL
Nitrite: NEGATIVE
Protein, ur: 30 mg/dL — AB
RBC / HPF: >50 RBC/hpf (ref 0–5)
Specific Gravity, Urine: 1.028 (ref 1.005–1.030)
pH: 5 (ref 5.0–8.0)

## 2024-09-05 LAB — CBC
HCT: 34.3 % — ABNORMAL LOW (ref 36.0–46.0)
Hemoglobin: 10.9 g/dL — ABNORMAL LOW (ref 12.0–15.0)
MCH: 25.7 pg — ABNORMAL LOW (ref 26.0–34.0)
MCHC: 31.8 g/dL (ref 30.0–36.0)
MCV: 80.9 fL (ref 80.0–100.0)
Platelets: 307 K/uL (ref 150–400)
RBC: 4.24 MIL/uL (ref 3.87–5.11)
RDW: 14.6 % (ref 11.5–15.5)
WBC: 8.2 K/uL (ref 4.0–10.5)
nRBC: 0 % (ref 0.0–0.2)

## 2024-09-05 LAB — COMPREHENSIVE METABOLIC PANEL WITH GFR
ALT: 31 U/L (ref 0–44)
AST: 26 U/L (ref 15–41)
Albumin: 3.4 g/dL — ABNORMAL LOW (ref 3.5–5.0)
Alkaline Phosphatase: 77 U/L (ref 38–126)
Anion gap: 11 (ref 5–15)
BUN: 6 mg/dL (ref 6–20)
CO2: 22 mmol/L (ref 22–32)
Calcium: 9.1 mg/dL (ref 8.9–10.3)
Chloride: 109 mmol/L (ref 98–111)
Creatinine, Ser: 0.84 mg/dL (ref 0.44–1.00)
GFR, Estimated: >60 mL/min (ref >60)
Glucose, Bld: 93 mg/dL (ref 70–99)
Potassium: 3.3 mmol/L — ABNORMAL LOW (ref 3.5–5.1)
Sodium: 142 mmol/L (ref 135–145)
Total Bilirubin: 0.2 mg/dL (ref 0.0–1.2)
Total Protein: 6.8 g/dL (ref 6.5–8.1)

## 2024-09-05 LAB — HCG, SERUM, QUALITATIVE: Preg, Serum: NEGATIVE

## 2024-09-05 LAB — LIPASE, BLOOD: Lipase: 43 U/L (ref 11–51)

## 2024-09-05 NOTE — ED Triage Notes (Signed)
 Pt coming in for mid back pain to the right side to her lower back. Pt reports that is it constant. Pt reporting abdominal pain . Pt report that she started her period today. Pt reports normal bleeding. Pt saturating tampon and pad approximately every 2 hours. Pt reports that this morning she was in 10/10 pain. Pt reporting nausea no vomiting.

## 2024-09-05 NOTE — ED Provider Triage Note (Signed)
 Emergency Medicine Provider Triage Evaluation Note  Joice Nazario , a 33 y.o. female  was evaluated in triage.  Pt complains of abd pain. Report pain to lower abdomen and R flank that started earlier today.  Also notice blood in urine as well as starting her menstruation.  Hx of kidney stone.  Hx of preeclampsia.  No fever, n/v/d  Review of Systems  Positive: As above Negative: As above  Physical Exam  BP (!) 150/103 (BP Location: Left Arm)   Pulse 98   Temp 98.3 F (36.8 C)   Resp 18   Ht 5' 8 (1.727 m)   Wt 113.4 kg   LMP 09/05/2024 (Exact Date)   SpO2 100%   BMI 38.01 kg/m  Gen:   Awake, no distress   Resp:  Normal effort  MSK:   Moves extremities without difficulty  Other:  R CVA tenderness  Medical Decision Making  Medically screening exam initiated at 8:07 PM.  Appropriate orders placed.  Kari Montero was informed that the remainder of the evaluation will be completed by another provider, this initial triage assessment does not replace that evaluation, and the importance of remaining in the ED until their evaluation is complete.     Nivia Colon, PA-C 09/05/24 2009

## 2024-09-06 ENCOUNTER — Emergency Department (HOSPITAL_COMMUNITY)

## 2024-09-06 DIAGNOSIS — K8020 Calculus of gallbladder without cholecystitis without obstruction: Secondary | ICD-10-CM | POA: Diagnosis not present

## 2024-09-06 DIAGNOSIS — N134 Hydroureter: Secondary | ICD-10-CM | POA: Diagnosis not present

## 2024-09-06 DIAGNOSIS — N201 Calculus of ureter: Secondary | ICD-10-CM | POA: Diagnosis not present

## 2024-09-06 MED ORDER — OXYCODONE-ACETAMINOPHEN 5-325 MG PO TABS: 1.0000 | ORAL_TABLET | Freq: Four times a day (QID) | ORAL | 0 refills | Status: AC | PRN

## 2024-09-06 MED ORDER — KETOROLAC TROMETHAMINE 30 MG/ML IJ SOLN
30.0000 mg | Freq: Once | INTRAMUSCULAR | Status: AC
  Administered 2024-09-06: 30 mg via INTRAVENOUS
  Filled 2024-09-06: qty 1

## 2024-09-06 MED ORDER — ONDANSETRON 4 MG PO TBDP: 4.0000 mg | ORAL_TABLET | Freq: Three times a day (TID) | ORAL | 0 refills | Status: AC | PRN

## 2024-09-06 MED ORDER — TAMSULOSIN HCL 0.4 MG PO CAPS: 0.4000 mg | ORAL_CAPSULE | Freq: Two times a day (BID) | ORAL | 0 refills | Status: AC

## 2024-09-06 NOTE — Discharge Instructions (Signed)
 Please call the urology group this morning and schedule an appointment for Monday.  If your symptoms change or worsen, return to the ER.

## 2024-09-06 NOTE — ED Notes (Signed)
 Pt verbalized understanding of discharge instructions.

## 2024-09-06 NOTE — ED Provider Notes (Signed)
 [facility]PT [facility] Emergency Department Provider Note MRN:  [id number]  Arrival date & time: 09/06/24     Chief Complaint   Back Pain and Abdominal Pain   History of Present Illness   Kelly Campbell is a 33 y.o. year-old female presents to the ED with chief complaint of right sided low back pain. Denies any radiating pain.  Denies any dysuria.  States that she started her menstrual cycle today.  States that she had some flank pain recently and assumed that it was kidney stones.  States that she has had some nausea.  Denies vomiting. Denies fever or chills.  States that a heating pad helps.  States that the pain had been constant until recently.  SABRA  History provided by patient.   Review of Systems  Pertinent positive and negative review of systems noted in HPI.    Physical Exam   Vitals:   09/05/24 1953 09/05/24 2317  BP: (!) 150/103 134/85  Pulse: 98 91  Resp: 18 (!) 24  Temp: 98.3 F (36.8 C) 98.4 F (36.9 C)  SpO2: 100% 99%    CONSTITUTIONAL:  non toxic-appearing, NAD NEURO:  Alert and oriented x 3, CN 3-12 grossly intact EYES:  eyes equal and reactive ENT/NECK:  Supple, no stridor  CARDIO:  normal rate, regular rhythm, appears well-perfused  PULM:  No respiratory distress, CTAB GI/GU:  non-distended,  MSK/SPINE:  No gross deformities, no edema, moves all extremities  SKIN:  no rash, atraumatic   *Additional and/or pertinent findings included in MDM below  Diagnostic and Interventional Summary    EKG Interpretation Date/Time:    Ventricular Rate:    PR Interval:    QRS Duration:    QT Interval:    QTC Calculation:   R Axis:      Text Interpretation:         Labs Reviewed  COMPREHENSIVE METABOLIC PANEL WITH GFR - Abnormal; Notable for the following components:      Result Value   Potassium 3.3 (*)    Albumin 3.4 (*)    All other components within normal limits  CBC - Abnormal; Notable for the following components:   Hemoglobin  10.9 (*)    HCT 34.3 (*)    MCH 25.7 (*)    All other components within normal limits  URINALYSIS, ROUTINE W REFLEX MICROSCOPIC - Abnormal; Notable for the following components:   APPearance HAZY (*)    Hgb urine dipstick LARGE (*)    Protein, ur 30 (*)    Leukocytes,Ua TRACE (*)    Bacteria, UA RARE (*)    All other components within normal limits  LIPASE, BLOOD  HCG, SERUM, QUALITATIVE    CT Renal Stone Study  Final Result      Medications  ketorolac  (TORADOL ) 30 MG/ML injection 30 mg (30 mg Intravenous Given 09/06/24 0055)     Procedures  /  Critical Care Procedures  ED Course and Medical Decision Making  I have reviewed the triage vital signs, the nursing notes, and pertinent available records from the EMR.  Social Determinants Affecting Complexity of Care: Patient has no clinically significant social determinants affecting this chief complaint..   ED Course:    Medical Decision Making Patient here with right-sided low back pain.  She had some pain on the right flank a week or so ago.  She is concerned about kidney stone.  She does have reassuring vitals.  Her pain is fairly well-controlled now.  Labs are notable for  mild hypokalemia.  Normal lipase, doubt pancreatitis.  Normal LFTs, doubt gallbladder etiology.  No leukocytosis.  She has mild anemia.  Will have her follow-up with PCP for this, but might be secondary to menstruation.  Pregnancy test is negative, doubt ectopic pregnancy.  Urinalysis is notable for some red blood cells, this could be due to her menstruation or possibly kidney stone.  No white blood cells.  No evidence to suggest infection.  I recommend that we proceed with CT imaging to rule out kidney stone.  She is agreeable with this plan.  Will treat with IV Toradol  in the meantime.  CT is consistent with a 5 x 5 x 4 mm distal right ureteral stone.  She does not have any evidence of infection on her UA.  She is afebrile.  No leukocytosis.  Doubt septic  stone.  She has a herniated disc and gallstone in the gallbladder, but without evidence of cholecystitis.  I discussed these findings with the patient.  She is well pain controlled.  She is requesting discharge.  Will have her follow-up with urology.  Amount and/or Complexity of Data Reviewed Labs: ordered. Radiology: ordered.  Risk Prescription drug management.         Consultants: No consultations were needed in caring for this patient.   Treatment and Plan: I considered admission due to patient's initial presentation, but after considering the examination and diagnostic results, patient will not require admission and can be discharged with outpatient follow-up.    Final Clinical Impressions(s) / ED Diagnoses     ICD-10-CM   1. Kidney stone  N20.0     2. Calculus of gallbladder without cholecystitis without obstruction  K80.20     3. 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  If your symptoms change or worsen, return to the ER.       Vicky Charleston, PA-C 09/06/24 9787    Emil Share, DO 09/06/24 9768

## 2024-09-08 ENCOUNTER — Emergency Department (HOSPITAL_COMMUNITY)
Admission: EM | Admit: 2024-09-08 | Discharge: 2024-09-08 | Disposition: A | Discharge status: Home / Self Care | Attending: Emergency Medicine | Admitting: Emergency Medicine

## 2024-09-08 ENCOUNTER — Other Ambulatory Visit: Payer: Self-pay

## 2024-09-08 ENCOUNTER — Encounter (HOSPITAL_COMMUNITY): Payer: Self-pay | Admitting: *Deleted

## 2024-09-08 DIAGNOSIS — R109 Unspecified abdominal pain: Secondary | ICD-10-CM | POA: Diagnosis not present

## 2024-09-08 DIAGNOSIS — N200 Calculus of kidney: Secondary | ICD-10-CM | POA: Diagnosis not present

## 2024-09-08 LAB — URINALYSIS, ROUTINE W REFLEX MICROSCOPIC
Bilirubin Urine: NEGATIVE
Glucose, UA: NEGATIVE mg/dL
Ketones, ur: NEGATIVE mg/dL
Nitrite: NEGATIVE
Protein, ur: NEGATIVE mg/dL
RBC / HPF: >50 RBC/hpf (ref 0–5)
Specific Gravity, Urine: 1.018 (ref 1.005–1.030)
pH: 6 (ref 5.0–8.0)

## 2024-09-08 MED ORDER — KETOROLAC TROMETHAMINE 30 MG/ML IJ SOLN
30.0000 mg | Freq: Once | INTRAMUSCULAR | Status: AC
  Administered 2024-09-08: 30 mg via INTRAVENOUS
  Filled 2024-09-08: qty 1

## 2024-09-08 MED ORDER — HYDROMORPHONE HCL 1 MG/ML IJ SOLN
1.0000 mg | Freq: Once | INTRAMUSCULAR | Status: AC
  Administered 2024-09-08: 1 mg via INTRAVENOUS
  Filled 2024-09-08: qty 1

## 2024-09-08 NOTE — ED Provider Notes (Signed)
 [place] EMERGENCY DEPARTMENT AT [facility] Provider Note   CSN: [id number] Arrival date & time: 09/08/24  1658     Patient presents with: Flank Pain   Allea Kassner is a 33 y.o. female.    Flank Pain   This patient is a 33 year old female, she has a history of a recent kidney stone diagnosed several days ago in the emergency department, she has had right sided abdominal pain and flank pain going on for about a week with associated nausea, she has not had any dysuria hematuria.  Has been taking medications including initial dose of tamsulosin  as well as 2 doses of Percocet today without significant relief.  She does have a follow-up with urology on Monday    Prior to Admission medications   Medication Sig Start Date End Date Taking? Authorizing Provider  albuterol  (VENTOLIN  HFA) 108 (90 Base) MCG/ACT inhaler Inhale 1-2 puffs into the lungs every 6 (six) hours as needed for wheezing or shortness of breath. 04/03/22   Dettinger, Fonda LABOR, MD  buPROPion  (WELLBUTRIN  XL) 300 MG 24 hr tablet Take 1 tablet (300 mg total) by mouth daily. 02/25/24   Dettinger, Fonda LABOR, MD  busPIRone (BUSPAR) 7.5 MG tablet Take 7.5 mg by mouth 3 (three) times daily.    [provider]  fluticasone  (FLONASE ) 50 MCG/ACT nasal spray Place 2 sprays into both nostrils daily. 01/16/21   Joesph Annabella HERO, FNP  lisdexamfetamine (VYVANSE) 30 MG capsule Take 30 mg by mouth every morning. 09/01/23   [provider]  ondansetron  (ZOFRAN -ODT) 4 MG disintegrating tablet Take 1 tablet (4 mg total) by mouth every 8 (eight) hours as needed for nausea or vomiting. 09/06/24   Vicky Charleston, PA-C  oxyCODONE -acetaminophen  (PERCOCET) 5-325 MG tablet Take 1-2 tablets by mouth every 6 (six) hours as needed. 09/06/24   Vicky Charleston, PA-C  sertraline  (ZOLOFT ) 100 MG tablet Take 200 mg by mouth daily.    [provider]  tamsulosin  (FLOMAX ) 0.4 MG CAPS capsule Take 1 capsule (0.4 mg total) by  mouth 2 (two) times daily. 09/06/24   Vicky Charleston, PA-C  topiramate  (TOPAMAX ) 100 MG tablet Take 1.5 tablets (150 mg total) by mouth at bedtime as needed. 02/25/24   Dettinger, Fonda LABOR, MD  Ubrogepant  (UBRELVY ) 50 MG TABS Take 1 tablet (50 mg total) by mouth daily as needed. 02/25/24   Dettinger, Fonda LABOR, MD  Vitamin D , Ergocalciferol , (DRISDOL ) 1.25 MG (50000 UNIT) CAPS capsule Take 1 capsule by mouth once a week 07/26/24   Dettinger, Fonda LABOR, MD    Allergies: Penicillins    Review of Systems  Genitourinary:  Positive for flank pain.  All other systems reviewed and are negative.   Updated Vital Signs BP 128/68   Pulse 79   Temp 97.8 F (36.6 C) (Temporal)   Resp 18   Ht 1.727 m (5' 8)   Wt 113.4 kg   LMP 09/05/2024 (Exact Date)   SpO2 98%   BMI 38.01 kg/m   Physical Exam Vitals and nursing note reviewed.  Constitutional:      General: She is not in acute distress.    Appearance: She is well-developed.  HENT:     Head: Normocephalic and atraumatic.     Mouth/Throat:     Pharynx: No oropharyngeal exudate.  Eyes:     General: No scleral icterus.       Right eye: No discharge.        Left eye: No discharge.  Conjunctiva/sclera: Conjunctivae normal.     Pupils: Pupils are equal, round, and reactive to light.  Neck:     Thyroid : No thyromegaly.     Vascular: No JVD.  Cardiovascular:     Rate and Rhythm: Normal rate and regular rhythm.     Heart sounds: Normal heart sounds. No murmur heard.    No friction rub. No gallop.  Pulmonary:     Effort: Pulmonary effort is normal. No respiratory distress.     Breath sounds: Normal breath sounds. No wheezing or rales.  Abdominal:     General: Bowel sounds are normal. There is no distension.     Palpations: Abdomen is soft. There is no mass.     Tenderness: There is no abdominal tenderness.  Musculoskeletal:        General: No tenderness. Normal range of motion.     Cervical back: Normal range of motion and neck  supple.     Right lower leg: No edema.     Left lower leg: No edema.  Lymphadenopathy:     Cervical: No cervical adenopathy.  Skin:    General: Skin is warm and dry.     Findings: No erythema or rash.  Neurological:     Mental Status: She is alert.     Coordination: Coordination normal.  Psychiatric:        Behavior: Behavior normal.     (all labs ordered are listed, but only abnormal results are displayed) Labs Reviewed  URINALYSIS, ROUTINE W REFLEX MICROSCOPIC - Abnormal; Notable for the following components:      Result Value   Hgb urine dipstick MODERATE (*)    Leukocytes,Ua TRACE (*)    Bacteria, UA RARE (*)    All other components within normal limits    EKG: None  Radiology: No results found.   Procedures   Medications Ordered in the ED  HYDROmorphone  (DILAUDID ) injection 1 mg (1 mg Intravenous Given 09/08/24 1757)  ketorolac  (TORADOL ) 30 MG/ML injection 30 mg (30 mg Intravenous Given 09/08/24 1757)                                    Medical Decision Making Amount and/or Complexity of Data Reviewed Labs: ordered.  Risk Prescription drug management.   Vitals unremarkable, nontender abdomen, I reviewed the CT scan with the patient at the bedside including the images showing the distal ureteral calculus, approximately 5 mm in diameter, patient otherwise appears hemodynamically stable, rule out UTI, give pain medications, she already has established follow-up.  Does not appear septic  Meds / Interventions: while in the ED the patient received the following: Toradol  and hydromorphone  The response to the interventions was that the patient significant improvement  Labs:  I  personally viewed and interpreted the labs which show urinalysis with hematuria but no signs of significant infection  I have discussed with the patient at the bedside the results, and the meaning of these results.  They have had opportunity to ask questions,  expressed their understanding  to the need for follow-up with primary care physician  Stable for discharge, has follow-up Monday, has medicines for home       Final diagnoses:  Kidney stone on right side    ED Discharge Orders     None          Cleotilde Rogue, MD 09/08/24 [place]

## 2024-09-08 NOTE — Discharge Instructions (Signed)
 Please continue the tamsulosin  and the other medication that you are prescribed, keep your appointment on Monday, thankfully there is no signs of infection in the kidneys, you will likely still have some intermittent pain and nausea as this kidney stone continues to move.  Thank you for allowing us  to treat you in the emergency department today.  After reviewing your examination and potential testing that was done it appears that you are safe to go home.  I would like for you to follow-up with your doctor within the next several days, have them obtain your records and follow-up with them to review all potential tests and results from your visit.  If you should develop severe or worsening symptoms return to the emergency department immediately

## 2024-09-08 NOTE — ED Triage Notes (Signed)
 Pt with right flank pain, seen at [facility] Tuesday night and dx with kidney stone. Pt with N/V and states pain med Percocet is not helping with her pain.

## 2024-09-11 DIAGNOSIS — N200 Calculus of kidney: Secondary | ICD-10-CM | POA: Diagnosis not present

## 2024-09-11 DIAGNOSIS — N132 Hydronephrosis with renal and ureteral calculous obstruction: Secondary | ICD-10-CM | POA: Diagnosis not present

## 2024-09-13 DIAGNOSIS — F331 Major depressive disorder, recurrent, moderate: Secondary | ICD-10-CM | POA: Diagnosis not present

## 2024-09-13 DIAGNOSIS — F909 Attention-deficit hyperactivity disorder, unspecified type: Secondary | ICD-10-CM | POA: Diagnosis not present

## 2024-09-13 DIAGNOSIS — F411 Generalized anxiety disorder: Secondary | ICD-10-CM | POA: Diagnosis not present

## 2024-09-26 DIAGNOSIS — N201 Calculus of ureter: Secondary | ICD-10-CM | POA: Diagnosis not present

## 2024-10-02 DIAGNOSIS — F411 Generalized anxiety disorder: Secondary | ICD-10-CM | POA: Diagnosis not present

## 2024-10-02 DIAGNOSIS — F331 Major depressive disorder, recurrent, moderate: Secondary | ICD-10-CM | POA: Diagnosis not present

## 2024-10-02 DIAGNOSIS — F909 Attention-deficit hyperactivity disorder, unspecified type: Secondary | ICD-10-CM | POA: Diagnosis not present

## 2024-12-01 ENCOUNTER — Encounter: Admitting: Family Medicine
